# Patient Record
Sex: Female | Born: 1990 | Race: White | Hispanic: No | Marital: Married | Smoking: Never smoker
Health system: Southern US, Community
[De-identification: ages and names within clinical notes are randomized; demographics above are authoritative.]

## PROBLEM LIST (undated history)

## (undated) ENCOUNTER — Inpatient Hospital Stay (HOSPITAL_COMMUNITY): Payer: Self-pay

## (undated) DIAGNOSIS — F419 Anxiety disorder, unspecified: Secondary | ICD-10-CM

## (undated) DIAGNOSIS — R7303 Prediabetes: Secondary | ICD-10-CM

## (undated) DIAGNOSIS — G43909 Migraine, unspecified, not intractable, without status migrainosus: Secondary | ICD-10-CM

## (undated) DIAGNOSIS — E282 Polycystic ovarian syndrome: Secondary | ICD-10-CM

## (undated) DIAGNOSIS — K219 Gastro-esophageal reflux disease without esophagitis: Secondary | ICD-10-CM

## (undated) DIAGNOSIS — I4729 Other ventricular tachycardia: Secondary | ICD-10-CM

## (undated) DIAGNOSIS — E785 Hyperlipidemia, unspecified: Secondary | ICD-10-CM

## (undated) DIAGNOSIS — N809 Endometriosis, unspecified: Secondary | ICD-10-CM

## (undated) DIAGNOSIS — G4730 Sleep apnea, unspecified: Secondary | ICD-10-CM

## (undated) DIAGNOSIS — J039 Acute tonsillitis, unspecified: Secondary | ICD-10-CM

## (undated) DIAGNOSIS — Z8639 Personal history of other endocrine, nutritional and metabolic disease: Secondary | ICD-10-CM

## (undated) DIAGNOSIS — Z8742 Personal history of other diseases of the female genital tract: Secondary | ICD-10-CM

## (undated) DIAGNOSIS — G4733 Obstructive sleep apnea (adult) (pediatric): Secondary | ICD-10-CM

## (undated) DIAGNOSIS — Z973 Presence of spectacles and contact lenses: Secondary | ICD-10-CM

## (undated) DIAGNOSIS — I10 Essential (primary) hypertension: Secondary | ICD-10-CM

## (undated) DIAGNOSIS — F32A Depression, unspecified: Secondary | ICD-10-CM

## (undated) DIAGNOSIS — E039 Hypothyroidism, unspecified: Secondary | ICD-10-CM

## (undated) HISTORY — DX: Endometriosis, unspecified: N80.9

## (undated) HISTORY — DX: Gastro-esophageal reflux disease without esophagitis: K21.9

## (undated) HISTORY — DX: Sleep apnea, unspecified: G47.30

## (undated) HISTORY — DX: Essential (primary) hypertension: I10

## (undated) HISTORY — DX: Depression, unspecified: F32.A

## (undated) HISTORY — DX: Hyperlipidemia, unspecified: E78.5

---

## 2017-08-21 DIAGNOSIS — Q519 Congenital malformation of uterus and cervix, unspecified: Secondary | ICD-10-CM | POA: Insufficient documentation

## 2017-08-21 DIAGNOSIS — E282 Polycystic ovarian syndrome: Secondary | ICD-10-CM | POA: Insufficient documentation

## 2017-08-21 DIAGNOSIS — N946 Dysmenorrhea, unspecified: Secondary | ICD-10-CM | POA: Insufficient documentation

## 2017-09-27 HISTORY — PX: ROBOTIC ASSISTED DIAGNOSTIC LAPAROSCOPY: SHX6532

## 2018-04-22 DIAGNOSIS — R7989 Other specified abnormal findings of blood chemistry: Secondary | ICD-10-CM | POA: Insufficient documentation

## 2020-02-02 LAB — HM PAP SMEAR

## 2020-04-03 NOTE — L&D Delivery Note (Addendum)
Delivery Note:   Angelica Spencer G2P1001 at [redacted]w[redacted]d  Admitting diagnosis: Gestational hypertension [O13.9] Risks:  1. Gestational hypertension early term, no hx PEC 2. Hx hypothyroid stable off meds, serial TFT's wnl 3. Anxiety, stable on Celexa 40 mg 4. Rh negative, Rhogam given 06/28/20 5. BMI > 30 6. Hx uterine septum resection  First Stage:  Induction of labor: 09/02/2020 starting at 1830 Augmentation: AROM, Cytotec and IP Foley ROM: AROM @ 0420 clear fluid Active labor onset: 09/03/2020 @ 0415 Analgesia /Anesthesia/Pain control intrapartum: Epidural   Second Stage:  Complete dilation at 09/03/2020  @ 0540 Onset of pushing at 0608 FHR second stage Category I   SNM called to bedside pt feeling pressure and desires to push. With great meternal effort fetal head crowned up to +4 station. The following push fetal head delivered with ease, and shoulders and remaining fetal body delivered. Vigorously crying infant placed immediately S2S with mom. Pushing in lithotomy position with SNM, CNM and L&D staff support at bedside encouraging. FOB "Wilber Oliphant" present for birth and supportive.  Nuchal Cord: No  Delivery of a Live born female  Birth Weight: 3521 g Weight:, English: 7 lb 12.2 oz  APGAR: 9, 9  Newborn Delivery   Birth date/time: 09/03/2020 06:13:00 Delivery type: Vaginal, Spontaneous     Infant delivered in cephalic presentation, in DOA position andROA restituted to  position.  Cord double clamped after cessation of pulsation by SNM, cut by FOB "Caleb".  Collection of cord blood for typing completed. Cord blood donation-None  Arterial cord blood sample-No    Third Stage:  Placenta expressed with gentle cord traction and LUS massage-Intact 3 vessels  Uterine tone firm bleeding minimal without clots Uterotonics: IV pitocin Placenta to L&D for disposal.  None  laceration identified.  Episiotomy:None  Local analgesia: n/a  Repair:n/a Est. Blood Loss (mL):100.00    Complications: None   Mom to postpartum.  Baby boy "[name]" to Couplet care / Skin to Skin.   Delivery Report:  Review the Delivery Report for details.     Signed: Royetta Asal), BSN, RNC-OB, Student Nurse Midwife 09/03/2020, 6:46 AM  Medical screening examination/treatment/procedure(s) were conducted as a shared visit with non-physician practitioner(s) and myself.  I personally evaluated the patient during the encounter.   Neta Mends, CNM, MSN 09/03/2020, 10:13 PM

## 2020-05-12 DIAGNOSIS — F411 Generalized anxiety disorder: Secondary | ICD-10-CM | POA: Diagnosis not present

## 2020-06-23 DIAGNOSIS — Z3A27 27 weeks gestation of pregnancy: Secondary | ICD-10-CM | POA: Diagnosis not present

## 2020-06-23 DIAGNOSIS — Z3482 Encounter for supervision of other normal pregnancy, second trimester: Secondary | ICD-10-CM | POA: Diagnosis not present

## 2020-06-23 DIAGNOSIS — Z113 Encounter for screening for infections with a predominantly sexual mode of transmission: Secondary | ICD-10-CM | POA: Diagnosis not present

## 2020-06-23 DIAGNOSIS — Z2913 Encounter for prophylactic Rho(D) immune globulin: Secondary | ICD-10-CM | POA: Diagnosis not present

## 2020-06-23 DIAGNOSIS — Z23 Encounter for immunization: Secondary | ICD-10-CM | POA: Diagnosis not present

## 2020-06-23 DIAGNOSIS — Z114 Encounter for screening for human immunodeficiency virus [HIV]: Secondary | ICD-10-CM | POA: Diagnosis not present

## 2020-07-07 DIAGNOSIS — E039 Hypothyroidism, unspecified: Secondary | ICD-10-CM | POA: Insufficient documentation

## 2020-07-12 DIAGNOSIS — Z3483 Encounter for supervision of other normal pregnancy, third trimester: Secondary | ICD-10-CM | POA: Diagnosis not present

## 2020-07-12 DIAGNOSIS — Z8742 Personal history of other diseases of the female genital tract: Secondary | ICD-10-CM | POA: Insufficient documentation

## 2020-07-15 ENCOUNTER — Inpatient Hospital Stay (HOSPITAL_COMMUNITY)
Admission: AD | Admit: 2020-07-15 | Payer: BC Managed Care – PPO | Source: Home / Self Care | Admitting: Obstetrics and Gynecology

## 2020-07-28 DIAGNOSIS — E039 Hypothyroidism, unspecified: Secondary | ICD-10-CM | POA: Diagnosis not present

## 2020-07-28 DIAGNOSIS — Z3A32 32 weeks gestation of pregnancy: Secondary | ICD-10-CM | POA: Diagnosis not present

## 2020-07-28 DIAGNOSIS — O99283 Endocrine, nutritional and metabolic diseases complicating pregnancy, third trimester: Secondary | ICD-10-CM | POA: Diagnosis not present

## 2020-08-24 DIAGNOSIS — Z3685 Encounter for antenatal screening for Streptococcus B: Secondary | ICD-10-CM | POA: Diagnosis not present

## 2020-09-01 DIAGNOSIS — Z3480 Encounter for supervision of other normal pregnancy, unspecified trimester: Secondary | ICD-10-CM | POA: Diagnosis not present

## 2020-09-02 ENCOUNTER — Inpatient Hospital Stay (HOSPITAL_COMMUNITY)
Admission: AD | Admit: 2020-09-02 | Discharge: 2020-09-04 | DRG: 807 | Disposition: A | Discharge status: Home / Self Care | Payer: BC Managed Care – PPO | Attending: Obstetrics and Gynecology | Admitting: Obstetrics and Gynecology

## 2020-09-02 ENCOUNTER — Inpatient Hospital Stay (HOSPITAL_COMMUNITY): Payer: BC Managed Care – PPO | Admitting: Anesthesiology

## 2020-09-02 ENCOUNTER — Encounter (HOSPITAL_COMMUNITY): Payer: Self-pay | Admitting: Obstetrics and Gynecology

## 2020-09-02 ENCOUNTER — Other Ambulatory Visit: Payer: Self-pay

## 2020-09-02 DIAGNOSIS — O134 Gestational [pregnancy-induced] hypertension without significant proteinuria, complicating childbirth: Principal | ICD-10-CM | POA: Diagnosis present

## 2020-09-02 DIAGNOSIS — Z3A37 37 weeks gestation of pregnancy: Secondary | ICD-10-CM

## 2020-09-02 DIAGNOSIS — O26893 Other specified pregnancy related conditions, third trimester: Secondary | ICD-10-CM | POA: Diagnosis not present

## 2020-09-02 DIAGNOSIS — Z23 Encounter for immunization: Secondary | ICD-10-CM | POA: Diagnosis not present

## 2020-09-02 DIAGNOSIS — O99344 Other mental disorders complicating childbirth: Secondary | ICD-10-CM | POA: Diagnosis present

## 2020-09-02 DIAGNOSIS — Z6791 Unspecified blood type, Rh negative: Secondary | ICD-10-CM | POA: Diagnosis present

## 2020-09-02 DIAGNOSIS — F419 Anxiety disorder, unspecified: Secondary | ICD-10-CM | POA: Diagnosis present

## 2020-09-02 DIAGNOSIS — O139 Gestational [pregnancy-induced] hypertension without significant proteinuria, unspecified trimester: Secondary | ICD-10-CM | POA: Diagnosis present

## 2020-09-02 DIAGNOSIS — Z20822 Contact with and (suspected) exposure to covid-19: Secondary | ICD-10-CM | POA: Diagnosis not present

## 2020-09-02 DIAGNOSIS — O99824 Streptococcus B carrier state complicating childbirth: Secondary | ICD-10-CM | POA: Diagnosis not present

## 2020-09-02 DIAGNOSIS — R030 Elevated blood-pressure reading, without diagnosis of hypertension: Secondary | ICD-10-CM | POA: Diagnosis not present

## 2020-09-02 DIAGNOSIS — O133 Gestational [pregnancy-induced] hypertension without significant proteinuria, third trimester: Secondary | ICD-10-CM | POA: Diagnosis not present

## 2020-09-02 DIAGNOSIS — Z3490 Encounter for supervision of normal pregnancy, unspecified, unspecified trimester: Secondary | ICD-10-CM | POA: Diagnosis present

## 2020-09-02 DIAGNOSIS — Z412 Encounter for routine and ritual male circumcision: Secondary | ICD-10-CM | POA: Diagnosis not present

## 2020-09-02 LAB — TYPE AND SCREEN
ABO/RH(D): O NEG
Antibody Screen: NEGATIVE

## 2020-09-02 LAB — CBC
HCT: 35.4 % — ABNORMAL LOW (ref 36.0–46.0)
Hemoglobin: 11.3 g/dL — ABNORMAL LOW (ref 12.0–15.0)
MCH: 28.6 pg (ref 26.0–34.0)
MCHC: 31.9 g/dL (ref 30.0–36.0)
MCV: 89.6 fL (ref 80.0–100.0)
Platelets: 240 10*3/uL (ref 150–400)
RBC: 3.95 MIL/uL (ref 3.87–5.11)
RDW: 14.1 % (ref 11.5–15.5)
WBC: 9.7 10*3/uL (ref 4.0–10.5)
nRBC: 0 % (ref 0.0–0.2)

## 2020-09-02 LAB — COMPREHENSIVE METABOLIC PANEL
ALT: 14 U/L (ref 0–44)
AST: 38 U/L (ref 15–41)
Albumin: 3 g/dL — ABNORMAL LOW (ref 3.5–5.0)
Alkaline Phosphatase: 129 U/L — ABNORMAL HIGH (ref 38–126)
Anion gap: 9 (ref 5–15)
BUN: 9 mg/dL (ref 6–20)
CO2: 23 mmol/L (ref 22–32)
Calcium: 9.6 mg/dL (ref 8.9–10.3)
Chloride: 102 mmol/L (ref 98–111)
Creatinine, Ser: 0.73 mg/dL (ref 0.44–1.00)
GFR, Estimated: >60 mL/min (ref >60)
Glucose, Bld: 79 mg/dL (ref 70–99)
Potassium: 4.5 mmol/L (ref 3.5–5.1)
Sodium: 134 mmol/L — ABNORMAL LOW (ref 135–145)
Total Bilirubin: 0.6 mg/dL (ref 0.3–1.2)
Total Protein: 6.7 g/dL (ref 6.5–8.1)

## 2020-09-02 LAB — RESP PANEL BY RT-PCR (FLU A&B, COVID) ARPGX2
Influenza A by PCR: NEGATIVE
Influenza B by PCR: NEGATIVE
SARS Coronavirus 2 by RT PCR: NEGATIVE

## 2020-09-02 LAB — PROTEIN / CREATININE RATIO, URINE
Creatinine, Urine: 213.32 mg/dL
Protein Creatinine Ratio: 0.03 mg/mg{Cre} (ref 0.00–0.15)
Total Protein, Urine: 7 mg/dL

## 2020-09-02 MED ORDER — NIFEDIPINE 10 MG PO CAPS: 20.0000 mg | ORAL_CAPSULE | ORAL | Status: DC | PRN

## 2020-09-02 MED ORDER — FENTANYL-BUPIVACAINE-NACL 0.5-0.125-0.9 MG/250ML-% EP SOLN
12.0000 mL/h | EPIDURAL | Status: DC | PRN
  Administered 2020-09-03: 12 mL/h via EPIDURAL
  Filled 2020-09-02: qty 250

## 2020-09-02 MED ORDER — SODIUM CHLORIDE 0.9 % IV SOLN
5.0000 10*6.[IU] | Freq: Once | INTRAVENOUS | Status: AC
  Administered 2020-09-02: 5 10*6.[IU] via INTRAVENOUS
  Filled 2020-09-02: qty 5

## 2020-09-02 MED ORDER — ONDANSETRON HCL 4 MG/2ML IJ SOLN
4.0000 mg | Freq: Four times a day (QID) | INTRAMUSCULAR | Status: DC | PRN
  Administered 2020-09-03: 4 mg via INTRAVENOUS
  Filled 2020-09-02: qty 2

## 2020-09-02 MED ORDER — LACTATED RINGERS IV SOLN
500.0000 mL | INTRAVENOUS | Status: DC | PRN
  Administered 2020-09-03: 500 mL via INTRAVENOUS

## 2020-09-02 MED ORDER — OXYTOCIN 10 UNIT/ML IJ SOLN: 10.0000 [IU] | Freq: Once | INTRAMUSCULAR | Status: DC

## 2020-09-02 MED ORDER — PHENYLEPHRINE 40 MCG/ML (10ML) SYRINGE FOR IV PUSH (FOR BLOOD PRESSURE SUPPORT): 80.0000 ug | PREFILLED_SYRINGE | INTRAVENOUS | Status: DC | PRN

## 2020-09-02 MED ORDER — MISOPROSTOL 50MCG HALF TABLET
50.0000 ug | ORAL_TABLET | ORAL | Status: DC | PRN
  Administered 2020-09-02: 50 ug via BUCCAL
  Filled 2020-09-02: qty 1

## 2020-09-02 MED ORDER — OXYTOCIN-SODIUM CHLORIDE 30-0.9 UT/500ML-% IV SOLN
2.5000 [IU]/h | INTRAVENOUS | Status: DC
  Administered 2020-09-03: 2.5 [IU]/h via INTRAVENOUS
  Filled 2020-09-02: qty 500

## 2020-09-02 MED ORDER — EPHEDRINE 5 MG/ML INJ: 10.0000 mg | INTRAVENOUS | Status: DC | PRN

## 2020-09-02 MED ORDER — LIDOCAINE HCL (PF) 1 % IJ SOLN: 30.0000 mL | INTRAMUSCULAR | Status: DC | PRN

## 2020-09-02 MED ORDER — LACTATED RINGERS IV SOLN
500.0000 mL | Freq: Once | INTRAVENOUS | Status: AC
  Administered 2020-09-02: 500 mL via INTRAVENOUS

## 2020-09-02 MED ORDER — PENICILLIN G POT IN DEXTROSE 60000 UNIT/ML IV SOLN
3.0000 10*6.[IU] | INTRAVENOUS | Status: DC
  Administered 2020-09-03 (×2): 3 10*6.[IU] via INTRAVENOUS
  Filled 2020-09-02 (×2): qty 50

## 2020-09-02 MED ORDER — OXYTOCIN BOLUS FROM INFUSION
333.0000 mL | Freq: Once | INTRAVENOUS | Status: AC
  Administered 2020-09-03: 333 mL via INTRAVENOUS

## 2020-09-02 MED ORDER — TERBUTALINE SULFATE 1 MG/ML IJ SOLN: 0.2500 mg | Freq: Once | INTRAMUSCULAR | Status: DC | PRN

## 2020-09-02 MED ORDER — SOD CITRATE-CITRIC ACID 500-334 MG/5ML PO SOLN
30.0000 mL | ORAL | Status: DC | PRN
  Administered 2020-09-02 – 2020-09-03 (×2): 30 mL via ORAL
  Filled 2020-09-02 (×2): qty 15

## 2020-09-02 MED ORDER — NIFEDIPINE 10 MG PO CAPS
10.0000 mg | ORAL_CAPSULE | ORAL | Status: DC | PRN
  Administered 2020-09-02: 10 mg via ORAL
  Filled 2020-09-02: qty 1

## 2020-09-02 MED ORDER — LACTATED RINGERS IV SOLN: INTRAVENOUS | Status: DC

## 2020-09-02 MED ORDER — LABETALOL HCL 5 MG/ML IV SOLN
40.0000 mg | INTRAVENOUS | Status: DC | PRN
  Administered 2020-09-03: 40 mg via INTRAVENOUS
  Filled 2020-09-02: qty 8

## 2020-09-02 MED ORDER — ACETAMINOPHEN 325 MG PO TABS: 650.0000 mg | ORAL_TABLET | ORAL | Status: DC | PRN

## 2020-09-02 MED ORDER — DIPHENHYDRAMINE HCL 50 MG/ML IJ SOLN: 12.5000 mg | INTRAMUSCULAR | Status: DC | PRN

## 2020-09-02 NOTE — MAU Note (Signed)
Pt reports high blood pressure in the office and was told to come to MAU for further evaluation.

## 2020-09-02 NOTE — H&P (Signed)
OB ADMISSION/ HISTORY & PHYSICAL:  Admission Date: 09/02/2020  6:01 PM  Admit Diagnosis: high BP    Angelica Spencer is a  30 y.o. female presenting for evaluation in MAU for HA with elevated BP in office today and at home last night.  Reports HA this noon which improved after taking APAP 1gm to 2/10, feels like a dull generalized HA. Has hx of migraines. No visual changes or RUQ pain, no N/V.  + FM, no LOF/VB, no ctx.  BP at home last night 150's/90's, which was reproduced in office today. PEC labs drawn and pending in office. .  Expecting baby boy Angelica Spencer, plans to bottlefeed, desires epidural in active labor, spouse Angelica Spencer present and supportive.   Prenatal History: G2P1001   EDC : 09/22/2020, by [facility]  Prenatal care at [facility] since [redacted]w[redacted]d, transfer from [place], good prenatal care throughout, primary Sigmon, CNM   Prenatal course complicated by: 1. Gestational hypertension early term, no hx PEC 2. Hx hypothyroid stable off meds, serial TFT's wnl 3. Anxiety, stable on Celexa 40 mg 4. Rh negative, Rhogam given 06/28/20 5. BMI > 30 6. Hx uterine septum resection  Prenatal Labs: ABO, Rh:   O neg Antibody:  neg Rubella:   imm RPR:   neg HBsAg:   neg HIV:   neg GBS:   pos 1 hr Glucola : 111 Genetic Screening: declined Ultrasound: normal anatomy, XY, AGA,  pelvis proven 7'7"  Vaccines: TDaP           UTD         Flu             UTD                    COVID-19 UTD    Maternal Diabetes: No Genetic Screening: Declined Maternal Ultrasounds/Referrals: Normal Fetal Ultrasounds or other Referrals:  None Maternal Substance Abuse:  No Significant Maternal Medications:  Celexa Significant Maternal Lab Results:  Group B Strep positive Other Comments:  None  Medical / Surgical History :  Past medical history: History reviewed. No pertinent past medical history.   Past surgical history: History reviewed. No pertinent surgical history.   Family History: History reviewed. No pertinent family history.   Social History:  reports that she has never smoked. She has never used smokeless tobacco. She reports that she does not drink alcohol and does not use drugs.  Allergies: Imitrex [sumatriptan]   Current Medications at time of admission:  PNV, Celexa, Pepcid   Review of Systems: ROS As noted above  Physical Exam: Vital signs and nursing notes reviewed.  Patient Vitals for the past 24 hrs:  BP Temp Temp src Pulse Resp SpO2 Height Weight  09/02/20 1834 (!) 151/118 -- -- 100 -- 99 % -- --  09/02/20 1820 (!) 159/108 -- -- 91 -- 100 % -- --  09/02/20 1817 -- 98.6 F (37 C) Oral -- 20 100 % -- --  09/02/20 1813 -- -- -- -- -- -- 5\' 3"  (1.6 m) 100.2 kg     General: AAO x 3, NAD Heart: RRR Lungs:CTAB Abdomen: Gravid, NT, Leopold's vertex, fetal spine to maternal R Extremities: no edema Genitalia / VE: Dilation: 2.5 Effacement (%): 60 Station: -2 Presentation: Vertex Exam by:: [email or web address], CNM  Membranes sweep done  FHR: 130 BPM, moderate variability, + accels, no decels TOCO: Ctx none  Labs:   Pending T&S, CBC, RPR, CMP, PCR     Assessment:  30 y.o. G2P1001 at [redacted]w[redacted]d Gestational hypertension, labile BP to severe range, HA present but unclear if related to migrainous condition, no other neural sx  Recommend IOL and patient agrees.   1. Favorable cervix, NIL 2.  FHR category 1 3. GBS pos 4. Desires epidural in labor 5. Breastfeeding 6. Rh neg 7. Anxiety stable on Celexa 8. Placenta disposal per patient request  Plan: 1. Admit to BS for IOL, Nifedipine antihypertensive protocol 2. Routine L&D orders and PEC labs, GBS prophylaxis in active labor 3. Analgesia/anesthesia PRN  4. IF and buccal Cytotec for ripening, Pitocin PRN 5. Anticipate NSVB 6. Rhogam PRN postpartum 7. Continue Celexa as established  Dr Amado Nash notified of admission / plan of care   Neta Mends CNM, MSN 09/02/2020, 6:52 PM

## 2020-09-03 ENCOUNTER — Encounter (HOSPITAL_COMMUNITY): Payer: Self-pay | Admitting: Obstetrics and Gynecology

## 2020-09-03 DIAGNOSIS — Z6791 Unspecified blood type, Rh negative: Secondary | ICD-10-CM | POA: Diagnosis present

## 2020-09-03 DIAGNOSIS — Z3490 Encounter for supervision of normal pregnancy, unspecified, unspecified trimester: Secondary | ICD-10-CM | POA: Diagnosis present

## 2020-09-03 LAB — CBC WITH DIFFERENTIAL/PLATELET
Abs Immature Granulocytes: 0.09 10*3/uL — ABNORMAL HIGH (ref 0.00–0.07)
Basophils Absolute: 0 10*3/uL (ref 0.0–0.1)
Basophils Relative: 0 %
Eosinophils Absolute: 0 10*3/uL (ref 0.0–0.5)
Eosinophils Relative: 0 %
HCT: 32 % — ABNORMAL LOW (ref 36.0–46.0)
Hemoglobin: 10.5 g/dL — ABNORMAL LOW (ref 12.0–15.0)
Immature Granulocytes: 1 %
Lymphocytes Relative: 7 %
Lymphs Abs: 1.3 10*3/uL (ref 0.7–4.0)
MCH: 28.3 pg (ref 26.0–34.0)
MCHC: 32.8 g/dL (ref 30.0–36.0)
MCV: 86.3 fL (ref 80.0–100.0)
Monocytes Absolute: 1.1 10*3/uL — ABNORMAL HIGH (ref 0.1–1.0)
Monocytes Relative: 6 %
Neutro Abs: 16.3 10*3/uL — ABNORMAL HIGH (ref 1.7–7.7)
Neutrophils Relative %: 86 %
Platelets: 191 10*3/uL (ref 150–400)
RBC: 3.71 MIL/uL — ABNORMAL LOW (ref 3.87–5.11)
RDW: 14 % (ref 11.5–15.5)
WBC: 18.9 10*3/uL — ABNORMAL HIGH (ref 4.0–10.5)
nRBC: 0 % (ref 0.0–0.2)

## 2020-09-03 LAB — RPR: RPR Ser Ql: NONREACTIVE

## 2020-09-03 MED ORDER — FLEET ENEMA 7-19 GM/118ML RE ENEM: 1.0000 | ENEMA | Freq: Every day | RECTAL | Status: DC | PRN

## 2020-09-03 MED ORDER — TETANUS-DIPHTH-ACELL PERTUSSIS 5-2.5-18.5 LF-MCG/0.5 IM SUSY: 0.5000 mL | PREFILLED_SYRINGE | Freq: Once | INTRAMUSCULAR | Status: DC

## 2020-09-03 MED ORDER — DIPHENHYDRAMINE HCL 25 MG PO CAPS
25.0000 mg | ORAL_CAPSULE | Freq: Four times a day (QID) | ORAL | Status: DC | PRN
Start: 2020-09-03 — End: 2020-09-04

## 2020-09-03 MED ORDER — LABETALOL HCL 5 MG/ML IV SOLN: 80.0000 mg | INTRAVENOUS | Status: DC | PRN

## 2020-09-03 MED ORDER — CITALOPRAM HYDROBROMIDE 20 MG PO TABS
40.0000 mg | ORAL_TABLET | Freq: Every day | ORAL | Status: DC
  Administered 2020-09-03: 40 mg via ORAL
  Filled 2020-09-03: qty 1
  Filled 2020-09-03 (×2): qty 2

## 2020-09-03 MED ORDER — LABETALOL HCL 5 MG/ML IV SOLN: 40.0000 mg | INTRAVENOUS | Status: DC | PRN

## 2020-09-03 MED ORDER — IBUPROFEN 600 MG PO TABS
600.0000 mg | ORAL_TABLET | Freq: Four times a day (QID) | ORAL | Status: DC
  Administered 2020-09-03 – 2020-09-04 (×4): 600 mg via ORAL
  Filled 2020-09-03 (×5): qty 1

## 2020-09-03 MED ORDER — LABETALOL HCL 5 MG/ML IV SOLN: 20.0000 mg | INTRAVENOUS | Status: DC | PRN

## 2020-09-03 MED ORDER — ONDANSETRON HCL 4 MG PO TABS: 4.0000 mg | ORAL_TABLET | ORAL | Status: DC | PRN

## 2020-09-03 MED ORDER — SENNOSIDES-DOCUSATE SODIUM 8.6-50 MG PO TABS
2.0000 | ORAL_TABLET | ORAL | Status: DC
  Administered 2020-09-03 – 2020-09-04 (×2): 2 via ORAL
  Filled 2020-09-03 (×2): qty 2

## 2020-09-03 MED ORDER — SIMETHICONE 80 MG PO CHEW
80.0000 mg | CHEWABLE_TABLET | ORAL | Status: DC | PRN
  Filled 2020-09-03: qty 1

## 2020-09-03 MED ORDER — ACETAMINOPHEN 500 MG PO TABS
1000.0000 mg | ORAL_TABLET | Freq: Four times a day (QID) | ORAL | Status: DC
  Administered 2020-09-03 – 2020-09-04 (×4): 1000 mg via ORAL
  Filled 2020-09-03 (×4): qty 2

## 2020-09-03 MED ORDER — HYDRALAZINE HCL 20 MG/ML IJ SOLN: 10.0000 mg | INTRAMUSCULAR | Status: DC | PRN

## 2020-09-03 MED ORDER — LIDOCAINE HCL (PF) 1 % IJ SOLN
INTRAMUSCULAR | Status: DC | PRN
  Administered 2020-09-03 (×2): 4 mL via EPIDURAL

## 2020-09-03 MED ORDER — DIBUCAINE (PERIANAL) 1 % EX OINT: 1.0000 "application " | TOPICAL_OINTMENT | CUTANEOUS | Status: DC | PRN

## 2020-09-03 MED ORDER — COCONUT OIL OIL: 1.0000 "application " | TOPICAL_OIL | Status: DC | PRN

## 2020-09-03 MED ORDER — NIFEDIPINE ER OSMOTIC RELEASE 30 MG PO TB24: 30.0000 mg | ORAL_TABLET | Freq: Every day | ORAL | Status: DC

## 2020-09-03 MED ORDER — PRENATAL MULTIVITAMIN CH
1.0000 | ORAL_TABLET | Freq: Every day | ORAL | Status: DC
  Administered 2020-09-03 – 2020-09-04 (×2): 1 via ORAL
  Filled 2020-09-03 (×2): qty 1

## 2020-09-03 MED ORDER — BISACODYL 10 MG RE SUPP
10.0000 mg | Freq: Every day | RECTAL | Status: DC | PRN
  Filled 2020-09-03: qty 1

## 2020-09-03 MED ORDER — WITCH HAZEL-GLYCERIN EX PADS: 1.0000 "application " | MEDICATED_PAD | CUTANEOUS | Status: DC | PRN

## 2020-09-03 MED ORDER — ONDANSETRON HCL 4 MG/2ML IJ SOLN: 4.0000 mg | INTRAMUSCULAR | Status: DC | PRN

## 2020-09-03 MED ORDER — NIFEDIPINE ER OSMOTIC RELEASE 30 MG PO TB24
30.0000 mg | ORAL_TABLET | Freq: Every day | ORAL | Status: DC
  Administered 2020-09-03 – 2020-09-04 (×2): 30 mg via ORAL
  Filled 2020-09-03 (×3): qty 1

## 2020-09-03 MED ORDER — ZOLPIDEM TARTRATE 5 MG PO TABS: 5.0000 mg | ORAL_TABLET | Freq: Every evening | ORAL | Status: DC | PRN

## 2020-09-03 MED ORDER — BENZOCAINE-MENTHOL 20-0.5 % EX AERO
1.0000 "application " | INHALATION_SPRAY | CUTANEOUS | Status: DC | PRN
  Filled 2020-09-03: qty 56

## 2020-09-03 MED ORDER — FAMOTIDINE 20 MG PO TABS
20.0000 mg | ORAL_TABLET | Freq: Two times a day (BID) | ORAL | Status: DC
  Administered 2020-09-03 – 2020-09-04 (×3): 20 mg via ORAL
  Filled 2020-09-03 (×3): qty 1

## 2020-09-03 NOTE — Lactation Note (Signed)
This note was copied from a baby's chart. Lactation Consultation Note  Patient Name: Angelica Spencer [id number] Date: 09/03/2020 Reason for consult: Other (Comment);Initial assessment (on the [facility] Epic board mom was yes to see LC/ LC unavailable until mom was transferred. per mom plans to just formula feed , no pumping and requested from [facility] how to dry her milk up. LC reviewed ways to dry milk up.) Age:28 hours  Maternal Data Does the patient have breastfeeding experience prior to this delivery?: Yes How long did the patient breastfeed?: per mom had a diiffcult time breast feeding and only wants to bottefeed this time.  Feeding Mother's Current Feeding Choice: Formula Nipple Type: Slow - flow  LATCH Score                    Lactation Tools Discussed/Used    Interventions    Discharge    Consult Status Consult Status: Complete Date: 09/03/20    Kathrin Greathouse 09/03/2020, 12:29 PM

## 2020-09-03 NOTE — Progress Notes (Signed)
S: Doing well, pain controlled well with epidural. Feeling some intermittent pelvic and rectal pressure. Happy to be able to rest in between contractions. Denies HA/BVN/V or epigastric pain.    O: Vitals:   09/03/20 0341 09/03/20 0345 09/03/20 0350 09/03/20 0431  BP:    126/74  Pulse:    99  Resp:      Temp:      TempSrc:      SpO2: 99% 99% 98%   Weight:      Height:         FHT:  FHR: 135 bpm, variability: moderate,  accelerations:  Present,  decelerations:  Absent UC:   regular, every 1-3 minutes, moderate to palpation. SVE:   Dilation: 6 Effacement (%): 90 Station: 0 Exam by:: Dorathy Daft, SNM  Procedure: AROM with aminohook by SNM @0420 . Small amount of clear fluid noted.   A / P: Induction of labor due to gestational hypertension,  progressing well s/p IP foley and foley. AROM @0420  clear fluid. Blood pressures continue to be labile s/p epidural.   -Monitor BP for worsening sx of PEC.  Fetal Wellbeing:  Category I and Category II moderate variability with accels and early decelerations.   -Monitor for sx of distress.  Pain Control:  Epidural GBS Positive-PCN prophylaxis x2 doses.  Anticipated MOD:  NSVD   Moraima Burd ) , BSN, RNC-OB  Student Nurse-Midwife   09/03/2020  4:43 AM

## 2020-09-03 NOTE — Progress Notes (Addendum)
Progress Note                                               Angelica Spencer is a 30 y.o. G2P1001 at [redacted]w[redacted]d by ultrasound admitted for gHTN Progressing normally  Subjective:  Pt states she wants a nap now that she in comfortable with her epidural. She denies any pelciv pain or pressure at this time. Denies neural symptoms, BV/N/V and epidgastic pain.   Objective: Vitals:   09/03/20 0025 09/03/20 0030 09/03/20 0035 09/03/20 0037  BP: (!) 138/100 (!) 142/95  (!) 135/94  Pulse: (!) 101 (!) 104  (!) 102  Resp:      Temp:      TempSrc:      SpO2: 99% 99% 99%   Weight:      Height:        FHT:  FHR: 140 bpm, variability: moderate,  accelerations:  Present,  decelerations:  Absent UC:   regular, every 2-3 minutes, moderated to palpation. SVE:   Dilation: 5 Effacement (%): 70 Station: -1,0 Exam by:: [employer]  Labs:   Recent Labs    09/02/20 1822  WBC 9.7  HGB 11.3*  HCT 35.4*  PLT 240    CMP Latest Ref Rng & Units 09/02/2020  Glucose 70 - 99 mg/dL 79  BUN 6 - 20 mg/dL 9  Creatinine 9.23 - 3.00 mg/dL 7.62  Sodium 263 - 335 mmol/L 134(L)  Potassium 3.5 - 5.1 mmol/L 4.5  Chloride 98 - 111 mmol/L 102  CO2 22 - 32 mmol/L 23  Calcium 8.9 - 10.3 mg/dL 9.6  Total Protein 6.5 - 8.1 g/dL 6.7  Total Bilirubin 0.3 - 1.2 mg/dL 0.6  Alkaline Phos 38 - 126 U/L 129(H)  AST 15 - 41 U/L 38  ALT 0 - 44 U/L 14    Assessment / Plan: G2P1001 30 y.o. [redacted]w[redacted]d Induction of labor due to gestational hypertension. S/p Inpatient FB and cytotec x1 progressing well. Blood pressures continue to be labile. PIH labs normal.   Labor: Progressing normally Preeclampsia:  labs stable, monitor for sx of progression to PEC.  Fetal Wellbeing:  Category I Pain Control:  Epidural I/D:  GBS positive-Continue PCN prophylaxis  Anticipated MOD:  NSVD  Rhogam injection to be given prior to d/c PRN.    Dom Haverland Danella Deis) Suzie Portela, BSN, RNC-OB  Student Nurse-Midwife   09/03/2020  1:12 AM

## 2020-09-03 NOTE — Progress Notes (Signed)
PPD #0 INTERVAL NOTE  S/P NSVD  Live born female  Birth Weight: 7 lb 12.2 oz (3521 g) APGAR: 9, 9  Newborn Delivery   Birth date/time: 09/03/2020 06:13:00 Delivery type: Vaginal, Spontaneous     Baby name: [name] Delivering provider: Carlynn Herald  Episiotomy:None   Lacerations:None   Feeding: breast  Pain control at delivery: Epidural   S:  Reports feeling well. Denies HA, epigastric pain, or visual changes. Husband at the bedside providing support.              Tolerating PO/No nausea or vomiting             Bleeding is moderate             Pain controlled with acetaminophen and ibuprofen (OTC)             Up ad lib/ambulatory/voiding without difficulties   O:  A & O x 3, in no apparent distress              VS:  Vitals:   09/03/20 0845 09/03/20 0900 09/03/20 0935 09/03/20 1035  BP: (!) 139/97 138/87 128/81 118/74  Pulse: 84 82 72 74  Resp: 18  18 16   Temp:   98.3 F (36.8 C) 98.1 F (36.7 C)  TempSrc:   Oral Oral  SpO2: 99%  98% 98%  Weight:      Height:        LABS:  Recent Labs    09/02/20 1822 09/03/20 0905  WBC 9.7 18.9*  HGB 11.3* 10.5*  HCT 35.4* 32.0*  PLT 240 191    Blood type: --/--/O NEG (06/02 1830)  Rubella:     I&O: I/O last 3 completed shifts: In: -  Out: 800 [Urine:700; Blood:100]          No intake/output data recorded.  Vaccines: TDaP          UTD                    COVID-19 UTD   Gen: AAO x 3, NAD  Abdomen: soft, non-tender, non-distended             Fundus: firm, non-tender, U-1  Perineum: intact  Lochia: moderate  Extremities: no edema, no calf pain or tenderness   A/P:  PPD #0 30 y.o., 34  Principal Problem:   Postpartum care following vaginal delivery 6/3  Doing well - stable status  Routine post partum orders Active Problems:   Gestational hypertension  Severe range BP following delivery, treated with Labetalol 40mg  IV  BP WNL since IV Labetalol  Labs unremarkable  Will start PO Procardia 30mg  XL  daily  F/U in 1 week postpartum for BP check   Encounter for induction of labor   SVD (spontaneous vaginal delivery)   RhD negative  Baby O POS  Rhogam prior to discharge  Anticipate discharge tomorrow.   8/3, MSN, CNM 09/03/2020, 11:10 AM

## 2020-09-03 NOTE — Anesthesia Procedure Notes (Signed)
Epidural Patient location during procedure: OB Start time: 09/02/2020 11:56 PM End time: 09/03/2020 12:00 AM  Staffing Anesthesiologist: Kaylyn Layer, MD Performed: anesthesiologist   Preanesthetic Checklist Completed: patient identified, IV checked, risks and benefits discussed, monitors and equipment checked, pre-op evaluation and timeout performed  Epidural Patient position: sitting Prep: DuraPrep and site prepped and draped Patient monitoring: continuous pulse ox, blood pressure and heart rate Approach: midline Location: L3-L4 Injection technique: LOR air  Needle:  Needle type: Tuohy  Needle gauge: 17 G Needle length: 9 cm Needle insertion depth: 7 cm Catheter type: closed end flexible Catheter size: 19 Gauge Catheter at skin depth: 12 cm Test dose: negative and Other (1% lidocaine)  Assessment Events: blood not aspirated, injection not painful, no injection resistance, no paresthesia and negative IV test  Additional Notes Patient identified. Risks, benefits, and alternatives discussed with patient including but not limited to bleeding, infection, nerve damage, paralysis, failed block, incomplete pain control, headache, blood pressure changes, nausea, vomiting, reactions to medication, itching, and postpartum back pain. Confirmed with bedside nurse the patient's most recent platelet count. Confirmed with patient that they are not currently taking any anticoagulation, have any bleeding history, or any family history of bleeding disorders. Patient expressed understanding and wished to proceed. All questions were answered. Sterile technique was used throughout the entire procedure. Please see nursing notes for vital signs.   2 attempts required, both at L3-4. Test dose was given through epidural catheter and negative prior to continuing to dose epidural or start infusion. Warning signs of high block given to the patient including shortness of breath, tingling/numbness in hands,  complete motor block, or any concerning symptoms with instructions to call for help. Patient was given instructions on fall risk and not to get out of bed. All questions and concerns addressed with instructions to call with any issues or inadequate analgesia.  Reason for block:procedure for pain

## 2020-09-03 NOTE — Progress Notes (Signed)
Dorisann Frames, CNM notified of BP's  Of 160/98, 158/100. 151/100, 153/97. 40mg  of Labetalol given per orders. Will continue to monitor. , RN

## 2020-09-03 NOTE — Social Work (Signed)
MOB was referred for history of anxiety.   * Referral screened out by Clinical Social Worker because none of the following criteria appear to apply:  ~ History of anxiety/depression during this pregnancy, or of post-partum depression following prior delivery. ~ Diagnosis of anxiety and/or depression within last 3 years. OR * MOB's symptoms currently being treated with medication and/or therapy. CSW reviewed chart and notes MOB currently stable on Celexa 40mg .   * MOB's symptoms currently being treated with medication and/or therapy. Please contact the Clinical Social Worker if needs arise, by [facility] request, or if MOB scores greater than 9/yes to question 10 on Edinburgh Postpartum Depression Screen.  06-09-1975, LCSWA Clinical Social Work [facility] [employer]  [phone]

## 2020-09-03 NOTE — Anesthesia Postprocedure Evaluation (Signed)
Anesthesia Post Note  Patient: Kindel Rochefort  Procedure(s) Performed: AN AD HOC LABOR EPIDURAL     Patient location during evaluation: Mother Baby Anesthesia Type: Epidural Level of consciousness: awake and alert, oriented and patient cooperative Pain management: pain level controlled Vital Signs Assessment: post-procedure vital signs reviewed and stable Respiratory status: spontaneous breathing Cardiovascular status: stable Postop Assessment: no headache, epidural receding, patient able to bend at knees and no signs of nausea or vomiting Anesthetic complications: no Comments: Pt. States she is walking.  Pain score 1.    No complications documented.  Last Vitals:  Vitals:   09/03/20 1035 09/03/20 1410  BP: 118/74 115/69  Pulse: 74 81  Resp: 16 16  Temp: 36.7 C 36.5 C  SpO2: 98% 98%    Last Pain:  Vitals:   09/03/20 1420  TempSrc:   PainSc: 0-No pain   Pain Goal: Patients Stated Pain Goal: 2 (09/03/20 1035)                 Merrilyn Puma

## 2020-09-03 NOTE — Anesthesia Preprocedure Evaluation (Signed)
Anesthesia Evaluation  Patient identified by MRN, date of birth, ID band Patient awake    Reviewed: Allergy & Precautions, Patient's Chart, lab work & pertinent test results  History of Anesthesia Complications Negative for: history of anesthetic complications  Airway Mallampati: II  TM Distance: >3 FB Neck ROM: Full    Dental no notable dental hx.    Pulmonary neg pulmonary ROS,    Pulmonary exam normal        Cardiovascular hypertension (gestational), Pt. on medications Normal cardiovascular exam     Neuro/Psych negative neurological ROS  negative psych ROS   GI/Hepatic negative GI ROS, Neg liver ROS,   Endo/Other  negative endocrine ROS  Renal/GU negative Renal ROS  negative genitourinary   Musculoskeletal negative musculoskeletal ROS (+)   Abdominal   Peds  Hematology  (+) anemia ,   Anesthesia Other Findings Day of surgery medications reviewed with patient.  Reproductive/Obstetrics (+) Pregnancy                             Anesthesia Physical Anesthesia Plan  ASA: II  Anesthesia Plan: Epidural   Post-op Pain Management:    Induction:   PONV Risk Score and Plan: Treatment may vary due to age or medical condition  Airway Management Planned: Natural Airway  Additional Equipment:   Intra-op Plan:   Post-operative Plan:   Informed Consent: I have reviewed the patients History and Physical, chart, labs and discussed the procedure including the risks, benefits and alternatives for the proposed anesthesia with the patient or authorized representative who has indicated his/her understanding and acceptance.       Plan Discussed with:   Anesthesia Plan Comments:         Anesthesia Quick Evaluation

## 2020-09-04 LAB — CBC
HCT: 29.9 % — ABNORMAL LOW (ref 36.0–46.0)
Hemoglobin: 9.7 g/dL — ABNORMAL LOW (ref 12.0–15.0)
MCH: 28.4 pg (ref 26.0–34.0)
MCHC: 32.4 g/dL (ref 30.0–36.0)
MCV: 87.4 fL (ref 80.0–100.0)
Platelets: 192 10*3/uL (ref 150–400)
RBC: 3.42 MIL/uL — ABNORMAL LOW (ref 3.87–5.11)
RDW: 14.3 % (ref 11.5–15.5)
WBC: 11.9 10*3/uL — ABNORMAL HIGH (ref 4.0–10.5)
nRBC: 0 % (ref 0.0–0.2)

## 2020-09-04 LAB — COMPREHENSIVE METABOLIC PANEL
ALT: 10 U/L (ref 0–44)
AST: 18 U/L (ref 15–41)
Albumin: 2.4 g/dL — ABNORMAL LOW (ref 3.5–5.0)
Alkaline Phosphatase: 99 U/L (ref 38–126)
Anion gap: 9 (ref 5–15)
BUN: 6 mg/dL (ref 6–20)
CO2: 24 mmol/L (ref 22–32)
Calcium: 8.4 mg/dL — ABNORMAL LOW (ref 8.9–10.3)
Chloride: 104 mmol/L (ref 98–111)
Creatinine, Ser: 0.67 mg/dL (ref 0.44–1.00)
GFR, Estimated: >60 mL/min (ref >60)
Glucose, Bld: 99 mg/dL (ref 70–99)
Potassium: 4.1 mmol/L (ref 3.5–5.1)
Sodium: 137 mmol/L (ref 135–145)
Total Bilirubin: 0.3 mg/dL (ref 0.3–1.2)
Total Protein: 5.4 g/dL — ABNORMAL LOW (ref 6.5–8.1)

## 2020-09-04 MED ORDER — NIFEDIPINE ER 30 MG PO TB24: 30.0000 mg | ORAL_TABLET | Freq: Every day | ORAL | 0 refills | Status: DC

## 2020-09-04 MED ORDER — ACETAMINOPHEN 500 MG PO TABS: 1000.0000 mg | ORAL_TABLET | Freq: Four times a day (QID) | ORAL | 0 refills | Status: DC | PRN

## 2020-09-04 MED ORDER — RHO D IMMUNE GLOBULIN 1500 UNIT/2ML IJ SOSY
300.0000 ug | PREFILLED_SYRINGE | Freq: Once | INTRAMUSCULAR | Status: AC
  Administered 2020-09-04: 300 ug via INTRAMUSCULAR
  Filled 2020-09-04: qty 2

## 2020-09-04 MED ORDER — IBUPROFEN 600 MG PO TABS: 600.0000 mg | ORAL_TABLET | Freq: Four times a day (QID) | ORAL | 0 refills | Status: DC | PRN

## 2020-09-04 NOTE — Discharge Summary (Signed)
Postpartum Discharge Summary  Date of Service updated 09/04/20     Patient Name: Angelica Spencer DOB: 06/01/1990 MRN: [id number]  Date of admission: 09/02/2020 Delivery date:09/03/2020  Delivering provider: Deloris Ping  Date of discharge: 09/04/2020  Admitting diagnosis: Gestational hypertension [O13.9] Intrauterine pregnancy: [redacted]w[redacted]d    Secondary diagnosis:  Principal Problem:   Postpartum care following vaginal delivery 6/3 Active Problems:   Gestational hypertension   Encounter for induction of labor   SVD (spontaneous vaginal delivery)   RhD negative  Additional problems: none    Discharge diagnosis: Term Pregnancy Delivered and Gestational Hypertension                                              Post partum procedures:rhogam Augmentation: AROM, Pitocin and IP Foley Complications: None  Hospital course: Induction of Labor With Vaginal Delivery   30y.o. yo G2P2002 at 318w2das admitted to the hospital 09/02/2020 for induction of labor.  Indication for induction: Gestational hypertension.  Patient had an uncomplicated labor course as follows: Membrane Rupture Time/Date: 4:20 AM ,09/03/2020   Delivery Method:Vaginal, Spontaneous  Episiotomy: None  Lacerations:  None  Details of delivery can be found in separate delivery note.  Patient had a routine postpartum course. Patient is discharged home 09/04/20.  Newborn Data: Birth date:09/03/2020  Birth time:6:13 AM  Gender:Female  Living status:Living  Apgars:9 ,9  Weight:3521 g   Magnesium Sulfate received: No BMZ received: No Rhophylac:Yes given postpartum day one 09/04/20 MMR:N/A T-DaP:Given prenatally  Physical exam  Vitals:   09/03/20 1410 09/03/20 1820 09/03/20 2252 09/04/20 0715  BP: 115/69 129/86 120/79 138/87  Pulse: 81 80 75 83  Resp: _0 Temp: 97.7 F (36.5 C) 98.7 F (37.1 C) 97.9 F (36.6 C) 98.9 F (37.2 C)  TempSrc: Oral Axillary Oral Oral  SpO2: 98% 98% 97% 99%  Weight:      Height:        General: alert and no distress  Heart: RRR Lungs: CTABL no rales no crackles Lochia: appropriate Uterine Fundus: firm Incision: N/A DVT Evaluation: No evidence of DVT seen on physical exam. Labs: Lab Results  Component Value Date   WBC 11.9 (H) 09/04/2020   HGB 9.7 (L) 09/04/2020   HCT 29.9 (L) 09/04/2020   MCV 87.4 09/04/2020   PLT 192 09/04/2020   CMP Latest Ref Rng & Units 09/04/2020  Glucose 70 - 99 mg/dL 99  BUN 6 - 20 mg/dL 6  Creatinine 0.44 - 1.00 mg/dL 0.67  Sodium 135 - 145 mmol/L 137  Potassium 3.5 - 5.1 mmol/L 4.1  Chloride 98 - 111 mmol/L 104  CO2 22 - 32 mmol/L 24  Calcium 8.9 - 10.3 mg/dL 8.4(L)  Total Protein 6.5 - 8.1 g/dL 5.4(L)  Total Bilirubin 0.3 - 1.2 mg/dL 0.3  Alkaline Phos 38 - 126 U/L 99  AST 15 - 41 U/L 18  ALT 0 - 44 U/L 10   Edinburgh Score: Edinburgh Postnatal Depression Scale Screening Tool 09/04/2020  I have been able to laugh and see the funny side of things. 0  I have looked forward with enjoyment to things. 0  I have blamed myself unnecessarily when things went wrong. 0  I have been anxious or worried for no good reason. 2  I have felt scared or panicky for no good reason.  0  Things have been getting on top of me. 1  I have been so unhappy that I have had difficulty sleeping. 0  I have felt sad or miserable. 0  I have been so unhappy that I have been crying. 0  The thought of harming myself has occurred to me. 0  Edinburgh Postnatal Depression Scale Total 3      After visit meds:  Allergies as of 09/04/2020      Reactions   Imitrex [sumatriptan]    Pt reports heart palpitations       Medication List    TAKE these medications   acetaminophen 500 MG tablet Commonly known as: TYLENOL Take 2 tablets (1,000 mg total) by mouth every 6 (six) hours as needed.   citalopram 40 MG tablet Commonly known as: CELEXA Take 40 mg by mouth daily.   famotidine 20 MG tablet Commonly known as: PEPCID Take 20 mg by mouth 2 (two) times  daily.   ibuprofen 600 MG tablet Commonly known as: ADVIL Take 1 tablet (600 mg total) by mouth every 6 (six) hours as needed.   NIFEdipine 30 MG 24 hr tablet Commonly known as: ADALAT CC Take 1 tablet (30 mg total) by mouth daily. Start taking on: September 05, 2020   ondansetron 4 MG tablet Commonly known as: ZOFRAN Take 4 mg by mouth every 8 (eight) hours as needed for nausea or vomiting.   prenatal multivitamin Tabs tablet Take 1 tablet by mouth daily at 12 noon.            Discharge Care Instructions  (From admission, onward)         Start     Ordered   09/04/20 0000  Discharge wound care:       Comments: Sitz baths 2 times /day with warm water x 1 week. May add herbals: 1 ounce dried comfrey leaf* 1 ounce calendula flowers 1 ounce lavender flowers  Supplies can be found online at [employer] sources at [employer], [employer]  1/2 ounce dried uva ursi leaves 1/2 ounce witch hazel blossoms (if you can find them) 1/2 ounce dried sage leaf 1/2 cup sea salt Directions: Bring 2 quarts of water to a boil. Turn off heat, and place 1 ounce (approximately 1 large handful) of the above mixed herbs (not the salt) into the pot. Steep, covered, for 30 minutes.  Strain the liquid well with a fine mesh strainer, and discard the herb material. Add 2 quarts of liquid to the tub, along with the 1/2 cup of salt. This medicinal liquid can also be made into compresses and peri-rinses.   09/04/20 1019           Discharge home in stable condition Infant Feeding: Bottle Infant Disposition:home with mother Discharge instruction: per After Visit Summary and Postpartum booklet. Activity: Advance as tolerated. Pelvic rest for 6 weeks.  Diet: routine diet Anticipated Birth Control: Unsure Postpartum Appointment:1 week and 6 weeks Additional Postpartum F/U: BP check 1 week Future Appointments:No future appointments. Follow up Visit: Patient to be seen within 1 week of  discharge at [facility] with Midwife provider for BP check.      09/04/2020 Torre Schaumburg A Cesiah Westley, DO

## 2020-09-05 LAB — RH IG WORKUP (INCLUDES ABO/RH)
Fetal Screen: NEGATIVE
Gestational Age(Wks): 37.2
Unit division: 0

## 2020-09-09 ENCOUNTER — Telehealth (HOSPITAL_COMMUNITY): Payer: Self-pay | Admitting: *Deleted

## 2020-09-09 ENCOUNTER — Other Ambulatory Visit: Payer: Self-pay

## 2020-09-09 DIAGNOSIS — O135 Gestational [pregnancy-induced] hypertension without significant proteinuria, complicating the puerperium: Secondary | ICD-10-CM | POA: Diagnosis not present

## 2020-09-09 DIAGNOSIS — O139 Gestational [pregnancy-induced] hypertension without significant proteinuria, unspecified trimester: Secondary | ICD-10-CM | POA: Diagnosis not present

## 2020-09-09 NOTE — Telephone Encounter (Signed)
Preadmission screen  

## 2020-09-13 ENCOUNTER — Other Ambulatory Visit (HOSPITAL_COMMUNITY): Payer: BC Managed Care – PPO

## 2020-09-14 ENCOUNTER — Other Ambulatory Visit: Payer: Self-pay

## 2020-09-15 ENCOUNTER — Inpatient Hospital Stay (HOSPITAL_COMMUNITY): Payer: BC Managed Care – PPO

## 2020-09-15 ENCOUNTER — Inpatient Hospital Stay (HOSPITAL_COMMUNITY)
Admission: AD | Admit: 2020-09-15 | Payer: BC Managed Care – PPO | Source: Home / Self Care | Admitting: Obstetrics and Gynecology

## 2020-09-15 ENCOUNTER — Inpatient Hospital Stay (HOSPITAL_COMMUNITY)
Admission: AD | Admit: 2020-09-15 | Payer: BC Managed Care – PPO | Source: Home / Self Care | Admitting: Obstetrics & Gynecology

## 2020-09-16 DIAGNOSIS — O135 Gestational [pregnancy-induced] hypertension without significant proteinuria, complicating the puerperium: Secondary | ICD-10-CM | POA: Diagnosis not present

## 2020-11-25 DIAGNOSIS — Z01419 Encounter for gynecological examination (general) (routine) without abnormal findings: Secondary | ICD-10-CM | POA: Diagnosis not present

## 2020-11-25 DIAGNOSIS — Z6835 Body mass index (BMI) 35.0-35.9, adult: Secondary | ICD-10-CM | POA: Diagnosis not present

## 2020-12-28 ENCOUNTER — Emergency Department (HOSPITAL_COMMUNITY): Payer: Self-pay

## 2020-12-28 ENCOUNTER — Encounter (HOSPITAL_COMMUNITY): Payer: Self-pay | Admitting: Emergency Medicine

## 2020-12-28 ENCOUNTER — Emergency Department (HOSPITAL_COMMUNITY)
Admission: EM | Admit: 2020-12-28 | Discharge: 2020-12-29 | Disposition: A | Discharge status: Home / Self Care | Payer: Self-pay | Attending: Emergency Medicine | Admitting: Emergency Medicine

## 2020-12-28 ENCOUNTER — Other Ambulatory Visit: Payer: Self-pay

## 2020-12-28 DIAGNOSIS — B9689 Other specified bacterial agents as the cause of diseases classified elsewhere: Secondary | ICD-10-CM | POA: Insufficient documentation

## 2020-12-28 DIAGNOSIS — R000 Tachycardia, unspecified: Secondary | ICD-10-CM | POA: Insufficient documentation

## 2020-12-28 DIAGNOSIS — N9489 Other specified conditions associated with female genital organs and menstrual cycle: Secondary | ICD-10-CM | POA: Insufficient documentation

## 2020-12-28 DIAGNOSIS — R1032 Left lower quadrant pain: Secondary | ICD-10-CM | POA: Insufficient documentation

## 2020-12-28 DIAGNOSIS — R509 Fever, unspecified: Secondary | ICD-10-CM | POA: Insufficient documentation

## 2020-12-28 DIAGNOSIS — R300 Dysuria: Secondary | ICD-10-CM | POA: Insufficient documentation

## 2020-12-28 DIAGNOSIS — R059 Cough, unspecified: Secondary | ICD-10-CM | POA: Insufficient documentation

## 2020-12-28 DIAGNOSIS — N760 Acute vaginitis: Secondary | ICD-10-CM | POA: Insufficient documentation

## 2020-12-28 DIAGNOSIS — J3489 Other specified disorders of nose and nasal sinuses: Secondary | ICD-10-CM | POA: Insufficient documentation

## 2020-12-28 DIAGNOSIS — R0981 Nasal congestion: Secondary | ICD-10-CM | POA: Insufficient documentation

## 2020-12-28 DIAGNOSIS — Z20822 Contact with and (suspected) exposure to covid-19: Secondary | ICD-10-CM | POA: Insufficient documentation

## 2020-12-28 HISTORY — DX: Migraine, unspecified, not intractable, without status migrainosus: G43.909

## 2020-12-28 LAB — COMPREHENSIVE METABOLIC PANEL
ALT: 20 U/L (ref 0–44)
AST: 17 U/L (ref 15–41)
Albumin: 4.1 g/dL (ref 3.5–5.0)
Alkaline Phosphatase: 81 U/L (ref 38–126)
Anion gap: 11 (ref 5–15)
BUN: 12 mg/dL (ref 6–20)
CO2: 24 mmol/L (ref 22–32)
Calcium: 9.2 mg/dL (ref 8.9–10.3)
Chloride: 101 mmol/L (ref 98–111)
Creatinine, Ser: 0.76 mg/dL (ref 0.44–1.00)
GFR, Estimated: >60 mL/min (ref >60)
Glucose, Bld: 113 mg/dL — ABNORMAL HIGH (ref 70–99)
Potassium: 3.9 mmol/L (ref 3.5–5.1)
Sodium: 136 mmol/L (ref 135–145)
Total Bilirubin: 0.8 mg/dL (ref 0.3–1.2)
Total Protein: 8.1 g/dL (ref 6.5–8.1)

## 2020-12-28 LAB — CBC WITH DIFFERENTIAL/PLATELET
Abs Immature Granulocytes: 0.02 10*3/uL (ref 0.00–0.07)
Basophils Absolute: 0 10*3/uL (ref 0.0–0.1)
Basophils Relative: 0 %
Eosinophils Absolute: 0.1 10*3/uL (ref 0.0–0.5)
Eosinophils Relative: 1 %
HCT: 40.3 % (ref 36.0–46.0)
Hemoglobin: 13.3 g/dL (ref 12.0–15.0)
Immature Granulocytes: 0 %
Lymphocytes Relative: 7 %
Lymphs Abs: 0.6 10*3/uL — ABNORMAL LOW (ref 0.7–4.0)
MCH: 29.1 pg (ref 26.0–34.0)
MCHC: 33 g/dL (ref 30.0–36.0)
MCV: 88.2 fL (ref 80.0–100.0)
Monocytes Absolute: 0.5 10*3/uL (ref 0.1–1.0)
Monocytes Relative: 5 %
Neutro Abs: 7.5 10*3/uL (ref 1.7–7.7)
Neutrophils Relative %: 87 %
Platelets: 203 10*3/uL (ref 150–400)
RBC: 4.57 MIL/uL (ref 3.87–5.11)
RDW: 14.2 % (ref 11.5–15.5)
WBC: 8.7 10*3/uL (ref 4.0–10.5)
nRBC: 0 % (ref 0.0–0.2)

## 2020-12-28 LAB — URINALYSIS, ROUTINE W REFLEX MICROSCOPIC
Bacteria, UA: NONE SEEN
Bilirubin Urine: NEGATIVE
Glucose, UA: NEGATIVE mg/dL
Ketones, ur: NEGATIVE mg/dL
Leukocytes,Ua: NEGATIVE
Nitrite: NEGATIVE
Protein, ur: NEGATIVE mg/dL
Specific Gravity, Urine: 1.015 (ref 1.005–1.030)
pH: 7 (ref 5.0–8.0)

## 2020-12-28 LAB — I-STAT BETA HCG BLOOD, ED (MC, WL, AP ONLY): I-stat hCG, quantitative: <5 m[IU]/mL (ref <5)

## 2020-12-28 LAB — RESP PANEL BY RT-PCR (FLU A&B, COVID) ARPGX2
Influenza A by PCR: NEGATIVE
Influenza B by PCR: NEGATIVE
SARS Coronavirus 2 by RT PCR: NEGATIVE

## 2020-12-28 LAB — LIPASE, BLOOD: Lipase: 28 U/L (ref 11–51)

## 2020-12-28 MED ORDER — SODIUM CHLORIDE 0.9 % IV BOLUS
1000.0000 mL | Freq: Once | INTRAVENOUS | Status: AC
  Administered 2020-12-28: 1000 mL via INTRAVENOUS

## 2020-12-28 MED ORDER — ACETAMINOPHEN 325 MG PO TABS
650.0000 mg | ORAL_TABLET | Freq: Once | ORAL | Status: AC
  Administered 2020-12-28: 650 mg via ORAL
  Filled 2020-12-28: qty 2

## 2020-12-28 MED ORDER — IOHEXOL 350 MG/ML SOLN
100.0000 mL | Freq: Once | INTRAVENOUS | Status: AC | PRN
  Administered 2020-12-28: 100 mL via INTRAVENOUS

## 2020-12-28 NOTE — ED Provider Notes (Signed)
Emergency Medicine Provider Triage Evaluation Note  Angelica Spencer , a 30 y.o. female  was evaluated in triage.  Pt complains of left lower abd pain and left flank pain that started earlier today.  Review of Systems  Positive: Left abd and left flank, fever, cough, congestion, dysuria Negative: vomiting  Physical Exam  BP (!) 144/95 (BP Location: Left Arm)   Pulse (!) 139   Temp (!) 100.6 F (38.1 C)   Resp 17   Ht 5\' 3"  (1.6 m)   Wt 90.7 kg   SpO2 99%   BMI 35.43 kg/m  Gen:   Awake, no distress   Resp:  Normal effort  MSK:   Moves extremities without difficulty  Other:  congested  Medical Decision Making  Medically screening exam initiated at 9:40 PM.  Appropriate orders placed.  Lailoni Baquera was informed that the remainder of the evaluation will be completed by another provider, this initial triage assessment does not replace that evaluation, and the importance of remaining in the ED until their evaluation is complete.     Greer Ee 12/28/20 2140    2141, MD 12/29/20 [phone]

## 2020-12-28 NOTE — ED Triage Notes (Signed)
Pt reports sudden onset of right sided abdominal pain that has progressed to right flank area since noon today. Also reports urinary sx, HA, and sinus congestions. Denies exposure to known sick person. Endorses nausea, no vomiting. Pt febrile in triage.

## 2020-12-29 ENCOUNTER — Emergency Department (HOSPITAL_COMMUNITY): Payer: Self-pay

## 2020-12-29 LAB — LACTIC ACID, PLASMA: Lactic Acid, Venous: 1 mmol/L (ref 0.5–1.9)

## 2020-12-29 LAB — WET PREP, GENITAL
Sperm: NONE SEEN
Trich, Wet Prep: NONE SEEN
Yeast Wet Prep HPF POC: NONE SEEN

## 2020-12-29 MED ORDER — METRONIDAZOLE 500 MG PO TABS: 500.0000 mg | ORAL_TABLET | Freq: Two times a day (BID) | ORAL | 0 refills | Status: AC

## 2020-12-29 MED ORDER — CEFTRIAXONE SODIUM 1 G IJ SOLR
1.0000 g | Freq: Once | INTRAMUSCULAR | Status: AC
  Administered 2020-12-29: 1 g via INTRAMUSCULAR
  Filled 2020-12-29: qty 10

## 2020-12-29 MED ORDER — DOXYCYCLINE HYCLATE 100 MG PO TABS
100.0000 mg | ORAL_TABLET | Freq: Once | ORAL | Status: AC
  Administered 2020-12-29: 100 mg via ORAL
  Filled 2020-12-29: qty 1

## 2020-12-29 MED ORDER — DOXYCYCLINE HYCLATE 100 MG PO CAPS
100.0000 mg | ORAL_CAPSULE | Freq: Two times a day (BID) | ORAL | 0 refills | Status: AC
Start: 2020-12-29 — End: 2021-01-08

## 2020-12-29 MED ORDER — HYDROCODONE-ACETAMINOPHEN 5-325 MG PO TABS: 2.0000 | ORAL_TABLET | ORAL | 0 refills | Status: DC | PRN

## 2020-12-29 MED ORDER — METRONIDAZOLE 500 MG PO TABS
500.0000 mg | ORAL_TABLET | Freq: Once | ORAL | Status: AC
  Administered 2020-12-29: 500 mg via ORAL
  Filled 2020-12-29: qty 1

## 2020-12-29 NOTE — Discharge Instructions (Addendum)
Your ultrasound showed a cyst on your left side.  This may be the cause of your pain as well as a possible pelvic inflammatory infection given your fever.  We have given you antibiotics here as well as sent you home with antibiotics.  Your swabs did show bacterial vaginosis.  This is not an STD.  This is just an overgrowth of the normal bacteria that lives in the vagina.  The medication Flagyl should help with this.  Do not drink alcohol while taking this medication or 48 hours after you stop taking this medication  I have prescribed you a short course of pain medicine.  Do not drive or operate heavy machinery while taking these medications  Follow-up with your primary care provider or OB/GYN for reevaluation  Return for new or worsening symptoms.

## 2020-12-29 NOTE — ED Notes (Signed)
Ultrasound at bedside

## 2020-12-29 NOTE — ED Provider Notes (Signed)
[facility]-EMERGENCY DEPT Provider Note   CSN: [id number] Arrival date & time: 12/28/20  2053     History Chief Complaint  Patient presents with   Abdominal Pain   Dysuria    Angelica Spencer is a 30 y.o. female with no significant past medical history who presents for evaluation of abdominal pain.  Began earlier today.  Noted to have a fever while here in the emergency department as well as some tachycardia.  Pain located to the left lower quadrant.  Some mild dysuria.  Also had some cough, congestion.  No recent sick contacts.  No known COVID exposures however significant other does work in the healthcare system.  Has not taken anything for her symptoms.  No sore throat, neck pain, neck stiffness, headache, chest pain, back pain, paresthesias or weakness.  Just finishing up her menstrual cycle.  Denies additional aggravating or relieving factors.  History obtained from patient, family room and past medical records.  No interpreter used.  HPI     Past Medical History:  Diagnosis Date   Migraines     Patient Active Problem List   Diagnosis Date Noted   Encounter for induction of labor 09/03/2020   Postpartum care following vaginal delivery 6/3 09/03/2020   SVD (spontaneous vaginal delivery) 09/03/2020   RhD negative 09/03/2020   Gestational hypertension 09/02/2020    History reviewed. No pertinent surgical history.   OB History     Gravida  2   Para  2   Term  2   Preterm      AB      Living  2      SAB      IAB      Ectopic      Multiple  0   Live Births  2           No family history on file.  Social History   Tobacco Use   Smoking status: Never   Smokeless tobacco: Never  Vaping Use   Vaping Use: Never used  Substance Use Topics   Alcohol use: Never   Drug use: Never    Home Medications Prior to Admission medications   Medication Sig Start Date End Date Taking? Authorizing Provider  acetaminophen (TYLENOL)  500 MG tablet Take 2 tablets (1,000 mg total) by mouth every 6 (six) hours as needed. Patient taking differently: Take 1,000 mg by mouth every 6 (six) hours as needed for mild pain. 09/04/20  Yes Law, Cassandra A, DO  doxycycline (VIBRAMYCIN) 100 MG capsule Take 1 capsule (100 mg total) by mouth 2 (two) times daily for 10 days. 12/29/20 01/08/21 Yes Derwin Reddy A, PA-C  famotidine (PEPCID) 20 MG tablet Take 20 mg by mouth 2 (two) times daily as needed for heartburn.   Yes [provider]  HYDROcodone-acetaminophen (NORCO/VICODIN) 5-325 MG tablet Take 2 tablets by mouth every 4 (four) hours as needed. 12/29/20  Yes Kashira Behunin A, PA-C  ibuprofen (ADVIL) 600 MG tablet Take 1 tablet (600 mg total) by mouth every 6 (six) hours as needed. Patient taking differently: Take 600 mg by mouth every 6 (six) hours as needed for mild pain. 09/04/20  Yes Law, Cassandra A, DO  metroNIDAZOLE (FLAGYL) 500 MG tablet Take 1 tablet (500 mg total) by mouth 2 (two) times daily for 10 days. 12/29/20 01/08/21 Yes Corona Popovich A, PA-C  BLISOVI FE 1/20 1-20 MG-MCG tablet Take 1 tablet by mouth daily. 12/05/20   [provider]  citalopram (CELEXA) 40 MG tablet Take 40 mg by mouth daily.    [provider]  NIFEdipine (ADALAT CC) 30 MG 24 hr tablet Take 1 tablet (30 mg total) by mouth daily. Patient not taking: Reported on 12/29/2020 09/05/20   Law, Cassandra A, DO    Allergies    Imitrex [sumatriptan]  Review of Systems   Review of Systems  Constitutional:  Positive for fatigue.  HENT:  Positive for congestion and rhinorrhea. Negative for sinus pressure, sinus pain, sore throat, trouble swallowing and voice change.   Respiratory:  Positive for shortness of breath. Negative for apnea, cough, choking, chest tightness, wheezing and stridor.   Cardiovascular: Negative.   Gastrointestinal:  Positive for abdominal pain and nausea. Negative for abdominal distention, anal bleeding, blood in stool,  constipation, diarrhea, rectal pain and vomiting.  Genitourinary:  Positive for dysuria and flank pain. Negative for decreased urine volume, difficulty urinating, frequency, menstrual problem, pelvic pain, urgency, vaginal bleeding, vaginal discharge and vaginal pain.  Skin: Negative.   Neurological: Negative.   All other systems reviewed and are negative.  Physical Exam Updated Vital Signs BP 130/88   Pulse 79   Temp 99.1 F (37.3 C) (Oral)   Resp 16   Ht  (1.6 m)   Wt 90.7 kg   SpO2 96%   BMI 35.43 kg/m   Physical Exam Vitals and nursing note reviewed.  Constitutional:      General: She is not in acute distress.    Appearance: She is well-developed. She is not ill-appearing, toxic-appearing or diaphoretic.  HENT:     Head: Normocephalic and atraumatic.     Nose:     Comments: Congestion bilaterally    Mouth/Throat:     Mouth: Mucous membranes are moist.  Eyes:     Pupils: Pupils are equal, round, and reactive to light.  Neck:     Comments: Full range of motion without difficulty Cardiovascular:     Rate and Rhythm: Tachycardia present.     Pulses: Normal pulses.     Heart sounds: Normal heart sounds.     Comments: Tachycardic Pulmonary:     Effort: Pulmonary effort is normal. No respiratory distress.     Breath sounds: Normal breath sounds and air entry.     Comments: Clear bilaterally, speaks in full sentences without difficulty Chest:     Comments: Equal rise and fall to chest wall, nontender Abdominal:     General: Bowel sounds are normal. There is no distension.     Palpations: Abdomen is soft.     Tenderness: There is abdominal tenderness in the left lower quadrant. Negative signs include Murphy's sign and McBurney's sign.     Hernia: No hernia is present.     Comments: Soft, tenderness to left lower quadrant, left flank, posterior left chest wall  Genitourinary:    Comments: Normal appearing external female genitalia without rashes or lesions, normal  vaginal epithelium. Normal appearing cervix with old blood, white discharge. No cervical petechiae. Cervical os is closed. There is old blood noted at the os.No Odor. Bimanual: MILD CMT, No palpable adnexal masses< Left adnexal tenderness. Uterus midline and not fixed. Rectovaginal exam was deferred.  No cystocele or rectocele noted. No pelvic lymphadenopathy noted. Wet prep was obtained.  Cultures for gonorrhea and chlamydia collected. Exam performed with chaperone in room.   Musculoskeletal:        General: Normal range of motion.     Cervical back: Full passive range of  motion without pain and normal range of motion.     Comments: No bony tenderness.  Moves all 4 extremities without difficulty.  Skin:    General: Skin is warm and dry.     Capillary Refill: Capillary refill takes less than 2 seconds.     Comments: No rashes or lesions  Neurological:     General: No focal deficit present.     Mental Status: She is alert.  Psychiatric:        Mood and Affect: Mood normal.    ED Results / Procedures / Treatments   Labs (all labs ordered are listed, but only abnormal results are displayed) Labs Reviewed  WET PREP, GENITAL - Abnormal; Notable for the following components:      Result Value   Clue Cells Wet Prep HPF POC PRESENT (*)    WBC, Wet Prep HPF POC PRESENT (*)    All other components within normal limits  COMPREHENSIVE METABOLIC PANEL - Abnormal; Notable for the following components:   Glucose, Bld 113 (*)    All other components within normal limits  URINALYSIS, ROUTINE W REFLEX MICROSCOPIC - Abnormal; Notable for the following components:   Hgb urine dipstick LARGE (*)    All other components within normal limits  CBC WITH DIFFERENTIAL/PLATELET - Abnormal; Notable for the following components:   Lymphs Abs 0.6 (*)    All other components within normal limits  RESP PANEL BY RT-PCR (FLU A&B, COVID) ARPGX2  CULTURE, BLOOD (SINGLE)  LIPASE, BLOOD  LACTIC ACID, PLASMA  I-STAT  BETA HCG BLOOD, ED ([facility], [facility], [facility] ONLY)  GC/CHLAMYDIA PROBE AMP () NOT AT [facility]    EKG EKG Interpretation  Date/Time:  Tuesday December 28 2020 23:11:20 EDT Ventricular Rate:  123 PR Interval:  136 QRS Duration: 48 QT Interval:  327 QTC Calculation: 468 R Axis:   56 Text Interpretation: Sinus tachycardia Borderline T abnormalities, diffuse leads No previous ECGs available Confirmed by Paula Libra ([id number]) on 12/28/2020 11:53:17 PM  Radiology CT Angio Chest PE W/Cm &/Or Wo Cm  Result Date: 12/29/2020 CLINICAL DATA:  Fever, cough, chest pain, left flank pain, evaluate for PE EXAM: CT ANGIOGRAPHY CHEST CT ABDOMEN AND PELVIS WITH CONTRAST TECHNIQUE: Multidetector CT imaging of the chest was performed using the standard protocol during bolus administration of intravenous contrast. Multiplanar CT image reconstructions and MIPs were obtained to evaluate the vascular anatomy. Multidetector CT imaging of the abdomen and pelvis was performed using the standard protocol during bolus administration of intravenous contrast. CONTRAST:  OMNIPAQUE IOHEXOL 350 MG/ML SOLN COMPARISON:  None. FINDINGS: CTA CHEST FINDINGS Cardiovascular: Suboptimal opacification of the bilateral pulmonary arteries due to bolus timing and respiratory motion. No evidence of central pulmonary embolism. Although not tailored for evaluation of the thoracic aorta, there is no evidence of thoracic aortic aneurysm or dissection. The heart is normal in size. Mediastinum/Nodes: No suspicious mediastinal lymphadenopathy. Visualized thyroid is unremarkable. Lungs/Pleura: Evaluation of the lung parenchyma is constrained by respiratory motion. Within that constraint, there are no suspicious pulmonary nodules. No focal consolidation. No pleural effusion or pneumothorax. Musculoskeletal: Mild degenerative changes of the midthoracic spine. Review of the MIP images confirms the above findings. CT ABDOMEN and PELVIS FINDINGS Hepatobiliary:  Liver is within normal limits. Gallbladder is unremarkable. No intrahepatic or extrahepatic ductal dilatation. Pancreas: Within normal limits. Spleen: Within normal limits. Adrenals/Urinary Tract: Adrenal glands are within normal limits. Kidneys are within normal limits.  No hydronephrosis. Thick-walled bladder, although underdistended. Stomach/Bowel: Stomach is within  normal limits. No evidence of bowel obstruction. Normal appendix (series 4/image 66). No colonic wall thickening or inflammatory changes. Vascular/Lymphatic: No evidence of abdominal aortic aneurysm. No suspicious abdominopelvic lymphadenopathy. Reproductive: Uterus and bilateral ovaries are within normal limits, noting a 2.7 cm left corpus luteum, physiologic. Other: No abdominopelvic ascites. Musculoskeletal: Visualized osseous structures are within normal limits. Review of the MIP images confirms the above findings. IMPRESSION: No evidence of central pulmonary embolism. No evidence of acute cardiopulmonary disease. Negative CT abdomen/pelvis. Electronically Signed   By: Charline Bills M.D.   On: 12/29/2020 00:24   US Transvaginal Non-OB  Result Date: 12/29/2020 CLINICAL DATA:  Left lower quadrant abdominal pain x1 day EXAM: TRANSABDOMINAL AND TRANSVAGINAL ULTRASOUND OF PELVIS DOPPLER ULTRASOUND OF OVARIES TECHNIQUE: Both transabdominal and transvaginal ultrasound examinations of the pelvis were performed. Transabdominal technique was performed for global imaging of the pelvis including uterus, ovaries, adnexal regions, and pelvic cul-de-sac. It was necessary to proceed with endovaginal exam following the transabdominal exam to visualize the right ovary. Color and duplex Doppler ultrasound was utilized to evaluate blood flow to the ovaries. COMPARISON:  CT abdomen/pelvis dated 12/28/2020 FINDINGS: Uterus Measurements: 9.5 x 3.3 x 6.0 cm = volume: 97 mL. No fibroids or other mass visualized. Endometrium Thickness: 7 mm.  No focal abnormality  visualized. Right ovary Measurements: 2.5 x 2.3 x 2.5 cm = volume: 7.5 mL. Normal appearance/no adnexal mass. Left ovary Measurements: 3.5 x 2.8 x 3.3 cm = volume: 17.0 mL. 2.4 x 2.0 x 2.3 cm simple corpus luteum, benign/physiologic. Pulsed Doppler evaluation of both ovaries demonstrates normal low-resistance arterial and venous waveforms. Other findings No abnormal free fluid. IMPRESSION: 2.4 cm simple left corpus luteum, benign/physiologic. No follow-up recommended. Negative pelvic ultrasound. No evidence of ovarian torsion. Electronically Signed   By: Charline Bills M.D.   On: 12/29/2020 02:03   US Pelvis Complete  Result Date: 12/29/2020 CLINICAL DATA:  Left lower quadrant abdominal pain x1 day EXAM: TRANSABDOMINAL AND TRANSVAGINAL ULTRASOUND OF PELVIS DOPPLER ULTRASOUND OF OVARIES TECHNIQUE: Both transabdominal and transvaginal ultrasound examinations of the pelvis were performed. Transabdominal technique was performed for global imaging of the pelvis including uterus, ovaries, adnexal regions, and pelvic cul-de-sac. It was necessary to proceed with endovaginal exam following the transabdominal exam to visualize the right ovary. Color and duplex Doppler ultrasound was utilized to evaluate blood flow to the ovaries. COMPARISON:  CT abdomen/pelvis dated 12/28/2020 FINDINGS: Uterus Measurements: 9.5 x 3.3 x 6.0 cm = volume: 97 mL. No fibroids or other mass visualized. Endometrium Thickness: 7 mm.  No focal abnormality visualized. Right ovary Measurements: 2.5 x 2.3 x 2.5 cm = volume: 7.5 mL. Normal appearance/no adnexal mass. Left ovary Measurements: 3.5 x 2.8 x 3.3 cm = volume: 17.0 mL. 2.4 x 2.0 x 2.3 cm simple corpus luteum, benign/physiologic. Pulsed Doppler evaluation of both ovaries demonstrates normal low-resistance arterial and venous waveforms. Other findings No abnormal free fluid. IMPRESSION: 2.4 cm simple left corpus luteum, benign/physiologic. No follow-up recommended. Negative pelvic  ultrasound. No evidence of ovarian torsion. Electronically Signed   By: Charline Bills M.D.   On: 12/29/2020 02:03   CT Abdomen Pelvis W Contrast  Result Date: 12/29/2020 CLINICAL DATA:  Fever, cough, chest pain, left flank pain, evaluate for PE EXAM: CT ANGIOGRAPHY CHEST CT ABDOMEN AND PELVIS WITH CONTRAST TECHNIQUE: Multidetector CT imaging of the chest was performed using the standard protocol during bolus administration of intravenous contrast. Multiplanar CT image reconstructions and MIPs were obtained to evaluate the vascular anatomy.  Multidetector CT imaging of the abdomen and pelvis was performed using the standard protocol during bolus administration of intravenous contrast. CONTRAST:  OMNIPAQUE IOHEXOL 350 MG/ML SOLN COMPARISON:  None. FINDINGS: CTA CHEST FINDINGS Cardiovascular: Suboptimal opacification of the bilateral pulmonary arteries due to bolus timing and respiratory motion. No evidence of central pulmonary embolism. Although not tailored for evaluation of the thoracic aorta, there is no evidence of thoracic aortic aneurysm or dissection. The heart is normal in size. Mediastinum/Nodes: No suspicious mediastinal lymphadenopathy. Visualized thyroid is unremarkable. Lungs/Pleura: Evaluation of the lung parenchyma is constrained by respiratory motion. Within that constraint, there are no suspicious pulmonary nodules. No focal consolidation. No pleural effusion or pneumothorax. Musculoskeletal: Mild degenerative changes of the midthoracic spine. Review of the MIP images confirms the above findings. CT ABDOMEN and PELVIS FINDINGS Hepatobiliary: Liver is within normal limits. Gallbladder is unremarkable. No intrahepatic or extrahepatic ductal dilatation. Pancreas: Within normal limits. Spleen: Within normal limits. Adrenals/Urinary Tract: Adrenal glands are within normal limits. Kidneys are within normal limits.  No hydronephrosis. Thick-walled bladder, although underdistended. Stomach/Bowel:  Stomach is within normal limits. No evidence of bowel obstruction. Normal appendix (series 4/image 66). No colonic wall thickening or inflammatory changes. Vascular/Lymphatic: No evidence of abdominal aortic aneurysm. No suspicious abdominopelvic lymphadenopathy. Reproductive: Uterus and bilateral ovaries are within normal limits, noting a 2.7 cm left corpus luteum, physiologic. Other: No abdominopelvic ascites. Musculoskeletal: Visualized osseous structures are within normal limits. Review of the MIP images confirms the above findings. IMPRESSION: No evidence of central pulmonary embolism. No evidence of acute cardiopulmonary disease. Negative CT abdomen/pelvis. Electronically Signed   By: Charline Bills M.D.   On: 12/29/2020 00:24   [place] Art/Ven Flow Abd Pelv Doppler  Result Date: 12/29/2020 CLINICAL DATA:  Left lower quadrant abdominal pain x1 day EXAM: TRANSABDOMINAL AND TRANSVAGINAL ULTRASOUND OF PELVIS DOPPLER ULTRASOUND OF OVARIES TECHNIQUE: Both transabdominal and transvaginal ultrasound examinations of the pelvis were performed. Transabdominal technique was performed for global imaging of the pelvis including uterus, ovaries, adnexal regions, and pelvic cul-de-sac. It was necessary to proceed with endovaginal exam following the transabdominal exam to visualize the right ovary. Color and duplex Doppler ultrasound was utilized to evaluate blood flow to the ovaries. COMPARISON:  CT abdomen/pelvis dated 12/28/2020 FINDINGS: Uterus Measurements: 9.5 x 3.3 x 6.0 cm = volume: 97 mL. No fibroids or other mass visualized. Endometrium Thickness: 7 mm.  No focal abnormality visualized. Right ovary Measurements: 2.5 x 2.3 x 2.5 cm = volume: 7.5 mL. Normal appearance/no adnexal mass. Left ovary Measurements: 3.5 x 2.8 x 3.3 cm = volume: 17.0 mL. 2.4 x 2.0 x 2.3 cm simple corpus luteum, benign/physiologic. Pulsed Doppler evaluation of both ovaries demonstrates normal low-resistance arterial and venous waveforms.  Other findings No abnormal free fluid. IMPRESSION: 2.4 cm simple left corpus luteum, benign/physiologic. No follow-up recommended. Negative pelvic ultrasound. No evidence of ovarian torsion. Electronically Signed   By: Charline Bills M.D.   On: 12/29/2020 02:03    Procedures Procedures   Medications Ordered in ED Medications  acetaminophen (TYLENOL) tablet 650 mg (650 mg Oral Given 12/28/20 2251)  sodium chloride 0.9 % bolus 1,000 mL (0 mLs Intravenous Stopped 12/29/20 0218)  iohexol (OMNIPAQUE) 350 MG/ML injection 100 mL (100 mLs Intravenous Contrast Given 12/28/20 2341)  cefTRIAXone (ROCEPHIN) injection 1 g (1 g Intramuscular Given 12/29/20 0251)  doxycycline (VIBRA-TABS) tablet 100 mg (100 mg Oral Given 12/29/20 0251)  metroNIDAZOLE (FLAGYL) tablet 500 mg (500 mg Oral Given 12/29/20 0251)    ED  Course  I have reviewed the triage vital signs and the nursing notes.  Pertinent labs & imaging results that were available during my care of the patient were reviewed by me and considered in my medical decision making (see chart for details).  Here for evaluation of abdominal pain.  On arrival she is febrile, tachycardic however appears otherwise well, nonseptic appearing.  Does have associated congestion, rhinorrhea, cough.  Some urinary complaints.  Defervesced with antipyretic given in ED.  Heart rate improved after IV fluids.  Has a nonfocal neuro exam without deficits.  Heart and lungs clear.  Abdomen tender to left lower quadrant as well as left flank, left posterior chest wall.  No overlying skin changes.  Her compartments are soft.  No clinical evidence of VTE on exam.  Cannot PERC due to tachycardia.  Labs and imaging personally reviewed and interpreted: CBC without leukocytosis Urine with blood, on menstrual cycle Metabolic panel glucose at 113, no additional electrolyte, renal or liver normality Pregnancy test negative and lactic acid 1.09 lipase 28 Blood culture pending Wet prep with  BV, WBC COVID, flu negative CTA chest without acute abnormality CT abdomen and pelvis without acute abnormality Ultrasound with left cyst, no torsion, TOA  Patient reassessed.  GU exam with some mild CMT, left adnexal tenderness.  Given her fever, symptoms will treat for PID.  Given Rocephin, Doxy and Flagyl here in ED.  BV on wet prep.  Discussed alcohol cessation while on these medications.  She will need to follow-up with OB/GYN, PCP for follow-up.  She is agreeable.  Patient is nontoxic, nonseptic appearing, in no apparent distress.  Patient's pain and other symptoms adequately managed in emergency department.  Fluid bolus given.  Labs, imaging and vitals reviewed. On repeat exam patient does not have a surgical abdomin and there are no peritoneal signs.  No indication of appendicitis, bowel obstruction, bowel perforation, cholecystitis, diverticulitis, TOA, torsion, infected kidney stone or ectopic pregnancy.     The patient has been appropriately medically screened and/or stabilized in the ED. I have low suspicion for any other emergent medical condition which would require further screening, evaluation or treatment in the ED or require inpatient management.  Patient is hemodynamically stable and in no acute distress.  Patient able to ambulate in department prior to ED.  Evaluation does not show acute pathology that would require ongoing or additional emergent interventions while in the emergency department or further inpatient treatment.  I have discussed the diagnosis with the patient and answered all questions.  Pain is been managed while in the emergency department and patient has no further complaints prior to discharge.  Patient is comfortable with plan discussed in room and is stable for discharge at this time.  I have discussed strict return precautions for returning to the emergency department.  Patient was encouraged to follow-up with PCP/specialist refer to at discharge.     MDM  Rules/Calculators/A&P                           Angelica Spencer was evaluated in Emergency Department on 12/29/2020 for the symptoms described in the history of present illness. She was evaluated in the context of the global COVID-19 pandemic, which necessitated consideration that the patient might be at risk for infection with the SARS-CoV-2 virus that causes COVID-19. Institutional protocols and algorithms that pertain to the evaluation of patients at risk for COVID-19 are in a state of rapid change based on  information released by regulatory bodies including the CDC and federal and state organizations. These policies and algorithms were followed during the patient's care in the ED.  Final Clinical Impression(s) / ED Diagnoses Final diagnoses:  Fever, unspecified fever cause  Left lower quadrant abdominal pain  Bacterial vaginosis    Rx / DC Orders ED Discharge Orders          Ordered    doxycycline (VIBRAMYCIN) 100 MG capsule  2 times daily        12/29/20 0250    metroNIDAZOLE (FLAGYL) 500 MG tablet  2 times daily        12/29/20 0250    HYDROcodone-acetaminophen (NORCO/VICODIN) 5-325 MG tablet  Every 4 hours PRN        12/29/20 0250             Nataley Bahri A, PA-C 12/29/20 0301    Molpus, Jonny Ruiz, MD 12/29/20 [phone]

## 2020-12-30 LAB — GC/CHLAMYDIA PROBE AMP (~~LOC~~) NOT AT ARMC
Chlamydia: NEGATIVE
Comment: NEGATIVE
Comment: NORMAL
Neisseria Gonorrhea: NEGATIVE

## 2021-01-03 LAB — CULTURE, BLOOD (SINGLE): Culture: NO GROWTH

## 2021-05-19 ENCOUNTER — Other Ambulatory Visit: Payer: Self-pay

## 2021-05-19 ENCOUNTER — Encounter: Payer: Self-pay | Admitting: Emergency Medicine

## 2021-05-19 ENCOUNTER — Ambulatory Visit
Admission: EM | Admit: 2021-05-19 | Discharge: 2021-05-19 | Disposition: A | Discharge status: Home / Self Care | Payer: 59 | Attending: Internal Medicine | Admitting: Internal Medicine

## 2021-05-19 DIAGNOSIS — J029 Acute pharyngitis, unspecified: Secondary | ICD-10-CM

## 2021-05-19 DIAGNOSIS — J039 Acute tonsillitis, unspecified: Secondary | ICD-10-CM | POA: Insufficient documentation

## 2021-05-19 LAB — POCT RAPID STREP A (OFFICE): Rapid Strep A Screen: NEGATIVE

## 2021-05-19 MED ORDER — AMOXICILLIN 500 MG PO CAPS: 500.0000 mg | ORAL_CAPSULE | Freq: Two times a day (BID) | ORAL | 0 refills | Status: AC

## 2021-05-19 NOTE — ED Provider Notes (Signed)
[facility]    CSN: [id number] Arrival date & time: 05/19/21  0843      History   Chief Complaint Chief Complaint  Patient presents with   Sore Throat    HPI Angelica Spencer is a 31 y.o. female.   Patient presents with sore throat and nasal congestion that has been present for approximately 4 days.  She reports that she had a virtual visit when symptoms first started on Monday and was prescribed Tamiflu due to suspicion of the flu.  She reports no improvement of symptoms with this.  She describes her sore throat as "feeling like I am swallowing glass".  Patient is able to swallow sputum.  Denies any known fevers or sick contacts at home.  Denies chest pain, shortness of breath, ear pain, nausea, vomiting, diarrhea, abdominal pain.  Patient has also had 2 at home COVID-19 tests that were negative.   Sore Throat   Past Medical History:  Diagnosis Date   Migraines     Patient Active Problem List   Diagnosis Date Noted   Encounter for induction of labor 09/03/2020   Postpartum care following vaginal delivery 6/3 09/03/2020   SVD (spontaneous vaginal delivery) 09/03/2020   RhD negative 09/03/2020   Gestational hypertension 09/02/2020    History reviewed. No pertinent surgical history.  OB History     Gravida  2   Para  2   Term  2   Preterm      AB      Living  2      SAB      IAB      Ectopic      Multiple  0   Live Births  2            Home Medications    Prior to Admission medications   Medication Sig Start Date End Date Taking? Authorizing Provider  amoxicillin (AMOXIL) 500 MG capsule Take 1 capsule (500 mg total) by mouth 2 (two) times daily for 10 days. 05/19/21 05/29/21 Yes Zyion Leidner, Acie Fredrickson, FNP  acetaminophen (TYLENOL) 500 MG tablet Take 2 tablets (1,000 mg total) by mouth every 6 (six) hours as needed. Patient taking differently: Take 1,000 mg by mouth every 6 (six) hours as needed for mild pain. 09/04/20   Law, Cassandra A, DO   BLISOVI FE 1/20 1-20 MG-MCG tablet Take 1 tablet by mouth daily. 12/05/20   [provider]  citalopram (CELEXA) 40 MG tablet Take 40 mg by mouth daily.    [provider]  famotidine (PEPCID) 20 MG tablet Take 20 mg by mouth 2 (two) times daily as needed for heartburn.    [provider]  HYDROcodone-acetaminophen (NORCO/VICODIN) 5-325 MG tablet Take 2 tablets by mouth every 4 (four) hours as needed. 12/29/20   Henderly, Britni A, PA-C  ibuprofen (ADVIL) 600 MG tablet Take 1 tablet (600 mg total) by mouth every 6 (six) hours as needed. Patient taking differently: Take 600 mg by mouth every 6 (six) hours as needed for mild pain. 09/04/20   Law, Cassandra A, DO  NIFEdipine (ADALAT CC) 30 MG 24 hr tablet Take 1 tablet (30 mg total) by mouth daily. Patient not taking: Reported on 12/29/2020 09/05/20   Toy Baker, DO    Family History History reviewed. No pertinent family history.  Social History Social History   Tobacco Use   Smoking status: Never   Smokeless tobacco: Never  Vaping Use   Vaping Use: Never used  Substance Use Topics   Alcohol use: Never   Drug use: Never     Allergies   Imitrex [sumatriptan]   Review of Systems Review of Systems Per HPI  Physical Exam Triage Vital Signs ED Triage Vitals  Enc Vitals Group     BP 05/19/21 0940 (!) 137/93     Pulse Rate 05/19/21 0940 97     Resp 05/19/21 0940 16     Temp 05/19/21 0940 98 F (36.7 C)     Temp Source 05/19/21 0940 Oral     SpO2 05/19/21 0940 96 %     Weight --      Height --      Head Circumference --      Peak Flow --      Pain Score 05/19/21 0943 6     Pain Loc --      Pain Edu? --      Excl. in GC? --    No data found.  Updated Vital Signs BP (!) 137/93 (BP Location: Left Arm)    Pulse 97    Temp 98 F (36.7 C) (Oral)    Resp 16    SpO2 96%   Visual Acuity Right Eye Distance:   Left Eye Distance:   Bilateral Distance:    Right Eye Near:   Left Eye Near:     Bilateral Near:     Physical Exam Constitutional:      General: She is not in acute distress.    Appearance: Normal appearance. She is not toxic-appearing or diaphoretic.  HENT:     Head: Normocephalic and atraumatic.     Right Ear: Tympanic membrane and ear canal normal.     Left Ear: Tympanic membrane and ear canal normal.     Nose: Congestion present.     Mouth/Throat:     Mouth: Mucous membranes are moist.     Pharynx: Posterior oropharyngeal erythema present. No pharyngeal swelling or oropharyngeal exudate.     Tonsils: No tonsillar exudate or tonsillar abscesses. 1+ on the right. 1+ on the left.  Eyes:     Extraocular Movements: Extraocular movements intact.     Conjunctiva/sclera: Conjunctivae normal.     Pupils: Pupils are equal, round, and reactive to light.  Cardiovascular:     Rate and Rhythm: Normal rate and regular rhythm.     Pulses: Normal pulses.     Heart sounds: Normal heart sounds.  Pulmonary:     Effort: Pulmonary effort is normal. No respiratory distress.     Breath sounds: Normal breath sounds. No wheezing.  Abdominal:     General: Abdomen is flat. Bowel sounds are normal.     Palpations: Abdomen is soft.  Musculoskeletal:        General: Normal range of motion.     Cervical back: Normal range of motion.  Skin:    General: Skin is warm and dry.  Neurological:     General: No focal deficit present.     Mental Status: She is alert and oriented to person, place, and time. Mental status is at baseline.  Psychiatric:        Mood and Affect: Mood normal.        Behavior: Behavior normal.     UC Treatments / Results  Labs (all labs ordered are listed, but only abnormal results are displayed) Labs Reviewed  CULTURE, GROUP A STREP [facility])  POCT RAPID STREP A (OFFICE)    EKG   Radiology No results  found.  Procedures Procedures (including critical care time)  Medications Ordered in UC Medications - No data to display  Initial Impression /  Assessment and Plan / UC Course  I have reviewed the triage vital signs and the nursing notes.  Pertinent labs & imaging results that were available during my care of the patient were reviewed by me and considered in my medical decision making (see chart for details).     Rapid strep test was negative but still suspicious of strep throat given appearance of acute tonsillitis and posterior pharynx on exam.  Will opt to treat with amoxicillin antibiotic.  No signs of peritonsillar abscess on exam.  Throat cultures pending.  Do not think that additional viral testing is needed given that patient had negative COVID test at home.  Discussed return precautions.  Patient verbalized understanding and was agreeable with plan. Final Clinical Impressions(s) / UC Diagnoses   Final diagnoses:  Acute tonsillitis, unspecified etiology  Sore throat     Discharge Instructions      Your rapid strep test was negative but I am still suspicious of strep throat given appearance of your throat on exam so you are being treated with antibiotic.  Please follow-up if symptoms persist or worsen.    ED Prescriptions     Medication Sig Dispense Auth. Provider   amoxicillin (AMOXIL) 500 MG capsule Take 1 capsule (500 mg total) by mouth 2 (two) times daily for 10 days. 20 capsule Gustavus Bryant, [place]      PDMP not reviewed this encounter.   Gustavus Bryant, [place] 05/19/21 1025

## 2021-05-19 NOTE — Discharge Instructions (Signed)
Your rapid strep test was negative but I am still suspicious of strep throat given appearance of your throat on exam so you are being treated with antibiotic.  Please follow-up if symptoms persist or worsen.

## 2021-05-19 NOTE — ED Triage Notes (Signed)
Had virtual visit for symptoms Monday, has been on tamiflu since without improvement of symptoms. C/o sore throat, "feels like I'm swallowing glass" has some mildly muffled voice sounds, reports 2 at home negative covid tests.

## 2021-05-20 ENCOUNTER — Encounter: Payer: Self-pay | Admitting: Plastic Surgery

## 2021-05-20 ENCOUNTER — Other Ambulatory Visit: Payer: Self-pay

## 2021-05-20 ENCOUNTER — Ambulatory Visit (INDEPENDENT_AMBULATORY_CARE_PROVIDER_SITE_OTHER): Payer: 59 | Admitting: Plastic Surgery

## 2021-05-20 DIAGNOSIS — M542 Cervicalgia: Secondary | ICD-10-CM | POA: Diagnosis not present

## 2021-05-20 DIAGNOSIS — M546 Pain in thoracic spine: Secondary | ICD-10-CM

## 2021-05-20 DIAGNOSIS — N62 Hypertrophy of breast: Secondary | ICD-10-CM

## 2021-05-20 DIAGNOSIS — M549 Dorsalgia, unspecified: Secondary | ICD-10-CM | POA: Insufficient documentation

## 2021-05-20 DIAGNOSIS — G8929 Other chronic pain: Secondary | ICD-10-CM

## 2021-05-20 HISTORY — DX: Cervicalgia: M54.2

## 2021-05-20 HISTORY — DX: Hypertrophy of breast: N62

## 2021-05-20 HISTORY — DX: Dorsalgia, unspecified: M54.9

## 2021-05-20 NOTE — Progress Notes (Signed)
Patient ID: Angelica Spencer, female    DOB: 09/10/1990, 31 y.o.   MRN: [id number]   Chief Complaint  Patient presents with   Consult        Breast Problem    Mammary Hyperplasia: The patient is a 31 y.o. female with a history of mammary hyperplasia for several years.  She is here with her husband.  She has extremely large breasts causing symptoms that include the following: Back pain in the upper and lower back, including neck pain. She pulls or pins her bra straps to provide better lift and relief of the pressure and pain. She notices relief by holding her breast up manually.  Her shoulder straps cause grooves and pain and pressure that requires padding for relief. Pain medication is sometimes required with motrin and tylenol.  Activities that are hindered by enlarged breasts include: exercise and running.  She has tried supportive clothing as well as fitted bras without improvement.  Her breasts are extremely large and fairly symmetric.  She has hyperpigmentation of the inframammary area on both sides.  The sternal to nipple distance on the right is 32 cm and the left is 30 cm.  The IMF distance is 15 cm.  She is 5 feet 3 inches tall and weighs 180 pounds.  The BMI = 31.9 kg/m.  Preoperative bra size = G cup. She would like to be a C/D but ok with a B as well.  The estimated excess breast tissue to be removed at the time of surgery = 450-485 grams on the left and 450-485 grams on the right.  Mammogram history: non.  Family history of breast cancer:  none.  Tobacco use:  none.   The patient expresses the desire to pursue surgical intervention. She has two kids at home.    Review of Systems  Constitutional: Negative.   HENT: Negative.    Eyes: Negative.   Respiratory: Negative.  Negative for chest tightness and shortness of breath.   Cardiovascular: Negative.  Negative for leg swelling.  Gastrointestinal: Negative.   Endocrine: Negative.   Genitourinary: Negative.   Musculoskeletal:   Positive for back pain and neck pain.  Skin: Negative.   Hematological: Negative.   Psychiatric/Behavioral: Negative.     Past Medical History:  Diagnosis Date   Migraines     History reviewed. No pertinent surgical history.    Current Outpatient Medications:    acetaminophen (TYLENOL) 500 MG tablet, Take 2 tablets (1,000 mg total) by mouth every 6 (six) hours as needed. (Patient taking differently: Take 1,000 mg by mouth every 6 (six) hours as needed for mild pain.), Disp: 30 tablet, Rfl: 0   amoxicillin (AMOXIL) 500 MG capsule, Take 1 capsule (500 mg total) by mouth 2 (two) times daily for 10 days., Disp: 20 capsule, Rfl: 0   BLISOVI FE 1/20 1-20 MG-MCG tablet, Take 1 tablet by mouth daily., Disp: , Rfl:    citalopram (CELEXA) 40 MG tablet, Take 40 mg by mouth daily., Disp: , Rfl:    famotidine (PEPCID) 20 MG tablet, Take 20 mg by mouth 2 (two) times daily as needed for heartburn., Disp: , Rfl:    ibuprofen (ADVIL) 600 MG tablet, Take 1 tablet (600 mg total) by mouth every 6 (six) hours as needed. (Patient taking differently: Take 600 mg by mouth every 6 (six) hours as needed for mild pain.), Disp: 30 tablet, Rfl: 0   Objective:   Vitals:   05/20/21 1049  BP: (!) 136/96  Pulse: 90  SpO2: 97%    Physical Exam Vitals and nursing note reviewed.  Constitutional:      Appearance: Normal appearance.  HENT:     Head: Normocephalic and atraumatic.  Cardiovascular:     Rate and Rhythm: Normal rate.     Pulses: Normal pulses.  Pulmonary:     Effort: Pulmonary effort is normal.  Abdominal:     General: There is no distension.     Palpations: Abdomen is soft.     Tenderness: There is no abdominal tenderness.  Musculoskeletal:        General: No swelling or deformity.  Skin:    General: Skin is warm.     Capillary Refill: Capillary refill takes less than 2 seconds.     Coloration: Skin is not jaundiced.     Findings: No bruising.  Neurological:     Mental Status: She is alert  and oriented to person, place, and time.  Psychiatric:        Mood and Affect: Mood normal.        Behavior: Behavior normal.        Thought Content: Thought content normal.        Judgment: Judgment normal.    Assessment & Plan:  Symptomatic mammary hypertrophy - Plan: Ambulatory referral to Physical Therapy  Chronic bilateral thoracic back pain - Plan: Ambulatory referral to Physical Therapy  Neck pain - Plan: Ambulatory referral to Physical Therapy  The procedure the patient selected and that was best for the patient was discussed. The risk were discussed and include but not limited to the following:  Breast asymmetry, fluid accumulation, firmness of the breast, inability to breast feed, loss of nipple or areola, skin loss, change in skin and nipple sensation, fat necrosis of the breast tissue, bleeding, infection and healing delay.  There are risks of anesthesia and injury to nerves or blood vessels.  Allergic reaction to tape, suture and skin glue are possible.  There will be swelling.  Any of these can lead to the need for revisional surgery.  A breast reduction has potential to interfere with diagnostic procedures in the future.  This procedure is best done when the breast is fully developed.  Changes in the breast will continue to occur over time: pregnancy, weight gain or weigh loss.    Total time: 40 minutes. This includes time spent with the patient during the visit as well as time spent before and after the visit reviewing the chart, documenting the encounter and ordering pertinent studies. and literature emailed to the patient.   Physical therapy:  ordered Mammogram:  not indicated  Patient is a good candidate for bilateral breast reduction with possible liposuction.  We will have her do physical therapy and follow-up as soon as she is finished.  Pictures were obtained of the patient and placed in the chart with the patient's or guardian's permission.   Alena Bills Giovanie Lefebre,  DO

## 2021-05-22 LAB — CULTURE, GROUP A STREP (THRC)

## 2021-06-13 NOTE — Therapy (Incomplete)
OUTPATIENT PHYSICAL THERAPY CERVICAL EVALUATION   Patient Name: Angelica Spencer MRN: [id number] DOB:02-Apr-1991, 31 y.o., female Today's Date: 06/13/2021    Past Medical History:  Diagnosis Date   Migraines    No past surgical history on file. Patient Active Problem List   Diagnosis Date Noted   Symptomatic mammary hypertrophy 05/20/2021   Back pain 05/20/2021   Neck pain 05/20/2021   Encounter for induction of labor 09/03/2020   Postpartum care following vaginal delivery 6/3 09/03/2020   SVD (spontaneous vaginal delivery) 09/03/2020   RhD negative 09/03/2020   Gestational hypertension 09/02/2020    PCP: Gavin Potters, FNP  REFERRING PROVIDER: Wallace Going, DO  REFERRING DIAG: Symptomatic mammary hypertrophy, Chronic bilateral thoracic back pain, Neck pain  THERAPY DIAG:  No diagnosis found.  ONSET DATE: ***  SUBJECTIVE:       SUBJECTIVE STATEMENT: ***  PERTINENT HISTORY:  None  PAIN:  Are you having pain?  Yes: NPRS scale: ***/10 Pain location: Bilateral neck and upper back region Pain description: *** Aggravating factors: *** Relieving factors: ***  PRECAUTIONS: None  WEIGHT BEARING RESTRICTIONS No  FALLS:  Has patient fallen in last 6 months? No  LIVING ENVIRONMENT: Lives with: {[facility] lives with:25569::"lives with their family"} Lives in: {Lives in:25570} Stairs: {yes/no:20286}; {Stairs:24000} Has following equipment at home: {Assistive devices:23999}  OCCUPATION: ***  PLOF: Independent  PATIENT GOALS: Pain relief and improve posture   OBJECTIVE:  DIAGNOSTIC FINDINGS:  N/A  PATIENT SURVEYS:  NDI ***  COGNITION: Overall cognitive status: Within functional limits for tasks assessed  SENSATION: WFL  POSTURE:  Rounded shoulder, forward head posture  PALPATION: Tender to palpation bilateral upper trap, ***   CERVICAL ROM:   Active ROM A/PROM (deg) 06/13/2021  Flexion   Extension   Right lateral flexion   Left lateral  flexion   Right rotation   Left rotation    UE ROM:  Active ROM Right 06/13/2021 Left 06/13/2021  Shoulder flexion    Shoulder extension    Shoulder abduction    Shoulder extension    Shoulder internal rotation    Shoulder external rotation     UE MMT:  MMT Right 06/13/2021 Left 06/13/2021  Shoulder flexion    Shoulder extension    Shoulder abduction    Shoulder internal rotation    Shoulder external rotation    Periscapular musculature 4- 4-    CERVICAL SPECIAL TESTS:  Cervical radicular testing negative   TODAY'S TREATMENT:  ***  PATIENT EDUCATION:  Education details: Exam findings, POC, HEP Person educated: Patient Education method: Explanation, Demonstration, Tactile cues, Verbal cues, and Handouts Education comprehension: verbalized understanding, returned demonstration, verbal cues required, tactile cues required, and needs further education  HOME EXERCISE PROGRAM: ***   ASSESSMENT: CLINICAL IMPRESSION: Patient is a 31 y.o. female who was seen today for physical therapy evaluation and treatment for neck and upper back pain due to poor postural strength and endurance.    OBJECTIVE IMPAIRMENTS decreased strength, postural dysfunction, and pain.   ACTIVITY LIMITATIONS community activity, meal prep, occupation, and shopping.   PERSONAL FACTORS Past/current experiences and Time since onset of injury/illness/exacerbation are also affecting patient's functional outcome.    REHAB POTENTIAL: Good  CLINICAL DECISION MAKING: Stable/uncomplicated  EVALUATION COMPLEXITY: Low   GOALS: Goals reviewed with patient? Yes  SHORT TERM GOALS: Target date: 07/04/2021  Patient will be I with initial HEP in order to progress with therapy. Baseline: HEP provided at eval Goal status: INITIAL  2.  Patient will report </= ***/  10 pain level and be able to demonstrate appropriate posture  Baseline: ***/10 pain and exhibits poor posture Goal status: INITIAL  LONG TERM  GOALS: Target date: 07/25/2021  Patient will be I with final HEP to maintain progress from PT. Baseline: HEP provided at eval Goal status: INITIAL  2.  Patient will report NDI </= ***/50 in order to indicate improved functional ability Baseline: ***/50 NDI Goal status: INITIAL  3.  Patient will report </= ***/10 pain level in order to reduce functional limitations Baseline: ***/10 Goal status: INITIAL  4.  Patient will exhibit periscapular strength grossly >/= 4/5 MMT in order to improve postural control Baseline: periscapular strength gross 4-/5 MMT Goal status: INITIAL   PLAN: PT FREQUENCY: 1x/week  PT DURATION: 6 weeks  PLANNED INTERVENTIONS: Therapeutic exercises, Therapeutic activity, Neuromuscular re-education, Balance training, Gait training, Patient/Family education, Joint manipulation, Joint mobilization, Aquatic Therapy, Dry Needling, Electrical stimulation, Spinal manipulation, Spinal mobilization, Cryotherapy, Moist heat, Taping, and Manual therapy  PLAN FOR NEXT SESSION: Review HEP and progress PRN, manual/dry needling for upper trap and cervical region, postural control and endurance   Hilda Blades, PT, DPT, LAT, ATC 06/13/21  2:13 PM Phone: [phone] Fax: [phone]

## 2021-06-14 ENCOUNTER — Other Ambulatory Visit: Payer: Self-pay

## 2021-06-14 ENCOUNTER — Ambulatory Visit: Payer: 59 | Attending: Plastic Surgery | Admitting: Physical Therapy

## 2021-06-14 ENCOUNTER — Encounter: Payer: Self-pay | Admitting: Physical Therapy

## 2021-06-14 DIAGNOSIS — G8929 Other chronic pain: Secondary | ICD-10-CM | POA: Insufficient documentation

## 2021-06-14 DIAGNOSIS — M542 Cervicalgia: Secondary | ICD-10-CM | POA: Diagnosis not present

## 2021-06-14 DIAGNOSIS — M546 Pain in thoracic spine: Secondary | ICD-10-CM | POA: Insufficient documentation

## 2021-06-14 DIAGNOSIS — R293 Abnormal posture: Secondary | ICD-10-CM | POA: Insufficient documentation

## 2021-06-14 DIAGNOSIS — N62 Hypertrophy of breast: Secondary | ICD-10-CM | POA: Diagnosis not present

## 2021-06-14 DIAGNOSIS — M6281 Muscle weakness (generalized): Secondary | ICD-10-CM | POA: Diagnosis present

## 2021-06-14 NOTE — Patient Instructions (Signed)
Access Code: [email or web address] ?URL: [email or web address] ?Date: 06/14/2021 ?Prepared by: Rosana Hoes ? ?Exercises ?Supine Cervical Retraction with Towel - 1-2 x daily - 10 reps - 5 seconds hold ?Supine Posterior Pelvic Tilt - 1-2 x daily - 10 reps - 5 seconds hold ?Sidelying Thoracic Lumbar Rotation - 1-2 x daily - 7 x weekly - 10 reps - 5 seconds hold ?Thoracic Extension Mobilization on Foam Roll - 1-2 x daily - 7 x weekly - 3 sets - 10 reps ?Standing Shoulder Horizontal Abduction with Resistance - 1 x daily - 7 x weekly - 2 sets - 10 reps ?Standing Bilateral Low Shoulder Row with Anchored Resistance - 1 x daily - 7 x weekly - 2 sets - 15 reps ? ?

## 2021-06-20 NOTE — Therapy (Signed)
?OUTPATIENT PHYSICAL THERAPY TREATMENT NOTE ? ? ?Patient Name: Angelica Spencer ?MRN: [id number] ?DOB:May 04, 1990, 31 y.o., female ?Today's Date: 06/21/2021 ? ?PCP: Dorisann FramesJones, Amanda, FNP ?REFERRING PROVIDER: Dorisann FramesJones, Amanda, FNP ? ? PT End of Session - 06/21/21 1215   ? ? Visit Number 2   ? Number of Visits 7   ? Date for PT Re-Evaluation 07/26/21   ? Authorization Type UHC   ? PT Start Time 1215   ? PT Stop Time 1300   ? PT Time Calculation (min) 45 min   ? Activity Tolerance Patient tolerated treatment well   ? [facility] assessed/performed   ? ?  ?  ? ?  ? ? ?Past Medical History:  ?Diagnosis Date  ? Migraines   ? ?History reviewed. No pertinent surgical history. ?Patient Active Problem List  ? Diagnosis Date Noted  ? Symptomatic mammary hypertrophy 05/20/2021  ? Back pain 05/20/2021  ? Neck pain 05/20/2021  ? Encounter for induction of labor 09/03/2020  ? Postpartum care following vaginal delivery 6/3 09/03/2020  ? SVD (spontaneous vaginal delivery) 09/03/2020  ? RhD negative 09/03/2020  ? Gestational hypertension 09/02/2020  ? ? ?REFERRING PROVIDER: Peggye Formillingham, Claire S, DO ?  ?REFERRING DIAG: Symptomatic mammary hypertrophy, Chronic bilateral thoracic back pain, Neck pain ? ?THERAPY DIAG:  ?Cervicalgia ? ?Pain in thoracic spine ? ?Muscle weakness (generalized) ? ?Abnormal posture ? ?PERTINENT HISTORY: None ? ?PRECAUTIONS: None ? ?SUBJECTIVE: Patient reports the exercises are helping, she feels better at the end of the day. ? ?PAIN:  ?Are you having pain?  ?Yes: NPRS scale: 1/10 ?Pain location: Bilateral neck and upper back region ?Pain description: Sore and tense ?Aggravating factors: Running, high intensity exercises, jumping, picking up kids from the ground ?Relieving factors: Massage every month, rolling shoulder back, improving posture, chirp wheels/foam roller ? ?PATIENT GOALS: Pain relief and improve posture ? ? ?OBJECTIVE:  ?PATIENT SURVEYS:  ?NDI 8/50 ?  ?POSTURE:  ?Rounded shoulder,  forward head posture ?  ?PALPATION: ?Tender to palpation bilateral upper trap, suboccipital regions   ?  ?CERVICAL ROM:  ?  ?Active ROM A/PROM (deg) ?06/14/2021  ?06/21/2021  ?Flexion 60   ?Extension 40   ?Right lateral flexion 40 40  ?Left lateral flexion 30 45  ?Right rotation 75   ?Left rotation 70   ?Patient reports right upper trap tightness ?  ?UE ROM: ?                      Thoracic AROM grossly limited in all directions ?  ?UE MMT: ?  ?MMT Right ?06/14/2021 Left ?06/14/2021  ?Shoulder flexion 5 5  ?Shoulder extension 5 5  ?Shoulder abduction 5 5  ?Shoulder internal rotation 5 5  ?Shoulder external rotation 5 5  ?Periscapular musculature 4- 4-  ?  ?CERVICAL SPECIAL TESTS:  ?DNF endurance: 14 seconds ?  ?  ?TODAY'S TREATMENT:  ?[facility]:                                                DATE: 06/21/2021 ?Therapeutic Exercise: ?UBE L1 x 4 min (2 fwd/bwd) while taking subjective ?Supine chin tuck with small towel roll 10 x 5 sec ?Supine scapular protraction/retraction on FR x 10 ?Supine horizontal abduction with green on FR x 10 ?Supine diagonals with green on FR x 10 each ?Sidelying  thoracic rotation x 10 each ?Seated double ER and scap retraction with green x 10 ?Thoracic extension stretch at wall x 5 ?Manual: ?Skilled palpation and monitoring of muscle tension while performing TPDN treatment ?Suboccipital release with gentle manual traction x 3 bouts ?Trigger Point Dry Needling Treatment: ?Pre-treatment instruction: Patient instructed on dry needling rationale, procedures, and possible side effects including pain during treatment (achy,cramping feeling), bruising, drop of blood, lightheadedness, nausea, sweating. ?Patient Consent Given: Yes ?Education handout provided: No ?Muscles treated: Bilateral upper trap and suboccipitals  ?Needle size and number: .30x76mm x 2 and .25x19mm x 2 ?Electrical stimulation performed: No ?Parameters: N/A ?Treatment response/outcome: Twitch response elicited, Palpable  decrease in muscle tension, and patient demonstrating improve cervical side bend ?Post-treatment instructions: Patient instructed to expect possible mild to moderate muscle soreness later today and/or tomorrow. Patient instructed in methods to reduce muscle soreness and to continue prescribed HEP. If patient was dry needled over the lung field, patient was instructed on signs and symptoms of pneumothorax and, however unlikely, to see immediate medical attention should they occur. Patient was also educated on signs and symptoms of infection and to seek medical attention should they occur. Patient verbalized understanding of these instructions and education. ? ? ?[facility] Adult PT Treatment:                                                DATE: 06/14/2021 ?Therapeutic Exercise: ?Supine chin tuck with small towel roll 5 x 5 sec - combined with PPT ?Sidelying thoracic rotation 5 x 5 sec each ?Supine thoracic extension mobs with FR x 5  ?Shoulder horizontal abduction with green x 10 ?Row with green x 10 ?  ?PATIENT EDUCATION:  ?Education details: HEP, TPDN ?Person educated: Patient ?Education method: Explanation, Demonstration, Tactile cues, Verbal cues ?Education comprehension: verbalized understanding, returned demonstration, verbal cues required, tactile cues required, and needs further education ?  ?HOME EXERCISE PROGRAM: ?Access Code: [email or web address] ?  ?  ?ASSESSMENT: ?CLINICAL IMPRESSION: ?Patient tolerated therapy well with no adverse effects. Performed TPDN this visit with twitch responses elicited and patient reporting initial benefit. Therapy continued focus on progressing postural control. Patient tolerated increased resistance with exercises and did not report any increase in pain with therapy. She did require cueing for postural correction with exercises, and was able to correct following cues. No changes made to HEP this visit. Patient would benefit from continued skilled PT to progress her postural strength and  endurance in order to reduce pain and maximize functional ability. ?  ?  ?OBJECTIVE IMPAIRMENTS decreased strength, postural dysfunction, and pain.  ?  ?ACTIVITY LIMITATIONS community activity, meal prep, occupation, and shopping.  ?  ?PERSONAL FACTORS Past/current experiences and Time since onset of injury/illness/exacerbation are also affecting patient's functional outcome.  ?  ?  ?GOALS: ?Goals reviewed with patient? Yes ?  ?SHORT TERM GOALS: Target date: 07/05/2021 ?  ?Patient will be I with initial HEP in order to progress with therapy. ?Baseline: HEP provided at eval ?Goal status: INITIAL ?  ?2.  Patient will be able to demonstrate appropriate posture and will report 50% improvement in headache frequency ?Baseline: patient exhibits rounded shoulder and forward head posture, and moderate headaches that come frequently ?Goal status: INITIAL ?  ?LONG TERM GOALS: Target date: 07/26/2021 ?  ?Patient will be I with final HEP to maintain progress from PT. ?Baseline: HEP provided  at eval ?Goal status: INITIAL ?  ?2.  Patient will report NDI </= 2/50 in order to indicate improved functional ability ?Baseline: 8/50 NDI ?Goal status: INITIAL ?  ?3.  Patient will report no increased pain with sitting at work and will report slight headaches which come infrequently ?Baseline: 1/10 with work related tasks and moderate headaches that come frequently ?Goal status: INITIAL ?  ?4.  Patient will exhibit periscapular strength grossly >/= 4/5 MMT in order to improve postural control ?Baseline: periscapular strength gross 4-/5 MMT ?Goal status: INITIAL ?  ?  ?PLAN: ?PT FREQUENCY: 1x/week ?  ?PT DURATION: 6 weeks ?  ?PLANNED INTERVENTIONS: Therapeutic exercises, Therapeutic activity, Neuromuscular re-education, Balance training, Gait training, Patient/Family education, Joint manipulation, Joint mobilization, Aquatic Therapy, Dry Needling, Electrical stimulation, Spinal manipulation, Spinal mobilization, Cryotherapy, Moist heat, Taping,  and Manual therapy ?  ?PLAN FOR NEXT SESSION: Review HEP and progress PRN, manual/dry needling for upper trap (right) and cervical region, postural control and endurance ? ? ? ?Rosana Hoes, PT, DPT, LAT,

## 2021-06-21 ENCOUNTER — Ambulatory Visit: Payer: 59 | Admitting: Physical Therapy

## 2021-06-21 ENCOUNTER — Encounter: Payer: Self-pay | Admitting: Physical Therapy

## 2021-06-21 ENCOUNTER — Other Ambulatory Visit: Payer: Self-pay

## 2021-06-21 DIAGNOSIS — M542 Cervicalgia: Secondary | ICD-10-CM | POA: Diagnosis not present

## 2021-06-21 DIAGNOSIS — R293 Abnormal posture: Secondary | ICD-10-CM

## 2021-06-21 DIAGNOSIS — M6281 Muscle weakness (generalized): Secondary | ICD-10-CM

## 2021-06-21 DIAGNOSIS — M546 Pain in thoracic spine: Secondary | ICD-10-CM

## 2021-07-06 ENCOUNTER — Ambulatory Visit: Payer: 59 | Admitting: Physical Therapy

## 2021-07-11 NOTE — Therapy (Signed)
?OUTPATIENT PHYSICAL THERAPY TREATMENT NOTE ? ? ?Patient Name: Angelica Spencer ?MRN: [id number] ?DOB:[birth year], 31 y.o., female ?Today's Date: 07/12/2021 ? ?PCP: Gavin Potters, FNP ?REFERRING PROVIDER: Gavin Potters, FNP ? ? PT End of Session - 07/12/21 1220   ? ? Visit Number 3   ? Number of Visits 7   ? Date for PT Re-Evaluation 07/26/21   ? Authorization Type UHC   ? PT Start Time 1215   ? PT Stop Time 9767   ? PT Time Calculation (min) 40 min   ? Activity Tolerance Patient tolerated treatment well   ? Behavior During Therapy [facility] for tasks assessed/performed   ? ?  ?  ? ?  ? ? ? ?Past Medical History:  ?Diagnosis Date  ? Migraines   ? ?History reviewed. No pertinent surgical history. ?Patient Active Problem List  ? Diagnosis Date Noted  ? Symptomatic mammary hypertrophy 05/20/2021  ? Back pain 05/20/2021  ? Neck pain 05/20/2021  ? Encounter for induction of labor 09/03/2020  ? Postpartum care following vaginal delivery 6/3 09/03/2020  ? SVD (spontaneous vaginal delivery) 09/03/2020  ? RhD negative 09/03/2020  ? Gestational hypertension 09/02/2020  ? ? ?REFERRING PROVIDER: Wallace Going, DO ?  ?REFERRING DIAG: Symptomatic mammary hypertrophy, Chronic bilateral thoracic back pain, Neck pain ? ?THERAPY DIAG:  ?Cervicalgia ? ?Pain in thoracic spine ? ?Muscle weakness (generalized) ? ?Abnormal posture ? ?PERTINENT HISTORY: None ? ?PRECAUTIONS: None ? ?SUBJECTIVE: Patient reports her who back is pretty sore, she has been doing a lot of driving and sitting at her desk. Upper back around shoulders is more sore than lower back. ? ?PAIN:  ?Are you having pain?  ?Yes: NPRS scale: 2/10 ?Pain location: Bilateral neck and upper back region ?Pain description: Sore and tense, dull ache ?Aggravating factors: Running, high intensity exercises, jumping, picking up kids from the ground ?Relieving factors: Massage every month, rolling shoulder back, improving posture, chirp wheels/foam roller ? ?PATIENT GOALS: Pain relief and  improve posture ? ? ?OBJECTIVE:  ?PATIENT SURVEYS:  ?NDI 8/50 ?  ?POSTURE:  ?Rounded shoulder, forward head posture ?  ?PALPATION: ?Tender to palpation bilateral upper trap, suboccipital regions   ?  ?CERVICAL ROM:  ?  ?Active ROM A/PROM (deg) ?06/14/2021  ?06/21/2021  ?Flexion 60   ?Extension 40   ?Right lateral flexion 40 40  ?Left lateral flexion 30 45  ?Right rotation 75   ?Left rotation 70   ?Patient reports right upper trap tightness ?  ?UE ROM: ?                      Thoracic AROM grossly limited in all directions ?  ?UE MMT: ?  ?MMT Right ?06/14/2021 Left ?06/14/2021 Rt / Lt ?07/12/2021  ?Shoulder flexion 5 5   ?Shoulder extension 5 5   ?Shoulder abduction 5 5   ?Shoulder internal rotation 5 5   ?Shoulder external rotation 5 5   ?Periscapular musculature 4- 4- 4- / 4-  ?  ?CERVICAL SPECIAL TESTS:  ?DNF endurance: 14 seconds ?  ?  ?TODAY'S TREATMENT:  ?[facility] Adult PT Treatment:                                                DATE: 07/12/2021 ?Therapeutic Exercise: ?UBE L1 x 4 min (2 fwd/bwd) while taking subjective ?Sidelying thoracic rotation x 10 each ?  Supine thoracic extension mobs over FR 3 x 5 ?Child's pose thoracic extension stretch with FR x 5 ?Supine chin tuck with small towel roll 2 x 5 with 5 sec ?Supine horizontal abduction with green 2 x 10 ?Seated double ER and scap retraction with green 2 x 10 ?Springboard bar pull down with yellow springs 2 x 10 ?Row with blue 2 x 15 ?Lat pull down with FM 10# 2 x 10 ?Supine posterior pelvic tilts 10 x 5 sec ? ? ?[facility] Adult PT Treatment:                                                DATE: 06/21/2021 ?Therapeutic Exercise: ?UBE L1 x 4 min (2 fwd/bwd) while taking subjective ?Supine chin tuck with small towel roll 10 x 5 sec ?Supine scapular protraction/retraction on FR x 10 ?Supine horizontal abduction with green on FR x 10 ?Supine diagonals with green on FR x 10 each ?Sidelying thoracic rotation x 10 each ?Seated double ER and scap retraction with green x 10 ?Thoracic  extension stretch at wall x 5 ?Manual: ?Skilled palpation and monitoring of muscle tension while performing TPDN treatment ?Suboccipital release with gentle manual traction x 3 bouts ?Trigger Point Dry Needling Treatment: ?Pre-treatment instruction: Patient instructed on dry needling rationale, procedures, and possible side effects including pain during treatment (achy,cramping feeling), bruising, drop of blood, lightheadedness, nausea, sweating. ?Patient Consent Given: Yes ?Education handout provided: No ?Muscles treated: Bilateral upper trap and suboccipitals  ?Needle size and number: .30x68m x 2 and .25x560mx 2 ?Electrical stimulation performed: No ?Parameters: N/A ?Treatment response/outcome: Twitch response elicited, Palpable decrease in muscle tension, and patient demonstrating improve cervical side bend ?Post-treatment instructions: Patient instructed to expect possible mild to moderate muscle soreness later today and/or tomorrow. Patient instructed in methods to reduce muscle soreness and to continue prescribed HEP. If patient was dry needled over the lung field, patient was instructed on signs and symptoms of pneumothorax and, however unlikely, to see immediate medical attention should they occur. Patient was also educated on signs and symptoms of infection and to seek medical attention should they occur. Patient verbalized understanding of these instructions and education. ? ?[facility]:                                                DATE: 06/14/2021 ?Therapeutic Exercise: ?Supine chin tuck with small towel roll 5 x 5 sec - combined with PPT ?Sidelying thoracic rotation 5 x 5 sec each ?Supine thoracic extension mobs with FR x 5  ?Shoulder horizontal abduction with green x 10 ?Row with green x 10 ?  ?PATIENT EDUCATION:  ?Education details: HEP ?Person educated: Patient ?Education method: Explanation, Demonstration, Tactile cues, Verbal cues ?Education comprehension: verbalized understanding,  returned demonstration, verbal cues required, tactile cues required, and needs further education ?  ?HOME EXERCISE PROGRAM: ?Access Code: [email or web address] ?  ?  ?ASSESSMENT: ?CLINICAL IMPRESSION: ?Patient tolerated therapy well with no adverse effects. Therapy focused on spinal mobility and progression of postural and core strengthening. She tolerated therapy well without any increase in pain, and reporting feeling looser post therapy. She reports she responded well to dry needling last visit so can resume treatment at next visit. No  changes to HEP this visit. Patient would benefit from continued skilled PT to progress her postural strength and endurance in order to reduce pain and maximize functional ability. ?  ?  ?OBJECTIVE IMPAIRMENTS decreased strength, postural dysfunction, and pain.  ?  ?ACTIVITY LIMITATIONS community activity, meal prep, occupation, and shopping.  ?  ?PERSONAL FACTORS Past/current experiences and Time since onset of injury/illness/exacerbation are also affecting patient's functional outcome.  ?  ?  ?GOALS: ?Goals reviewed with patient? Yes ?  ?SHORT TERM GOALS: Target date: 07/05/2021 ?  ?Patient will be I with initial HEP in order to progress with therapy. ?Baseline: HEP provided at eval ?07/12/2021: independent with initial HEP ?Goal status: MET ?  ?2.  Patient will be able to demonstrate appropriate posture and will report 50% improvement in headache frequency ?Baseline: patient exhibits rounded shoulder and forward head posture, and moderate headaches that come frequently ?07/12/2021: patient able to demonstrate appropriate posture and reports improved headaches ?Goal status: ONGOING ?  ?LONG TERM GOALS: Target date: 07/26/2021 ?  ?Patient will be I with final HEP to maintain progress from PT. ?Baseline: HEP provided at eval ?Goal status: INITIAL ?  ?2.  Patient will report NDI </= 2/50 in order to indicate improved functional ability ?Baseline: 8/50 NDI ?Goal status: INITIAL ?  ?3.  Patient will  report no increased pain with sitting at work and will report slight headaches which come infrequently ?Baseline: 1/10 with work related tasks and moderate headaches that come frequently ?Goal status: INIT

## 2021-07-12 ENCOUNTER — Encounter: Payer: Self-pay | Admitting: Physical Therapy

## 2021-07-12 ENCOUNTER — Other Ambulatory Visit: Payer: Self-pay

## 2021-07-12 ENCOUNTER — Ambulatory Visit: Payer: 59 | Attending: Plastic Surgery | Admitting: Physical Therapy

## 2021-07-12 DIAGNOSIS — R293 Abnormal posture: Secondary | ICD-10-CM | POA: Insufficient documentation

## 2021-07-12 DIAGNOSIS — M542 Cervicalgia: Secondary | ICD-10-CM | POA: Insufficient documentation

## 2021-07-12 DIAGNOSIS — M546 Pain in thoracic spine: Secondary | ICD-10-CM | POA: Diagnosis present

## 2021-07-12 DIAGNOSIS — M6281 Muscle weakness (generalized): Secondary | ICD-10-CM | POA: Diagnosis present

## 2021-07-19 ENCOUNTER — Ambulatory Visit: Payer: 59 | Admitting: Physical Therapy

## 2021-07-25 NOTE — Therapy (Signed)
?OUTPATIENT PHYSICAL THERAPY TREATMENT NOTE ? ? ?Patient Name: Angelica Spencer ?MRN: [id number] ?DOB:[birth year], 31 y.o., female ?Today's Date: 07/26/2021 ? ?PCP: Gavin Potters, FNP ?REFERRING PROVIDER: Gavin Potters, FNP ? ? PT End of Session - 07/26/21 1222   ? ? Visit Number 4   ? Number of Visits 7   ? Date for PT Re-Evaluation 08/16/21   ? Authorization Type UHC   ? PT Start Time 1215   ? PT Stop Time 2831   ? PT Time Calculation (min) 40 min   ? Activity Tolerance Patient tolerated treatment well   ? Behavior During Therapy [facility] for tasks assessed/performed   ? ?  ?  ? ?  ? ? ? ? ?Past Medical History:  ?Diagnosis Date  ? Migraines   ? ?History reviewed. No pertinent surgical history. ?Patient Active Problem List  ? Diagnosis Date Noted  ? Symptomatic mammary hypertrophy 05/20/2021  ? Back pain 05/20/2021  ? Neck pain 05/20/2021  ? Encounter for induction of labor 09/03/2020  ? Postpartum care following vaginal delivery 6/3 09/03/2020  ? SVD (spontaneous vaginal delivery) 09/03/2020  ? RhD negative 09/03/2020  ? Gestational hypertension 09/02/2020  ? ? ?REFERRING PROVIDER: Wallace Going, DO ?  ?REFERRING DIAG: Symptomatic mammary hypertrophy, Chronic bilateral thoracic back pain, Neck pain ? ?THERAPY DIAG:  ?Cervicalgia ? ?Pain in thoracic spine ? ?Muscle weakness (generalized) ? ?Abnormal posture ? ?PERTINENT HISTORY: None ? ?PRECAUTIONS: None ? ?SUBJECTIVE: Patient reports improvement in symptoms but does continue to have increase in pain with extended periods of sitting while at work and notes continued headaches.  ? ?PAIN:  ?Are you having pain?  ?Yes: NPRS scale: 1/10 ?Pain location: Bilateral neck and upper back region ?Pain description: Sore and tense, dull ache ?Aggravating factors: Running, high intensity exercises, jumping, picking up kids from the ground ?Relieving factors: Massage every month, rolling shoulder back, improving posture, chirp wheels/foam roller ? ?PATIENT GOALS: Pain relief and  improve posture ? ? ?OBJECTIVE:  ?PATIENT SURVEYS:  ?NDI 8/50 - 07/26/2021 (8/50 - 06/14/2021 eval) ?  ?POSTURE:  ?Rounded shoulder, forward head posture ?  ?PALPATION: ?Tender to palpation bilateral upper trap, suboccipital regions   ?  ?CERVICAL ROM:  ?  ?Active ROM A/PROM (deg) ?06/14/2021  ?06/21/2021   ?Flexion 60    ?Extension 40    ?Right lateral flexion 40 40 45  ?Left lateral flexion 30 45 45  ?Right rotation 75  75  ?Left rotation 70  75  ?Patient reports right upper trap tightness ?  ?UE ROM: ?                      Thoracic AROM grossly limited in all directions ?  ?UE MMT: ?  ?MMT Right ?06/14/2021 Left ?06/14/2021 Rt / Lt ?07/12/2021 Rt / Lt ?07/26/2021  ?Shoulder flexion 5 5    ?Shoulder extension 5 5    ?Shoulder abduction 5 5    ?Shoulder internal rotation 5 5    ?Shoulder external rotation 5 5    ?Periscapular musculature 4- 4- 4- / 4- 4- / 4-  ?  ?CERVICAL SPECIAL TESTS:  ?DNF endurance: 18 seconds - 07/26/2021 (14 seconds - 06/14/2021 eval) ?  ?  ?TODAY'S TREATMENT:  ?[place] Adult PT Treatment:  DATE: 07/26/2021 ?Therapeutic Exercise: ?UBE L1 x 4 min (2 fwd/bwd) while taking subjective ?Sidelying thoracic rotation x 10 each ?Machine row with 25# 2 x 10 ?Lat pull down machine 25# 2 x 10 ?Supine horizontal abduction with blue 2 x 10 ?Bridge 2 x 10 ?Seated double ER and scap retraction with blue 2 x 10 ?Banded pull down with blue 2 x 10 ? ? ?[facility] Adult PT Treatment:                                                DATE: 07/12/2021 ?Therapeutic Exercise: ?UBE L1 x 4 min (2 fwd/bwd) while taking subjective ?Sidelying thoracic rotation x 10 each ?Supine thoracic extension mobs over FR 3 x 5 ?Child's pose thoracic extension stretch with FR x 5 ?Supine chin tuck with small towel roll 2 x 5 with 5 sec ?Supine horizontal abduction with green 2 x 10 ?Seated double ER and scap retraction with green 2 x 10 ?Springboard bar pull down with yellow springs 2 x 10 ?Row with blue 2 x  15 ?Lat pull down with FM 10# 2 x 10 ?Supine posterior pelvic tilts 10 x 5 sec ? ?[facility] Adult PT Treatment:                                                DATE: 06/21/2021 ?Therapeutic Exercise: ?UBE L1 x 4 min (2 fwd/bwd) while taking subjective ?Supine chin tuck with small towel roll 10 x 5 sec ?Supine scapular protraction/retraction on FR x 10 ?Supine horizontal abduction with green on FR x 10 ?Supine diagonals with green on FR x 10 each ?Sidelying thoracic rotation x 10 each ?Seated double ER and scap retraction with green x 10 ?Thoracic extension stretch at wall x 5 ?Manual: ?Skilled palpation and monitoring of muscle tension while performing TPDN treatment ?Suboccipital release with gentle manual traction x 3 bouts ?Trigger Point Dry Needling Treatment: ?Pre-treatment instruction: Patient instructed on dry needling rationale, procedures, and possible side effects including pain during treatment (achy,cramping feeling), bruising, drop of blood, lightheadedness, nausea, sweating. ?Patient Consent Given: Yes ?Education handout provided: No ?Muscles treated: Bilateral upper trap and suboccipitals  ?Needle size and number: .30x8m x 2 and .25x572mx 2 ?Electrical stimulation performed: No ?Parameters: N/A ?Treatment response/outcome: Twitch response elicited, Palpable decrease in muscle tension, and patient demonstrating improve cervical side bend ?Post-treatment instructions: Patient instructed to expect possible mild to moderate muscle soreness later today and/or tomorrow. Patient instructed in methods to reduce muscle soreness and to continue prescribed HEP. If patient was dry needled over the lung field, patient was instructed on signs and symptoms of pneumothorax and, however unlikely, to see immediate medical attention should they occur. Patient was also educated on signs and symptoms of infection and to seek medical attention should they occur. Patient verbalized understanding of these instructions and  education. ?  ?PATIENT EDUCATION:  ?Education details: POC extension and progress toward goals, HEP ?Person educated: Patient ?Education method: Explanation, Demonstration, Tactile cues, Verbal cues ?Education comprehension: verbalized understanding, returned demonstration, verbal cues required, tactile cues required, and needs further education ?  ?HOME EXERCISE PROGRAM: ?Access Code: [email or web address] ?  ?  ?ASSESSMENT: ?CLINICAL IMPRESSION: ?Patient tolerated therapy well with no adverse effects. Patient  demonstrates improvement in cervical range of motion but continues to exhibit gross postural strength and endurance deficit and reports no improvement functional ability on her NDI. She continues to report moderate headaches that come frequently and increased pain that can limit her sitting ability. Therapy continues to focus on progress of cervicothoracic mobility and progress of postural strength and endurance. She is tolerating increased resistance with exercises in therapy without any increase in pain and updated her HEP this visit to continue progression of strengthening for home. Patient would benefit from continued skilled PT to progress her postural strength and endurance in order to reduce pain and maximize functional ability, so will extend PT POC for 3 more weeks at frequency of 1 x/week. ?  ?  ?OBJECTIVE IMPAIRMENTS decreased strength, postural dysfunction, and pain.  ?  ?ACTIVITY LIMITATIONS community activity, meal prep, occupation, and shopping.  ?  ?PERSONAL FACTORS Past/current experiences and Time since onset of injury/illness/exacerbation are also affecting patient's functional outcome.  ?  ?  ?GOALS: ?Goals reviewed with patient? Yes ?  ?SHORT TERM GOALS: Target date: 08/09/2021 ?  ?Patient will be I with initial HEP in order to progress with therapy. ?Baseline: HEP provided at eval ?07/12/2021: independent with initial HEP ?Goal status: MET ?  ?2.  Patient will be able to demonstrate appropriate posture  and will report 50% improvement in headache frequency ?Baseline: patient exhibits rounded shoulder and forward head posture, and moderate headaches that come frequently ?07/12/2021: patient able to demonstrate

## 2021-07-26 ENCOUNTER — Ambulatory Visit: Payer: 59 | Admitting: Physical Therapy

## 2021-07-26 ENCOUNTER — Encounter: Payer: Self-pay | Admitting: Physical Therapy

## 2021-07-26 ENCOUNTER — Other Ambulatory Visit: Payer: Self-pay

## 2021-07-26 DIAGNOSIS — M6281 Muscle weakness (generalized): Secondary | ICD-10-CM

## 2021-07-26 DIAGNOSIS — R293 Abnormal posture: Secondary | ICD-10-CM

## 2021-07-26 DIAGNOSIS — M542 Cervicalgia: Secondary | ICD-10-CM

## 2021-07-26 DIAGNOSIS — M546 Pain in thoracic spine: Secondary | ICD-10-CM

## 2021-07-26 NOTE — Patient Instructions (Signed)
Access Code: [email or web address] ?URL: [email or web address] ?Date: 07/26/2021 ?Prepared by: Rosana Hoes ? ?Exercises ?- Supine Cervical Retraction with Towel  - 1-2 x daily - 10 reps - 5 seconds hold ?- Supine Posterior Pelvic Tilt  - 1-2 x daily - 10 reps - 5 seconds hold ?- Sidelying Thoracic Lumbar Rotation  - 1-2 x daily - 7 x weekly - 10 reps - 5 seconds hold ?- Thoracic Extension Mobilization on Foam Roll  - 1-2 x daily - 7 x weekly - 3 sets - 10 reps ?- Standing Shoulder Horizontal Abduction with Resistance  - 1 x daily - 7 x weekly - 2 sets - 10 reps ?- Shoulder External Rotation and Scapular Retraction with Resistance  - 1 x daily - 7 x weekly - 2 sets - 10 reps ?- Standing Bilateral Low Shoulder Row with Anchored Resistance  - 1 x daily - 7 x weekly - 2 sets - 15 reps ?

## 2021-07-29 ENCOUNTER — Telehealth: Payer: Self-pay | Admitting: Family Medicine

## 2021-07-29 ENCOUNTER — Telehealth: Payer: Self-pay | Admitting: Plastic Surgery

## 2021-07-29 ENCOUNTER — Ambulatory Visit
Admission: EM | Admit: 2021-07-29 | Discharge: 2021-07-29 | Disposition: A | Discharge status: Home / Self Care | Payer: 59

## 2021-07-29 DIAGNOSIS — R1310 Dysphagia, unspecified: Secondary | ICD-10-CM

## 2021-07-29 DIAGNOSIS — R0981 Nasal congestion: Secondary | ICD-10-CM | POA: Diagnosis not present

## 2021-07-29 DIAGNOSIS — H9202 Otalgia, left ear: Secondary | ICD-10-CM

## 2021-07-29 NOTE — Telephone Encounter (Signed)
Patient called to advise provider she will be finished with her 6 weeks of PT in two weeks; requesting for provider to start the next part of the process. Please advise at [phone]. ?

## 2021-07-29 NOTE — ED Provider Notes (Signed)
?[facility] ? ? ? ?CSN: [id number] ?Arrival date & time: 07/29/21  1256 ? ? ?  ? ?History   ?Chief Complaint ?Chief Complaint  ?Patient presents with  ? Otalgia  ? ? ?HPI ?Angelica Spencer is a 31 y.o. female.  ? ?Patient relates for the past 24 hours with progressive left ear pain and tenderness.  Patient also with associated sore throat intermittently when swallowing.  Patient has allergies and minimal sinus congestion.  Patient without fever or chills.  Patient indicates taking ibuprofen otc 800mg  with minimal relief. ? ? ?Otalgia ? ?Past Medical History:  ?Diagnosis Date  ? Migraines   ? ? ?Patient Active Problem List  ? Diagnosis Date Noted  ? Symptomatic mammary hypertrophy 05/20/2021  ? Back pain 05/20/2021  ? Neck pain 05/20/2021  ? Encounter for induction of labor 09/03/2020  ? Postpartum care following vaginal delivery 6/3 09/03/2020  ? SVD (spontaneous vaginal delivery) 09/03/2020  ? RhD negative 09/03/2020  ? Gestational hypertension 09/02/2020  ? ? ?History reviewed. No pertinent surgical history. ? ?OB History   ? ? Gravida  ?2  ? Para  ?2  ? Term  ?2  ? Preterm  ?   ? AB  ?   ? Living  ?2  ?  ? ? SAB  ?   ? IAB  ?   ? Ectopic  ?   ? Multiple  ?0  ? Live Births  ?2  ?   ?  ?  ? ? ? ?Home Medications   ? ?Prior to Admission medications   ?Medication Sig Start Date End Date Taking? Authorizing Provider  ?acetaminophen (TYLENOL) 500 MG tablet Take 2 tablets (1,000 mg total) by mouth every 6 (six) hours as needed. ?Patient taking differently: Take 1,000 mg by mouth every 6 (six) hours as needed for mild pain. 09/04/20   Law, Cassandra A, DO  ?BLISOVI FE 1/20 1-20 MG-MCG tablet Take 1 tablet by mouth daily. 12/05/20   [provider]  ?citalopram (CELEXA) 40 MG tablet Take 40 mg by mouth daily.    [provider]  ?famotidine (PEPCID) 20 MG tablet Take 20 mg by mouth 2 (two) times daily as needed for heartburn.    [provider]  ?ibuprofen (ADVIL) 600 MG tablet Take 1  tablet (600 mg total) by mouth every 6 (six) hours as needed. ?Patient taking differently: Take 600 mg by mouth every 6 (six) hours as needed for mild pain. 09/04/20   Law, Cassandra A, DO  ? ? ?Family History ?History reviewed. No pertinent family history. ? ?Social History ?Social History  ? ?Tobacco Use  ? Smoking status: Never  ? Smokeless tobacco: Never  ?Vaping Use  ? Vaping Use: Never used  ?Substance Use Topics  ? Alcohol use: Never  ? Drug use: Never  ? ? ? ?Allergies   ?Imitrex [sumatriptan] ? ? ?Review of Systems ?Review of Systems  ?HENT:  Positive for ear pain, postnasal drip and sinus pressure. Negative for sinus pain.   ? ? ?Physical Exam ?Triage Vital Signs ?ED Triage Vitals  ?Enc Vitals Group  ?   BP 07/29/21 1338 (!) 131/94  ?   Pulse Rate 07/29/21 1338 83  ?   Resp 07/29/21 1338 16  ?   Temp 07/29/21 1338 98.5 ?F (36.9 ?C)  ?   Temp Source 07/29/21 1338 Oral  ?   SpO2 07/29/21 1338 97 %  ?   Weight --   ?   Height --   ?  Head Circumference --   ?   Peak Flow --   ?   Pain Score 07/29/21 1340 2  ?   Pain Loc --   ?   Pain Edu? --   ?   Excl. in GC? --   ? ?No data found. ? ?Updated Vital Signs ?BP (!) 131/94 (BP Location: Left Arm)   Pulse 83   Temp 98.5 ?F (36.9 ?C) (Oral)   Resp 16   LMP 06/23/2021   SpO2 97%  ? ?Visual Acuity ?Right Eye Distance:   ?Left Eye Distance:   ?Bilateral Distance:   ? ?Right Eye Near:   ?Left Eye Near:    ?Bilateral Near:    ? ?Physical Exam ?Constitutional:   ?   Appearance: Normal appearance.  ?HENT:  ?   Right Ear: Tympanic membrane, ear canal and external ear normal.  ?   Left Ear: Tympanic membrane, ear canal and external ear normal.  ?   Ears:  ?   Comments: Left TM with mild clear fluid present, no redness. ?   Mouth/Throat:  ?   Mouth: Mucous membranes are moist.  ?   Comments: Throat normal without redness. ?Neurological:  ?   Mental Status: She is alert.  ? ? ? ?UC Treatments / Results  ?Labs ?(all labs ordered are listed, but only abnormal results are  displayed) ?Labs Reviewed - No data to display ? ?EKG ? ? ?Radiology ?No results found. ? ?Procedures ?Procedures (including critical care time) ? ?Medications Ordered in UC ?Medications - No data to display ? ?Initial Impression / Assessment and Plan / UC Course  ?I have reviewed the triage vital signs and the nursing notes. ? ?Pertinent labs & imaging results that were available during my care of the patient were reviewed by me and considered in my medical decision making (see chart for details). ? ?  ?Patient advised to take Ibuprofen 200mg  4 tablets every 8 hours for pain.  Patient advised to take sudafed bid for the ear congestion for the next several days.  Patient advised if symptoms worsen to follow-up with PCP or return to UC. ? ?Final Clinical Impressions(s) / UC Diagnoses  ? ?Final diagnoses:  ?Acute pain of left ear  ?Swallowing painful  ?Nasal sinus congestion  ? ? ? ?Discharge Instructions   ? ?  ?Patient advised to take Ibuprofen 200mg  4 tablets every 8 hours for pain.  Patient advised to take sudafed bid for the ear congestion for the next several days.  Patient advised if symptoms worsen to follow-up with PCP or return to UC. ? ? ? ? ?ED Prescriptions   ?None ?  ? ?PDMP not reviewed this encounter. ?  ? , PA-C ?07/29/21 1359 ? ?

## 2021-07-29 NOTE — ED Triage Notes (Signed)
Pt present left ear and jaw pain. Symptoms started yesterday. Pt states it hurts to swallow.  ?

## 2021-07-29 NOTE — Discharge Instructions (Addendum)
Patient advised to take Ibuprofen 200mg  4 tablets every 8 hours for pain.  Patient advised to take sudafed bid for the ear congestion for the next several days.  Patient advised if symptoms worsen to follow-up with PCP or return to UC. ?

## 2021-08-02 NOTE — Therapy (Signed)
?OUTPATIENT PHYSICAL THERAPY TREATMENT NOTE ? ? ?Patient Name: Angelica Spencer ?MRN: [id number] ?DOB:[birth year], 31 y.o., female ?Today's Date: 08/03/2021 ? ?PCP: Gavin Potters, FNP ?REFERRING PROVIDER: Wallace Going, DO ? ? PT End of Session - 08/03/21 1215   ? ? Visit Number 5   ? Number of Visits 7   ? Date for PT Re-Evaluation 08/16/21   ? Authorization Type UHC   ? PT Start Time 1210   ? PT Stop Time 1250   ? PT Time Calculation (min) 40 min   ? Activity Tolerance Patient tolerated treatment well   ? Behavior During Therapy [facility] for tasks assessed/performed   ? ?  ?  ? ?  ? ? ? ? ? ?Past Medical History:  ?Diagnosis Date  ? Migraines   ? ?History reviewed. No pertinent surgical history. ?Patient Active Problem List  ? Diagnosis Date Noted  ? Symptomatic mammary hypertrophy 05/20/2021  ? Back pain 05/20/2021  ? Neck pain 05/20/2021  ? Encounter for induction of labor 09/03/2020  ? Postpartum care following vaginal delivery 6/3 09/03/2020  ? SVD (spontaneous vaginal delivery) 09/03/2020  ? RhD negative 09/03/2020  ? Gestational hypertension 09/02/2020  ? ? ?REFERRING PROVIDER: Wallace Going, DO ?  ?REFERRING DIAG: Symptomatic mammary hypertrophy, Chronic bilateral thoracic back pain, Neck pain ? ?THERAPY DIAG:  ?Cervicalgia ? ?Pain in thoracic spine ? ?Muscle weakness (generalized) ? ?Abnormal posture ? ?PERTINENT HISTORY: None ? ?PRECAUTIONS: None ? ?SUBJECTIVE: Patient reports she is doing well with no new issues. Continues to have discomfort with extended periods of sitting. ? ?PAIN:  ?Are you having pain?  ?Yes: NPRS scale: 1/10 ?Pain location: Bilateral neck and upper back region ?Pain description: Sore and tense, dull ache ?Aggravating factors: Running, high intensity exercises, jumping, picking up kids from the ground ?Relieving factors: Massage every month, rolling shoulder back, improving posture, chirp wheels/foam roller ? ?PATIENT GOALS: Pain relief and improve posture ? ? ?OBJECTIVE:   ?PATIENT SURVEYS:  ?NDI 8/50 - 07/26/2021 (8/50 - 06/14/2021 eval) ?  ?POSTURE:  ?Rounded shoulder, forward head posture ?  ?PALPATION: ?Tender to palpation bilateral upper trap, suboccipital regions   ?  ?CERVICAL ROM:  ?  ?Active ROM A/PROM (deg) ?06/14/2021  ?06/21/2021  ?07/26/2021  ?Flexion 60    ?Extension 40    ?Right lateral flexion 40 40 45  ?Left lateral flexion 30 45 45  ?Right rotation 75  75  ?Left rotation 70  75  ?Patient reports right upper trap tightness ?  ?UE ROM: ?                      Thoracic AROM grossly limited in all directions ?  ?UE MMT: ?  ?MMT Right ?06/14/2021 Left ?06/14/2021 Rt / Lt ?07/12/2021 Rt / Lt ?07/26/2021  ?Shoulder flexion 5 5    ?Shoulder extension 5 5    ?Shoulder abduction 5 5    ?Shoulder internal rotation 5 5    ?Shoulder external rotation 5 5    ?Periscapular musculature 4- 4- 4- / 4- 4- / 4-  ?  ?CERVICAL SPECIAL TESTS:  ?DNF endurance: 18 seconds - 07/26/2021 (14 seconds - 06/14/2021 eval) ?  ?  ?TODAY'S TREATMENT:  ?[place] Adult PT Treatment:  DATE: 08/03/2021 ?Therapeutic Exercise: ?UBE L1 x 4 min (2 fwd/bwd) while taking subjective ?Sidelying thoracic rotation x 10 each ?Supine horizontal abduction with blue 2 x 10 - on FR ?Supine serratus punch 10# 2 x 10 - on FR ?Seated double ER and scap retraction with blue x 10 ?Machine low row with 25# 2 x 15 ?Lat pull down machine 25# 2 x 10 ?Machine high row 25# 2 x 10 ?Spring board bar pull down yellow springs 2 x 10 ?Pallof press yellow elastispring 2 x 10 each ? ? ?[place] Adult PT Treatment:                                                DATE: 07/26/2021 ?Therapeutic Exercise: ?UBE L1 x 4 min (2 fwd/bwd) while taking subjective ?Sidelying thoracic rotation x 10 each ?Machine row with 25# 2 x 10 ?Lat pull down machine 25# 2 x 10 ?Supine horizontal abduction with blue 2 x 10 ?Bridge 2 x 10 ?Seated double ER and scap retraction with blue 2 x 10 ?Banded pull down with blue 2 x 10 ? ?[facility] Adult  PT Treatment:                                                DATE: 07/12/2021 ?Therapeutic Exercise: ?UBE L1 x 4 min (2 fwd/bwd) while taking subjective ?Sidelying thoracic rotation x 10 each ?Supine thoracic extension mobs over FR 3 x 5 ?Child's pose thoracic extension stretch with FR x 5 ?Supine chin tuck with small towel roll 2 x 5 with 5 sec ?Supine horizontal abduction with green 2 x 10 ?Seated double ER and scap retraction with green 2 x 10 ?Springboard bar pull down with yellow springs 2 x 10 ?Row with blue 2 x 15 ?Lat pull down with FM 10# 2 x 10 ?Supine posterior pelvic tilts 10 x 5 sec ?  ?PATIENT EDUCATION:  ?Education details: HEP ?Person educated: Patient ?Education method: Explanation, Demonstration, Tactile cues, Verbal cues ?Education comprehension: verbalized understanding, returned demonstration, verbal cues required, tactile cues required, and needs further education ?  ?HOME EXERCISE PROGRAM: ?Access Code: [email or web address] ?  ?  ?ASSESSMENT: ?CLINICAL IMPRESSION: ?Patient tolerated therapy well with no adverse effects. Therapy continues to focus primarily on progressing postural strength and control with good tolerance. She seems to be progressing well with exercises and denies any increase in pain with therapy. Patient does report continued intolerance for sitting extended periods. No changes to HEP this visit. Patient would benefit from continued skilled PT to progress her postural strength and endurance in order to reduce pain and maximize functional ability. ?  ?  ?OBJECTIVE IMPAIRMENTS decreased strength, postural dysfunction, and pain.  ?  ?ACTIVITY LIMITATIONS community activity, meal prep, occupation, and shopping.  ?  ?PERSONAL FACTORS Past/current experiences and Time since onset of injury/illness/exacerbation are also affecting patient's functional outcome.  ?  ?  ?GOALS: ?Goals reviewed with patient? Yes ?  ?SHORT TERM GOALS: Target date: 08/09/2021 ?  ?Patient will be I with initial HEP in  order to progress with therapy. ?Baseline: HEP provided at eval ?07/12/2021: independent with initial HEP ?Goal status: MET ?  ?2.  Patient will be able to demonstrate appropriate posture and will report 50%  improvement in headache frequency ?Baseline: patient exhibits rounded shoulder and forward head posture, and moderate headaches that come frequently ?07/12/2021: patient able to demonstrate appropriate posture and reports improved headaches ?07/26/2021: patient continues to report moderate headaches that come frequently ?Goal status: ONGOING ?  ?LONG TERM GOALS: Target date: 08/16/2021 ?  ?Patient will be I with final HEP to maintain progress from PT. ?Baseline: HEP provided at eval ?07/26/2021: progressing HEP ?Goal status: ONGOING ?  ?2.  Patient will report NDI </= 2/50 in order to indicate improved functional ability ?Baseline: 8/50 NDI ?07/26/2021: 8/50 NDI ?Goal status: ONGOING ?  ?3.  Patient will report no increased pain with sitting at work and will report slight headaches which come infrequently ?Baseline: 1/10 with work related tasks and moderate headaches that come frequently ?07/26/2021: patient continues to report moderate headaches that come frequently and increased pain with sitting at work ?Goal status: ONGOING ?  ?4.  Patient will exhibit periscapular strength grossly >/= 4/5 MMT in order to improve postural control ?Baseline: periscapular strength gross 4-/5 MMT ?07/26/2021: grossly 4-/5 MMT ?Goal status: ONGOING ?  ?  ?PLAN: ?PT FREQUENCY: 1x/week ?  ?PT DURATION: 3 weeks ?  ?PLANNED INTERVENTIONS: Therapeutic exercises, Therapeutic activity, Neuromuscular re-education, Balance training, Gait training, Patient/Family education, Joint manipulation, Joint mobilization, Aquatic Therapy, Dry Needling, Electrical stimulation, Spinal manipulation, Spinal mobilization, Cryotherapy, Moist heat, Taping, and Manual therapy ?  ?PLAN FOR NEXT SESSION: Review HEP and progress PRN, manual/dry needling for upper  trap (right) and cervical region, postural control and endurance ? ? ? ?Hilda Blades, PT, DPT, LAT, ATC ?08/03/21  1:52 PM ?Phone: [phone] ?Fax: [phone] ? ? ?  ? ?

## 2021-08-03 ENCOUNTER — Encounter: Payer: Self-pay | Admitting: Physical Therapy

## 2021-08-03 ENCOUNTER — Other Ambulatory Visit: Payer: Self-pay

## 2021-08-03 ENCOUNTER — Ambulatory Visit: Payer: 59 | Attending: Plastic Surgery | Admitting: Physical Therapy

## 2021-08-03 DIAGNOSIS — R293 Abnormal posture: Secondary | ICD-10-CM | POA: Insufficient documentation

## 2021-08-03 DIAGNOSIS — M542 Cervicalgia: Secondary | ICD-10-CM | POA: Insufficient documentation

## 2021-08-03 DIAGNOSIS — M546 Pain in thoracic spine: Secondary | ICD-10-CM | POA: Diagnosis present

## 2021-08-03 DIAGNOSIS — M6281 Muscle weakness (generalized): Secondary | ICD-10-CM | POA: Diagnosis present

## 2021-08-08 NOTE — Therapy (Incomplete)
?OUTPATIENT PHYSICAL THERAPY TREATMENT NOTE ? ? ?Patient Name: Angelica Spencer ?MRN: [id number] ?DOB:11/09/1990, 31 y.o., female ?Today's Date: 08/08/2021 ? ?PCP: Angelica Potters, FNP ?REFERRING PROVIDER: Gavin Potters, FNP ? ? ? ? ? ? ? ?Past Medical History:  ?Diagnosis Date  ? Migraines   ? ?No past surgical history on file. ?Patient Active Problem List  ? Diagnosis Date Noted  ? Symptomatic mammary hypertrophy 05/20/2021  ? Back pain 05/20/2021  ? Neck pain 05/20/2021  ? Encounter for induction of labor 09/03/2020  ? Postpartum care following vaginal delivery 6/3 09/03/2020  ? SVD (spontaneous vaginal delivery) 09/03/2020  ? RhD negative 09/03/2020  ? Gestational hypertension 09/02/2020  ? ? ?REFERRING PROVIDER: Wallace Going, DO ?  ?REFERRING DIAG: Symptomatic mammary hypertrophy, Chronic bilateral thoracic back pain, Neck pain ? ?THERAPY DIAG:  ?No diagnosis found. ? ?PERTINENT HISTORY: None ? ?PRECAUTIONS: None ? ?SUBJECTIVE: Patient reports she is doing well with no new issues. Continues to have discomfort with extended periods of sitting. ? ?PAIN:  ?Are you having pain?  ?Yes: NPRS scale: 1/10 ?Pain location: Bilateral neck and upper back region ?Pain description: Sore and tense, dull ache ?Aggravating factors: Running, high intensity exercises, jumping, picking up kids from the ground ?Relieving factors: Massage every month, rolling shoulder back, improving posture, chirp wheels/foam roller ? ?PATIENT GOALS: Pain relief and improve posture ? ? ?OBJECTIVE:  ?PATIENT SURVEYS:  ?NDI 8/50 - 07/26/2021 (8/50 - 06/14/2021 eval) ?  ?POSTURE:  ?Rounded shoulder, forward head posture ?  ?PALPATION: ?Tender to palpation bilateral upper trap, suboccipital regions   ?  ?CERVICAL ROM:  ?  ?Active ROM A/PROM (deg) ?06/14/2021  ?06/21/2021  ?07/26/2021  ?Flexion 60    ?Extension 40    ?Right lateral flexion 40 40 45  ?Left lateral flexion 30 45 45  ?Right rotation 75  75  ?Left rotation 70  75  ?Patient reports right upper  trap tightness ?  ?UE ROM: ?                      Thoracic AROM grossly limited in all directions ?  ?UE MMT: ?  ?MMT Right ?06/14/2021 Left ?06/14/2021 Rt / Lt ?07/12/2021 Rt / Lt ?07/26/2021  ?Shoulder flexion 5 5    ?Shoulder extension 5 5    ?Shoulder abduction 5 5    ?Shoulder internal rotation 5 5    ?Shoulder external rotation 5 5    ?Periscapular musculature 4- 4- 4- / 4- 4- / 4-  ?  ?CERVICAL SPECIAL TESTS:  ?DNF endurance: 18 seconds - 07/26/2021 (14 seconds - 06/14/2021 eval) ?  ?  ?TODAY'S TREATMENT:  ?[facility] Adult PT Treatment:                                                DATE: 08/09/2021 ?Therapeutic Exercise: ?UBE L1 x 4 min (2 fwd/bwd) while taking subjective ?Sidelying thoracic rotation x 10 each ?Supine horizontal abduction with blue 2 x 10 - on FR ?Supine serratus punch 10# 2 x 10 - on FR ?Seated double ER and scap retraction with blue x 10 ?Machine low row with 25# 2 x 15 ?Lat pull down machine 25# 2 x 10 ?Machine high row 25# 2 x 10 ?Spring board bar pull down yellow springs 2 x 10 ?Pallof press yellow elastispring 2 x 10 each ? ? ?  [facility] Adult PT Treatment:                                                DATE: 08/03/2021 ?Therapeutic Exercise: ?UBE L1 x 4 min (2 fwd/bwd) while taking subjective ?Sidelying thoracic rotation x 10 each ?Supine horizontal abduction with blue 2 x 10 - on FR ?Supine serratus punch 10# 2 x 10 - on FR ?Seated double ER and scap retraction with blue x 10 ?Machine low row with 25# 2 x 15 ?Lat pull down machine 25# 2 x 10 ?Machine high row 25# 2 x 10 ?Spring board bar pull down yellow springs 2 x 10 ?Pallof press yellow elastispring 2 x 10 each ? ?[facility] Adult PT Treatment:                                                DATE: 07/26/2021 ?Therapeutic Exercise: ?UBE L1 x 4 min (2 fwd/bwd) while taking subjective ?Sidelying thoracic rotation x 10 each ?Machine row with 25# 2 x 10 ?Lat pull down machine 25# 2 x 10 ?Supine horizontal abduction with blue 2 x 10 ?Bridge 2 x 10 ?Seated double ER  and scap retraction with blue 2 x 10 ?Banded pull down with blue 2 x 10 ?  ?PATIENT EDUCATION:  ?Education details: HEP ?Person educated: Patient ?Education method: Explanation, Demonstration, Tactile cues, Verbal cues ?Education comprehension: verbalized understanding, returned demonstration, verbal cues required, tactile cues required, and needs further education ?  ?HOME EXERCISE PROGRAM: ?Access Code: [email or web address] ?  ?  ?ASSESSMENT: ?CLINICAL IMPRESSION: ?Patient tolerated therapy well with no adverse effects. *** Patient would benefit from continued skilled PT to progress her postural strength and endurance in order to reduce pain and maximize functional ability. ? ?Therapy continues to focus primarily on progressing postural strength and control with good tolerance. She seems to be progressing well with exercises and denies any increase in pain with therapy. Patient does report continued intolerance for sitting extended periods. No changes to HEP this visit.  ?  ?  ?OBJECTIVE IMPAIRMENTS decreased strength, postural dysfunction, and pain.  ?  ?ACTIVITY LIMITATIONS community activity, meal prep, occupation, and shopping.  ?  ?PERSONAL FACTORS Past/current experiences and Time since onset of injury/illness/exacerbation are also affecting patient's functional outcome.  ?  ?  ?GOALS: ?Goals reviewed with patient? Yes ?  ?SHORT TERM GOALS: Target date: 08/09/2021 ?  ?Patient will be I with initial HEP in order to progress with therapy. ?Baseline: HEP provided at eval ?07/12/2021: independent with initial HEP ?Goal status: MET ?  ?2.  Patient will be able to demonstrate appropriate posture and will report 50% improvement in headache frequency ?Baseline: patient exhibits rounded shoulder and forward head posture, and moderate headaches that come frequently ?07/12/2021: patient able to demonstrate appropriate posture and reports improved headaches ?07/26/2021: patient continues to report moderate headaches that come  frequently ?Goal status: ONGOING ?  ?LONG TERM GOALS: Target date: 08/16/2021 ?  ?Patient will be I with final HEP to maintain progress from PT. ?Baseline: HEP provided at eval ?07/26/2021: progressing HEP ?Goal status: ONGOING ?  ?2.  Patient will report NDI </= 2/50 in order to indicate improved functional ability ?Baseline: 8/50 NDI ?07/26/2021: 8/50 NDI ?Goal status: ONGOING ?  ?  3.  Patient will report no increased pain with sitting at work and will report slight headaches which come infrequently ?Baseline: 1/10 with work related tasks and moderate headaches that come frequently ?07/26/2021: patient continues to report moderate headaches that come frequently and increased pain with sitting at work ?Goal status: ONGOING ?  ?4.  Patient will exhibit periscapular strength grossly >/= 4/5 MMT in order to improve postural control ?Baseline: periscapular strength gross 4-/5 MMT ?07/26/2021: grossly 4-/5 MMT ?Goal status: ONGOING ?  ?  ?PLAN: ?PT FREQUENCY: 1x/week ?  ?PT DURATION: 3 weeks ?  ?PLANNED INTERVENTIONS: Therapeutic exercises, Therapeutic activity, Neuromuscular re-education, Balance training, Gait training, Patient/Family education, Joint manipulation, Joint mobilization, Aquatic Therapy, Dry Needling, Electrical stimulation, Spinal manipulation, Spinal mobilization, Cryotherapy, Moist heat, Taping, and Manual therapy ?  ?PLAN FOR NEXT SESSION: Review HEP and progress PRN, manual/dry needling for upper trap (right) and cervical region, postural control and endurance ? ? ? ?Hilda Blades, PT, DPT, LAT, ATC ?08/08/21  3:37 PM ?Phone: [phone] ?Fax: [phone] ? ? ?  ? ?

## 2021-08-09 ENCOUNTER — Ambulatory Visit: Payer: 59 | Admitting: Physical Therapy

## 2021-08-09 NOTE — Therapy (Signed)
?OUTPATIENT PHYSICAL THERAPY TREATMENT NOTE ? ?DISCHARGE ? ? ?Patient Name: Angelica Spencer ?MRN: [id number] ?DOB:10/31/1990, 31 y.o., female ?Today's Date: 08/12/2021 ? ?PCP: Gavin Potters, FNP ?REFERRING PROVIDER: Gavin Potters, FNP ? ? PT End of Session - 08/12/21 7026   ? ? Visit Number 6   ? Number of Visits 7   ? Date for PT Re-Evaluation 08/16/21   ? Authorization Type [employer]   ? PT Start Time [phone]   ? PT Stop Time 8850   ? PT Time Calculation (min) 38 min   ? Activity Tolerance Patient tolerated treatment well   ? Behavior During Therapy [facility] for tasks assessed/performed   ? ?  ?  ? ?  ? ? ? ? ? ? ?Past Medical History:  ?Diagnosis Date  ? Migraines   ? ?History reviewed. No pertinent surgical history. ?Patient Active Problem List  ? Diagnosis Date Noted  ? Symptomatic mammary hypertrophy 05/20/2021  ? Back pain 05/20/2021  ? Neck pain 05/20/2021  ? Encounter for induction of labor 09/03/2020  ? Postpartum care following vaginal delivery 6/3 09/03/2020  ? SVD (spontaneous vaginal delivery) 09/03/2020  ? RhD negative 09/03/2020  ? Gestational hypertension 09/02/2020  ? ? ?REFERRING PROVIDER: Wallace Going, DO ?  ?REFERRING DIAG: Symptomatic mammary hypertrophy, Chronic bilateral thoracic back pain, Neck pain ? ?THERAPY DIAG:  ?Cervicalgia ? ?Pain in thoracic spine ? ?Muscle weakness (generalized) ? ?Abnormal posture ? ?PERTINENT HISTORY: None ? ?PRECAUTIONS: None ? ?SUBJECTIVE: Patient reports she is doing well. She notes improvement with therapy but does continue to have increased pain and headaches with extended periods of sitting and activity. ? ?PAIN:  ?Are you having pain?  ?Yes: NPRS scale: 1/10 ?Pain location: Bilateral neck and upper back region ?Pain description: Sore and tense, dull ache ?Aggravating factors: Running, high intensity exercises, jumping, picking up kids from the ground ?Relieving factors: Massage every month, rolling shoulder back, improving posture, chirp wheels/foam  roller ? ?PATIENT GOALS: Pain relief and improve posture ? ? ?OBJECTIVE:  ?PATIENT SURVEYS:  ?[place] 7/50 - 08/12/2021 (8/50 - 07/26/2021, 8/50 - 06/14/2021 eval) ?  ?POSTURE:  ?Rounded shoulder, forward head posture ?  ?PALPATION: ?Tender to palpation bilateral upper trap, suboccipital regions   ?  ?CERVICAL ROM:  ?  ?Active ROM A/PROM (deg) ?06/14/2021  ?06/21/2021  ?07/26/2021  ?Flexion 60    ?Extension 40    ?Right lateral flexion 40 40 45  ?Left lateral flexion 30 45 45  ?Right rotation 75  75  ?Left rotation 70  75  ?Patient reports right upper trap tightness ?  ?UE ROM: ?                      Thoracic AROM grossly limited in all directions ?  ?UE MMT: ?  ?MMT Right ?06/14/2021 Left ?06/14/2021 Rt / Lt ?07/12/2021 Rt / Lt ?07/26/2021 Rt / Lt ?08/12/2021  ?Shoulder flexion 5 5     ?Shoulder extension 5 5     ?Shoulder abduction 5 5     ?Shoulder internal rotation 5 5     ?Shoulder external rotation 5 5     ?Periscapular musculature 4- 4- 4- / 4- 4- / 4- 4 / 4  ?  ?CERVICAL SPECIAL TESTS:  ?DNF endurance: 25 seconds - 08/12/2021 (18 seconds - 07/26/2021, 14 seconds - 06/14/2021 eval) ?  ?  ?TODAY'S TREATMENT:  ?[facility] Adult PT Treatment:  DATE: 08/12/2021 ?Therapeutic Exercise: ?UBE L1 x 4 min (2 fwd/bwd) while taking subjective ?Sidelying thoracic rotation x 10 each ?Supine thoracic extension over FR x 5 ?Supine horizontal abduction with blue 2 x 15 ?Bridge 2 x 10 ?Machine low row with 35# 2 x 10 ?Lat pull down machine 35# 2 x 10 ?Machine high row 35# 2 x 10 ?Seated double ER and scap retraction with blue x 15 ?Spring board bar pull down yellow springs x 10 ?Pallof press red power band 2 x 10 each ? ? ?[facility] Adult PT Treatment:                                                DATE: 08/03/2021 ?Therapeutic Exercise: ?UBE L1 x 4 min (2 fwd/bwd) while taking subjective ?Sidelying thoracic rotation x 10 each ?Supine horizontal abduction with blue 2 x 10 - on FR ?Supine serratus punch 10# 2 x 10  - on FR ?Seated double ER and scap retraction with blue x 10 ?Machine low row with 25# 2 x 15 ?Lat pull down machine 25# 2 x 10 ?Machine high row 25# 2 x 10 ?Spring board bar pull down yellow springs 2 x 10 ?Pallof press yellow elastispring 2 x 10 each ? ?[facility] Adult PT Treatment:                                                DATE: 07/26/2021 ?Therapeutic Exercise: ?UBE L1 x 4 min (2 fwd/bwd) while taking subjective ?Sidelying thoracic rotation x 10 each ?Machine row with 25# 2 x 10 ?Lat pull down machine 25# 2 x 10 ?Supine horizontal abduction with blue 2 x 10 ?Bridge 2 x 10 ?Seated double ER and scap retraction with blue 2 x 10 ?Banded pull down with blue 2 x 10 ?  ?PATIENT EDUCATION:  ?Education details: POC discharge, HEP ?Person educated: Patient ?Education method: Explanation ?Education comprehension: verbalized understanding ?  ?HOME EXERCISE PROGRAM: ?Access Code: [email or web address] ?  ?  ?ASSESSMENT: ?CLINICAL IMPRESSION: ?Patient tolerated therapy well with no adverse effects. She had progressed well in therapy demonstrating improved strength and mobility, but does continue to report limitations with sitting and activity tolerance and headaches that cause in increase in disability level on NDI. She is independent with her HEP so at this time patient will be discharged from PT and will follow-up with her referring provider. ?  ?  ?OBJECTIVE IMPAIRMENTS decreased strength, postural dysfunction, and pain.  ?  ?ACTIVITY LIMITATIONS community activity, meal prep, occupation, and shopping.  ?  ?PERSONAL FACTORS Past/current experiences and Time since onset of injury/illness/exacerbation are also affecting patient's functional outcome.  ?  ?  ?GOALS: ?Goals reviewed with patient? Yes ?  ?SHORT TERM GOALS: Target date: 08/09/2021 ?  ?Patient will be I with initial HEP in order to progress with therapy. ?Baseline: HEP provided at eval ?07/12/2021: independent with initial HEP ?Goal status: MET ?  ?2.  Patient will be able to  demonstrate appropriate posture and will report 50% improvement in headache frequency ?Baseline: patient exhibits rounded shoulder and forward head posture, and moderate headaches that come frequently ?07/12/2021: patient able to demonstrate appropriate posture and reports improved headaches ?07/26/2021: patient continues to report moderate headaches that come frequently ?  08/12/2021: patient reports improvement in headaches but continues to occur frequently ?Goal status: PARTIALLY MET ?  ?LONG TERM GOALS: Target date: 08/16/2021 ?  ?Patient will be I with final HEP to maintain progress from PT. ?Baseline: HEP provided at eval ?07/26/2021: progressing HEP ?08/12/2021: independent ?Goal status: MET ?  ?2.  Patient will report NDI </= 2/50 in order to indicate improved functional ability ?Baseline: 8/50 NDI ?07/26/2021: 8/50 NDI ?08/12/2021: 7/50 NDI ?Goal status: PARTIALLY MET ?  ?3.  Patient will report no increased pain with sitting at work and will report slight headaches which come infrequently ?Baseline: 1/10 with work related tasks and moderate headaches that come frequently ?07/26/2021: patient continues to report moderate headaches that come frequently and increased pain with sitting at work ?08/12/2021: patient reports improvement in headaches but continues to occur frequently ?Goal status: PARTIALLY MET ?  ?4.  Patient will exhibit periscapular strength grossly >/= 4/5 MMT in order to improve postural control ?Baseline: periscapular strength gross 4-/5 MMT ?07/26/2021: grossly 4-/5 MMT ?08/12/2021: grossly 4/5 MMT ?Goal status: MET ?  ?  ?PLAN: ?PT FREQUENCY: 1x/week ?  ?PT DURATION: 3 weeks ?  ?PLANNED INTERVENTIONS: Therapeutic exercises, Therapeutic activity, Neuromuscular re-education, Balance training, Gait training, Patient/Family education, Joint manipulation, Joint mobilization, Aquatic Therapy, Dry Needling, Electrical stimulation, Spinal manipulation, Spinal mobilization, Cryotherapy, Moist heat, Taping, and  Manual therapy ?  ?PLAN FOR NEXT SESSION: NA - discharge ? ? ? ?Hilda Blades, PT, DPT, LAT, ATC ?08/12/21  9:58 AM ?Phone: [phone] ?Fax: [phone] ? ? ? ?PHYSICAL THERAPY DISCHARGE SUMMARY ? ?Visits from [place]

## 2021-08-12 ENCOUNTER — Ambulatory Visit: Payer: 59 | Admitting: Physical Therapy

## 2021-08-12 ENCOUNTER — Encounter: Payer: Self-pay | Admitting: Physical Therapy

## 2021-08-12 ENCOUNTER — Other Ambulatory Visit: Payer: Self-pay

## 2021-08-12 DIAGNOSIS — M542 Cervicalgia: Secondary | ICD-10-CM | POA: Diagnosis not present

## 2021-08-12 DIAGNOSIS — M6281 Muscle weakness (generalized): Secondary | ICD-10-CM

## 2021-08-12 DIAGNOSIS — M546 Pain in thoracic spine: Secondary | ICD-10-CM

## 2021-08-12 DIAGNOSIS — R293 Abnormal posture: Secondary | ICD-10-CM

## 2021-08-15 ENCOUNTER — Ambulatory Visit: Payer: 59 | Admitting: Surgical

## 2021-08-15 ENCOUNTER — Encounter: Payer: Self-pay | Admitting: Physician Assistant

## 2021-08-15 DIAGNOSIS — N62 Hypertrophy of breast: Secondary | ICD-10-CM

## 2021-08-15 DIAGNOSIS — G8929 Other chronic pain: Secondary | ICD-10-CM

## 2021-08-15 DIAGNOSIS — M546 Pain in thoracic spine: Secondary | ICD-10-CM | POA: Diagnosis not present

## 2021-08-15 DIAGNOSIS — M542 Cervicalgia: Secondary | ICD-10-CM

## 2021-08-15 NOTE — Progress Notes (Signed)
? ?  Referring Provider ?Dorisann Frames, FNP ?No address on file  ? ?CC:  ?Chief Complaint  ?Patient presents with  ? Follow-up  ?   ? ?Angelica Spencer is an 31 y.o. female.  ?HPI: Patient is a 31 y.o. year old female here for follow up after completing physical therapy for pain related to macromastia.  ? ?She was seen for initial consult by Dr. Ulice Bold.  At that time, she complained of chronic upper back and neck discomfort in the context of her large breasts.  STN 32 cm on the right, 30 cm on the left.  Preoperative bra size equals G cup.  BMI equals 31.9 kg/m?Marland Kitchen  Estimated excess breast tissue to be removed at time of surgery equals 450 to 485 g each side.  Patient would like to be between a C and D cup.  ? ?Today, she presents with her spouse.  She reports she completed physical therapy last Friday which was 08/12/2021.  She reports it temporarily helped, but she is still having neck, upper back, shoulder pain.  She is still interested in pursuing surgical intervention.  She has some questions about the procedure. ? ?She does report that approximately 8 to 10 years ago she did apply prescribed antifungal medications to the inframammary fold of bilateral breast for rashes, but reports she has not had this for a while and has been using lotion to help keep the area from developing rashes. ? ?Review of Systems ?MSK: Endorses ongoing back and neck discomfort ?Skin: Endorses rashes ? ?Physical Exam ? ?  07/29/2021  ?  1:38 PM 05/20/2021  ? 10:49 AM 05/19/2021  ?  9:40 AM  ?Vitals with BMI  ?Height  5\' 3"    ?Weight  180 lbs   ?BMI  31.89   ?Systolic 131 136  ?Diastolic 94 96 93  ?Pulse 83 90 97  ?  ?General:  No acute distress,  Alert and oriented, Non-Toxic, Normal speech and affect ?Psych: Normal behavior and mood ?Respiratory: No increased WOB ?MSK: Ambulatory ? ?Assessment/Plan ? ?Patient is interested in pursuing surgical intervention for bilateral breast reduction. Patient has completed at least 6 weeks of  physical therapy for pain related to macromastia.   ? ?Discussed with patient we would submit to insurance for authorization, discussed approval could take up to 6 weeks.  She reports ideal surgical date would be around October as they have some trips planned. ? ?Will route to surgical scheduling and clinical coordination team. ? ? ? ?November ?08/15/2021, 1:59 PM  ? ? ? ? ? ? ?

## 2021-09-08 ENCOUNTER — Telehealth: Payer: Self-pay | Admitting: Plastic Surgery

## 2021-09-08 NOTE — Telephone Encounter (Signed)
LVM to discuss surgery

## 2021-10-31 DIAGNOSIS — G5602 Carpal tunnel syndrome, left upper limb: Secondary | ICD-10-CM | POA: Insufficient documentation

## 2021-12-06 ENCOUNTER — Ambulatory Visit (INDEPENDENT_AMBULATORY_CARE_PROVIDER_SITE_OTHER): Payer: 59 | Admitting: Physician Assistant

## 2021-12-06 DIAGNOSIS — N62 Hypertrophy of breast: Secondary | ICD-10-CM

## 2021-12-06 MED ORDER — OXYCODONE-ACETAMINOPHEN 5-325 MG PO TABS: 1.0000 | ORAL_TABLET | Freq: Three times a day (TID) | ORAL | 0 refills | Status: DC | PRN

## 2021-12-06 MED ORDER — CEPHALEXIN 500 MG PO CAPS: 500.0000 mg | ORAL_CAPSULE | Freq: Three times a day (TID) | ORAL | 0 refills | Status: AC

## 2021-12-06 NOTE — Progress Notes (Signed)
Patient ID: Angelica Spencer, female    DOB: 25-Dec-1990, 31 y.o.   MRN: [id number]  Chief Complaint  Patient presents with   Pre-op Exam    No diagnosis found.   History of Present Illness: Angelica Spencer is a 31 y.o.  female  with a history of macromastia. She presents for preoperative evaluation for upcoming procedure, bilateral breast reduction, scheduled for 12/21/2021 with Dr. Ulice Bold.  The patient has not had problems with anesthesia.   Summary of Previous Visit: The patient was most recently seen in the office on 08/15/2021.  At that time she complained of chronic upper back and neck discomfort in the context of a large breasts.  STN 32 cm on the right, 30 cm on the left.  Preoperative bra size equals G.  BMI equals 31.9.  Excess breast tissue to be removed at time of surgery equals 452 485 g on each side.  The patient would like to be a C and D.  She had completed physical therapy which temporarily helped but was still having neck pain, back pain and shoulder pain.  She did need some time in between surgery due to other commitments.  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   Past Medical History: Allergies: Allergies  Allergen Reactions   Imitrex [Sumatriptan]     Pt reports heart palpitations     Current Medications:  Current Outpatient Medications:    acetaminophen (TYLENOL) 500 MG tablet, Take 2 tablets (1,000 mg total) by mouth every 6 (six) hours as needed. (Patient taking differently: Take 1,000 mg by mouth every 6 (six) hours as needed for mild pain.), Disp: 30 tablet, Rfl: 0   Drospirenone (SLYND) 4 MG TABS, Take 1 tablet by mouth daily., Disp: , Rfl:    famotidine (PEPCID) 20 MG tablet, Take 20 mg by mouth 2 (two) times daily as needed for  heartburn., Disp: , Rfl:    ibuprofen (ADVIL) 600 MG tablet, Take 1 tablet (600 mg total) by mouth every 6 (six) hours as needed. (Patient taking differently: Take 600 mg by mouth every 6 (six) hours as needed for mild pain.), Disp: 30 tablet, Rfl: 0  Past Medical Problems: Past Medical History:  Diagnosis Date   Migraines     Past Surgical History: No past surgical history on file.  Social History: Social History   Socioeconomic History   Marital status: Married    Spouse name: Not on file   Number of children: Not on file   Years of education: Not on file   Highest education level: Not on file  Occupational History   Not on file  Tobacco Use   Smoking status: Never   Smokeless tobacco: Never  Vaping Use   Vaping Use: Never used  Substance and Sexual Activity   Alcohol use: Never   Drug use: Never   Sexual activity: Yes  Other Topics Concern   Not on file  Social History Narrative   Not on file   Social Determinants of Health   Financial Resource Strain: Not on file  Food Insecurity: Not on file  Transportation Needs: Not on file  Physical Activity: Not on file  Stress: Not on file  Social Connections: Not on file  Intimate Partner Violence: Not on file  Family History: No family history on file.  Review of Systems: ROS  Physical Exam: Vital Signs There were no vitals taken for this visit.  Physical Exam  Constitutional:      General: Not in acute distress.    Appearance: Normal appearance. Not ill-appearing.  HENT:     Head: Normocephalic and atraumatic.  Eyes:     Pupils: Pupils are equal, round. Cardiovascular:     Rate and Rhythm: Normal rate. Pulmonary:     Effort: No respiratory distress or increased work of breathing.  Speaks in full sentences. Musculoskeletal: Normal range of motion. No lower extremity swelling or edema. No varicosities.  Skin:    General: Skin is warm and dry.     Findings: No erythema or rash.  Neurological:      Mental Status: Alert and oriented to person, place, and time.  Psychiatric:        Mood and Affect: Mood normal.        Behavior: Behavior normal.    Assessment/Plan: The patient is scheduled for bilateral breast reduction with Dr. Ulice Bold.  Risks, benefits, and alternatives of procedure discussed, questions answered and consent obtained.    Smoking Status: Non-smoker Last Mammogram: No previous mammogram secondary to age  Caprini Score: 4; Risk Factors include: BMI, length of surgery, oral contraceptives; Recommendation is early ambulation  Pictures obtained: Previous visit  Post-op Rx sent to pharmacy: Keflex, Percocet  Patient was provided with the  General Surgical Risk consent document and Pain Medication Agreement prior to their appointment.  They had adequate time to read through the risk consent documents and Pain Medication Agreement. We also discussed them in person together during this preop appointment. All of their questions were answered to their satisfaction.  Recommended calling if they have any further questions.  Risk consent form and Pain Medication Agreement to be scanned into patient's chart.     Electronically signed by: Kelle Darting Renne Platts, PA-C 12/06/2021 3:32 PM

## 2021-12-06 NOTE — H&P (View-Only) (Signed)
Patient ID: Angelica Spencer, female    DOB: 25-Dec-1990, 31 y.o.   MRN: [id number]  Chief Complaint  Patient presents with   Pre-op Exam    No diagnosis found.   History of Present Illness: Angelica Spencer is a 31 y.o.  female  with a history of macromastia. She presents for preoperative evaluation for upcoming procedure, bilateral breast reduction, scheduled for 12/21/2021 with Dr. Ulice Bold.  The patient has not had problems with anesthesia.   Summary of Previous Visit: The patient was most recently seen in the office on 08/15/2021.  At that time she complained of chronic upper back and neck discomfort in the context of a large breasts.  STN 32 cm on the right, 30 cm on the left.  Preoperative bra size equals G.  BMI equals 31.9.  Excess breast tissue to be removed at time of surgery equals 452 485 g on each side.  The patient would like to be a C and D.  She had completed physical therapy which temporarily helped but was still having neck pain, back pain and shoulder pain.  She did need some time in between surgery due to other commitments.  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   Past Medical History: Allergies: Allergies  Allergen Reactions   Imitrex [Sumatriptan]     Pt reports heart palpitations     Current Medications:  Current Outpatient Medications:    acetaminophen (TYLENOL) 500 MG tablet, Take 2 tablets (1,000 mg total) by mouth every 6 (six) hours as needed. (Patient taking differently: Take 1,000 mg by mouth every 6 (six) hours as needed for mild pain.), Disp: 30 tablet, Rfl: 0   Drospirenone (SLYND) 4 MG TABS, Take 1 tablet by mouth daily., Disp: , Rfl:    famotidine (PEPCID) 20 MG tablet, Take 20 mg by mouth 2 (two) times daily as needed for  heartburn., Disp: , Rfl:    ibuprofen (ADVIL) 600 MG tablet, Take 1 tablet (600 mg total) by mouth every 6 (six) hours as needed. (Patient taking differently: Take 600 mg by mouth every 6 (six) hours as needed for mild pain.), Disp: 30 tablet, Rfl: 0  Past Medical Problems: Past Medical History:  Diagnosis Date   Migraines     Past Surgical History: No past surgical history on file.  Social History: Social History   Socioeconomic History   Marital status: Married    Spouse name: Not on file   Number of children: Not on file   Years of education: Not on file   Highest education level: Not on file  Occupational History   Not on file  Tobacco Use   Smoking status: Never   Smokeless tobacco: Never  Vaping Use   Vaping Use: Never used  Substance and Sexual Activity   Alcohol use: Never   Drug use: Never   Sexual activity: Yes  Other Topics Concern   Not on file  Social History Narrative   Not on file   Social Determinants of Health   Financial Resource Strain: Not on file  Food Insecurity: Not on file  Transportation Needs: Not on file  Physical Activity: Not on file  Stress: Not on file  Social Connections: Not on file  Intimate Partner Violence: Not on file  Family History: No family history on file.  Review of Systems: ROS  Physical Exam: Vital Signs There were no vitals taken for this visit.  Physical Exam  Constitutional:      General: Not in acute distress.    Appearance: Normal appearance. Not ill-appearing.  HENT:     Head: Normocephalic and atraumatic.  Eyes:     Pupils: Pupils are equal, round. Cardiovascular:     Rate and Rhythm: Normal rate. Pulmonary:     Effort: No respiratory distress or increased work of breathing.  Speaks in full sentences. Musculoskeletal: Normal range of motion. No lower extremity swelling or edema. No varicosities.  Skin:    General: Skin is warm and dry.     Findings: No erythema or rash.  Neurological:      Mental Status: Alert and oriented to person, place, and time.  Psychiatric:        Mood and Affect: Mood normal.        Behavior: Behavior normal.    Assessment/Plan: The patient is scheduled for bilateral breast reduction with Dr. Dillingham.  Risks, benefits, and alternatives of procedure discussed, questions answered and consent obtained.    Smoking Status: Non-smoker Last Mammogram: No previous mammogram secondary to age  Caprini Score: 4; Risk Factors include: BMI, length of surgery, oral contraceptives; Recommendation is early ambulation  Pictures obtained: Previous visit  Post-op Rx sent to pharmacy: Keflex, Percocet  Patient was provided with the  General Surgical Risk consent document and Pain Medication Agreement prior to their appointment.  They had adequate time to read through the risk consent documents and Pain Medication Agreement. We also discussed them in person together during this preop appointment. All of their questions were answered to their satisfaction.  Recommended calling if they have any further questions.  Risk consent form and Pain Medication Agreement to be scanned into patient's chart.     Electronically signed by: Sorina Derrig Todd Lluvia Gwynne, PA-C 12/06/2021 3:32 PM  

## 2021-12-13 ENCOUNTER — Encounter (HOSPITAL_BASED_OUTPATIENT_CLINIC_OR_DEPARTMENT_OTHER): Payer: Self-pay | Admitting: Plastic Surgery

## 2021-12-13 ENCOUNTER — Other Ambulatory Visit: Payer: Self-pay

## 2021-12-20 ENCOUNTER — Encounter (HOSPITAL_BASED_OUTPATIENT_CLINIC_OR_DEPARTMENT_OTHER): Payer: Self-pay | Admitting: Plastic Surgery

## 2021-12-20 NOTE — Anesthesia Preprocedure Evaluation (Signed)
Anesthesia Evaluation  Patient identified by MRN, date of birth, ID band Patient awake    Reviewed: Allergy & Precautions, NPO status , Patient's Chart, lab work & pertinent test results  Airway Mallampati: III  TM Distance: >3 FB Neck ROM: Full    Dental no notable dental hx. (+) Teeth Intact, Dental Advisory Given   Pulmonary neg pulmonary ROS,    Pulmonary exam normal breath sounds clear to auscultation       Cardiovascular hypertension, Normal cardiovascular exam Rhythm:Regular Rate:Normal  Hx/o gestational HTN on no Rx currently   Neuro/Psych  Headaches, Anxiety    GI/Hepatic Neg liver ROS, GERD  Medicated,  Endo/Other  Hypothyroidism Obesity Mammary hypertrophy  Renal/GU negative Renal ROS  negative genitourinary   Musculoskeletal Back pain Neck pain   Abdominal (+) + obese,   Peds  Hematology negative hematology ROS (+)   Anesthesia Other Findings   Reproductive/Obstetrics negative OB ROS                           Anesthesia Physical Anesthesia Plan  ASA: 2  Anesthesia Plan: General   Post-op Pain Management: Minimal or no pain anticipated, Ketamine IV*, Dilaudid IV, Tylenol PO (pre-op)* and Precedex   Induction: Intravenous  PONV Risk Score and Plan: 4 or greater and Treatment may vary due to age or medical condition, Dexamethasone and Ondansetron  Airway Management Planned: Oral ETT  Additional Equipment: None  Intra-op Plan:   Post-operative Plan: Extubation in OR  Informed Consent:   Plan Discussed with:   Anesthesia Plan Comments:         Anesthesia Quick Evaluation

## 2021-12-21 ENCOUNTER — Encounter (HOSPITAL_BASED_OUTPATIENT_CLINIC_OR_DEPARTMENT_OTHER): Payer: Self-pay | Admitting: Plastic Surgery

## 2021-12-21 ENCOUNTER — Ambulatory Visit (HOSPITAL_BASED_OUTPATIENT_CLINIC_OR_DEPARTMENT_OTHER): Payer: 59 | Admitting: Certified Registered"

## 2021-12-21 ENCOUNTER — Other Ambulatory Visit: Payer: Self-pay

## 2021-12-21 ENCOUNTER — Encounter (HOSPITAL_BASED_OUTPATIENT_CLINIC_OR_DEPARTMENT_OTHER)
Admission: RE | Disposition: A | Discharge status: Home / Self Care | Payer: Self-pay | Source: Home / Self Care | Attending: Plastic Surgery

## 2021-12-21 ENCOUNTER — Ambulatory Visit (HOSPITAL_BASED_OUTPATIENT_CLINIC_OR_DEPARTMENT_OTHER)
Admission: RE | Admit: 2021-12-21 | Discharge: 2021-12-21 | Disposition: A | Discharge status: Home / Self Care | Payer: 59 | Attending: Plastic Surgery | Admitting: Plastic Surgery

## 2021-12-21 DIAGNOSIS — M549 Dorsalgia, unspecified: Secondary | ICD-10-CM | POA: Insufficient documentation

## 2021-12-21 DIAGNOSIS — Z6838 Body mass index (BMI) 38.0-38.9, adult: Secondary | ICD-10-CM | POA: Insufficient documentation

## 2021-12-21 DIAGNOSIS — N6011 Diffuse cystic mastopathy of right breast: Secondary | ICD-10-CM | POA: Insufficient documentation

## 2021-12-21 DIAGNOSIS — Z01818 Encounter for other preprocedural examination: Secondary | ICD-10-CM

## 2021-12-21 DIAGNOSIS — N62 Hypertrophy of breast: Secondary | ICD-10-CM | POA: Insufficient documentation

## 2021-12-21 DIAGNOSIS — K219 Gastro-esophageal reflux disease without esophagitis: Secondary | ICD-10-CM | POA: Diagnosis not present

## 2021-12-21 DIAGNOSIS — M542 Cervicalgia: Secondary | ICD-10-CM | POA: Insufficient documentation

## 2021-12-21 DIAGNOSIS — I10 Essential (primary) hypertension: Secondary | ICD-10-CM | POA: Diagnosis not present

## 2021-12-21 DIAGNOSIS — N6012 Diffuse cystic mastopathy of left breast: Secondary | ICD-10-CM | POA: Diagnosis not present

## 2021-12-21 DIAGNOSIS — E669 Obesity, unspecified: Secondary | ICD-10-CM | POA: Diagnosis not present

## 2021-12-21 DIAGNOSIS — E039 Hypothyroidism, unspecified: Secondary | ICD-10-CM | POA: Insufficient documentation

## 2021-12-21 DIAGNOSIS — Z8759 Personal history of other complications of pregnancy, childbirth and the puerperium: Secondary | ICD-10-CM | POA: Diagnosis not present

## 2021-12-21 HISTORY — DX: Personal history of other diseases of the female genital tract: Z87.42

## 2021-12-21 HISTORY — DX: Anxiety disorder, unspecified: F41.9

## 2021-12-21 HISTORY — DX: Acute tonsillitis, unspecified: J03.90

## 2021-12-21 HISTORY — PX: BREAST REDUCTION SURGERY: SHX8

## 2021-12-21 HISTORY — DX: Hypothyroidism, unspecified: E03.9

## 2021-12-21 LAB — POCT PREGNANCY, URINE: Preg Test, Ur: NEGATIVE

## 2021-12-21 SURGERY — MAMMOPLASTY, REDUCTION
Anesthesia: General | Site: Breast | Laterality: Bilateral

## 2021-12-21 MED ORDER — OXYCODONE HCL 5 MG/5ML PO SOLN: 5.0000 mg | Freq: Once | ORAL | Status: AC | PRN

## 2021-12-21 MED ORDER — 0.9 % SODIUM CHLORIDE (POUR BTL) OPTIME
TOPICAL | Status: DC | PRN
  Administered 2021-12-21: 200 mL

## 2021-12-21 MED ORDER — DEXMEDETOMIDINE HCL IN NACL 80 MCG/20ML IV SOLN
INTRAVENOUS | Status: DC | PRN
  Administered 2021-12-21: 20 ug via BUCCAL
  Administered 2021-12-21: 12 ug via BUCCAL

## 2021-12-21 MED ORDER — DIPHENHYDRAMINE HCL 50 MG/ML IJ SOLN
INTRAMUSCULAR | Status: AC
  Filled 2021-12-21: qty 1

## 2021-12-21 MED ORDER — CHLORHEXIDINE GLUCONATE 4 % EX LIQD: 1.0000 | Freq: Once | CUTANEOUS | Status: DC

## 2021-12-21 MED ORDER — CEFAZOLIN SODIUM-DEXTROSE 2-4 GM/100ML-% IV SOLN
INTRAVENOUS | Status: AC
  Filled 2021-12-21: qty 100

## 2021-12-21 MED ORDER — SODIUM CHLORIDE 0.9% FLUSH: 3.0000 mL | INTRAVENOUS | Status: DC | PRN

## 2021-12-21 MED ORDER — SODIUM CHLORIDE 0.9% FLUSH: 3.0000 mL | Freq: Two times a day (BID) | INTRAVENOUS | Status: DC

## 2021-12-21 MED ORDER — PROPOFOL 10 MG/ML IV BOLUS
INTRAVENOUS | Status: DC | PRN
  Administered 2021-12-21: 200 mg via INTRAVENOUS

## 2021-12-21 MED ORDER — EPINEPHRINE PF 1 MG/ML IJ SOLN
INTRAMUSCULAR | Status: AC
  Filled 2021-12-21: qty 1

## 2021-12-21 MED ORDER — FENTANYL CITRATE (PF) 100 MCG/2ML IJ SOLN: 25.0000 ug | INTRAMUSCULAR | Status: DC | PRN

## 2021-12-21 MED ORDER — LIDOCAINE HCL (PF) 1 % IJ SOLN
INTRAMUSCULAR | Status: AC
  Filled 2021-12-21: qty 30

## 2021-12-21 MED ORDER — MIDAZOLAM HCL 2 MG/2ML IJ SOLN
INTRAMUSCULAR | Status: AC
  Filled 2021-12-21: qty 2

## 2021-12-21 MED ORDER — FENTANYL CITRATE (PF) 100 MCG/2ML IJ SOLN
INTRAMUSCULAR | Status: DC | PRN
  Administered 2021-12-21 (×3): 50 ug via INTRAVENOUS
  Administered 2021-12-21: 100 ug via INTRAVENOUS

## 2021-12-21 MED ORDER — LIDOCAINE HCL 1 % IJ SOLN
INTRAVENOUS | Status: DC | PRN
  Administered 2021-12-21: 300 mL

## 2021-12-21 MED ORDER — OXYCODONE HCL 5 MG PO TABS
5.0000 mg | ORAL_TABLET | Freq: Once | ORAL | Status: AC | PRN
  Administered 2021-12-21: 5 mg via ORAL

## 2021-12-21 MED ORDER — MIDAZOLAM HCL 5 MG/5ML IJ SOLN
INTRAMUSCULAR | Status: DC | PRN
  Administered 2021-12-21: 2 mg via INTRAVENOUS

## 2021-12-21 MED ORDER — HYDROMORPHONE HCL 1 MG/ML IJ SOLN: 0.2500 mg | INTRAMUSCULAR | Status: DC | PRN

## 2021-12-21 MED ORDER — LIDOCAINE-EPINEPHRINE 1 %-1:100000 IJ SOLN
INTRAMUSCULAR | Status: DC | PRN
  Administered 2021-12-21: 36 mL via INTRAMUSCULAR

## 2021-12-21 MED ORDER — DEXAMETHASONE SODIUM PHOSPHATE 10 MG/ML IJ SOLN
INTRAMUSCULAR | Status: AC
  Filled 2021-12-21: qty 1

## 2021-12-21 MED ORDER — OXYCODONE HCL 5 MG PO TABS: 5.0000 mg | ORAL_TABLET | ORAL | Status: DC | PRN

## 2021-12-21 MED ORDER — OXYCODONE HCL 5 MG PO TABS
ORAL_TABLET | ORAL | Status: AC
  Filled 2021-12-21: qty 1

## 2021-12-21 MED ORDER — ACETAMINOPHEN 325 MG PO TABS: 650.0000 mg | ORAL_TABLET | ORAL | Status: DC | PRN

## 2021-12-21 MED ORDER — LIDOCAINE-EPINEPHRINE (PF) 1 %-1:200000 IJ SOLN
INTRAMUSCULAR | Status: AC
  Filled 2021-12-21: qty 30

## 2021-12-21 MED ORDER — ACETAMINOPHEN 500 MG PO TABS
1000.0000 mg | ORAL_TABLET | Freq: Once | ORAL | Status: AC
  Administered 2021-12-21: 1000 mg via ORAL

## 2021-12-21 MED ORDER — SODIUM CHLORIDE 0.9 % IV SOLN
INTRAVENOUS | Status: AC
  Filled 2021-12-21: qty 10

## 2021-12-21 MED ORDER — FENTANYL CITRATE (PF) 100 MCG/2ML IJ SOLN
INTRAMUSCULAR | Status: AC
  Filled 2021-12-21: qty 2

## 2021-12-21 MED ORDER — PHENYLEPHRINE 80 MCG/ML (10ML) SYRINGE FOR IV PUSH (FOR BLOOD PRESSURE SUPPORT)
PREFILLED_SYRINGE | INTRAVENOUS | Status: AC
  Filled 2021-12-21: qty 10

## 2021-12-21 MED ORDER — LACTATED RINGERS IV SOLN: INTRAVENOUS | Status: DC

## 2021-12-21 MED ORDER — ACETAMINOPHEN 325 MG RE SUPP: 650.0000 mg | RECTAL | Status: DC | PRN

## 2021-12-21 MED ORDER — ROCURONIUM BROMIDE 10 MG/ML (PF) SYRINGE
PREFILLED_SYRINGE | INTRAVENOUS | Status: AC
  Filled 2021-12-21: qty 10

## 2021-12-21 MED ORDER — SUGAMMADEX SODIUM 200 MG/2ML IV SOLN
INTRAVENOUS | Status: DC | PRN
  Administered 2021-12-21: 200 mg via INTRAVENOUS

## 2021-12-21 MED ORDER — PROPOFOL 10 MG/ML IV BOLUS
INTRAVENOUS | Status: AC
  Filled 2021-12-21: qty 20

## 2021-12-21 MED ORDER — LIDOCAINE HCL (CARDIAC) PF 100 MG/5ML IV SOSY
PREFILLED_SYRINGE | INTRAVENOUS | Status: DC | PRN
  Administered 2021-12-21: 50 mg via INTRAVENOUS

## 2021-12-21 MED ORDER — SCOPOLAMINE 1 MG/3DAYS TD PT72
1.0000 | MEDICATED_PATCH | TRANSDERMAL | Status: DC
  Administered 2021-12-21: 1.5 mg via TRANSDERMAL

## 2021-12-21 MED ORDER — ROCURONIUM BROMIDE 100 MG/10ML IV SOLN
INTRAVENOUS | Status: DC | PRN
  Administered 2021-12-21: 80 mg via INTRAVENOUS
  Administered 2021-12-21: 40 mg via INTRAVENOUS

## 2021-12-21 MED ORDER — SODIUM CHLORIDE 0.9 % IV SOLN: 250.0000 mL | INTRAVENOUS | Status: DC | PRN

## 2021-12-21 MED ORDER — LIDOCAINE HCL (PF) 1 % IJ SOLN
INTRAMUSCULAR | Status: AC
  Filled 2021-12-21: qty 60

## 2021-12-21 MED ORDER — LIDOCAINE 2% (20 MG/ML) 5 ML SYRINGE
INTRAMUSCULAR | Status: AC
  Filled 2021-12-21: qty 5

## 2021-12-21 MED ORDER — BUPIVACAINE HCL (PF) 0.25 % IJ SOLN
INTRAMUSCULAR | Status: AC
  Filled 2021-12-21: qty 30

## 2021-12-21 MED ORDER — ONDANSETRON HCL 4 MG/2ML IJ SOLN
INTRAMUSCULAR | Status: AC
  Filled 2021-12-21: qty 2

## 2021-12-21 MED ORDER — LIDOCAINE-EPINEPHRINE 1 %-1:100000 IJ SOLN
INTRAMUSCULAR | Status: AC
  Filled 2021-12-21: qty 1

## 2021-12-21 MED ORDER — ONDANSETRON HCL 4 MG/2ML IJ SOLN
INTRAMUSCULAR | Status: DC | PRN
  Administered 2021-12-21: 4 mg via INTRAVENOUS

## 2021-12-21 MED ORDER — DEXAMETHASONE SODIUM PHOSPHATE 4 MG/ML IJ SOLN
INTRAMUSCULAR | Status: DC | PRN
  Administered 2021-12-21: 5 mg via INTRAVENOUS

## 2021-12-21 MED ORDER — CEFAZOLIN SODIUM-DEXTROSE 2-4 GM/100ML-% IV SOLN
2.0000 g | INTRAVENOUS | Status: AC
  Administered 2021-12-21: 2 g via INTRAVENOUS

## 2021-12-21 SURGICAL SUPPLY — 68 items
ADH SKN CLS APL DERMABOND .7 (GAUZE/BANDAGES/DRESSINGS) ×2
AGENT HMST PWDR BTL CLGN 5GM (Miscellaneous) ×1 IMPLANT
BAG DECANTER FOR FLEXI CONT (MISCELLANEOUS) ×1 IMPLANT
BINDER BREAST LRG (GAUZE/BANDAGES/DRESSINGS) IMPLANT
BINDER BREAST MEDIUM (GAUZE/BANDAGES/DRESSINGS) IMPLANT
BINDER BREAST XLRG (GAUZE/BANDAGES/DRESSINGS) IMPLANT
BINDER BREAST XXLRG (GAUZE/BANDAGES/DRESSINGS) IMPLANT
BIOPATCH RED 1 DISK 7.0 (GAUZE/BANDAGES/DRESSINGS) IMPLANT
BLADE HEX COATED 2.75 (ELECTRODE) ×1 IMPLANT
BLADE KNIFE PERSONA 10 (BLADE) ×2 IMPLANT
BLADE SURG 15 STRL LF DISP TIS (BLADE) IMPLANT
BLADE SURG 15 STRL SS (BLADE) ×1
CANISTER SUCT 1200ML W/VALVE (MISCELLANEOUS) ×1 IMPLANT
COLLAGEN CELLERATERX 5 GRAM (Miscellaneous) IMPLANT
COVER BACK TABLE 60X90IN (DRAPES) ×1 IMPLANT
COVER MAYO STAND STRL (DRAPES) ×1 IMPLANT
DERMABOND ADVANCED .7 DNX12 (GAUZE/BANDAGES/DRESSINGS) ×2 IMPLANT
DRAIN CHANNEL 19F RND (DRAIN) IMPLANT
DRAPE LAPAROSCOPIC ABDOMINAL (DRAPES) ×1 IMPLANT
DRSG OPSITE POSTOP 4X12 (GAUZE/BANDAGES/DRESSINGS) IMPLANT
DRSG OPSITE POSTOP 4X6 (GAUZE/BANDAGES/DRESSINGS) IMPLANT
ELECT BLADE 4.0 EZ CLEAN MEGAD (MISCELLANEOUS) ×1
ELECT REM PT RETURN 9FT ADLT (ELECTROSURGICAL) ×1
ELECTRODE BLDE 4.0 EZ CLN MEGD (MISCELLANEOUS) ×1 IMPLANT
ELECTRODE REM PT RTRN 9FT ADLT (ELECTROSURGICAL) ×1 IMPLANT
EVACUATOR SILICONE 100CC (DRAIN) IMPLANT
GAUZE PAD ABD 8X10 STRL (GAUZE/BANDAGES/DRESSINGS) ×2 IMPLANT
GAUZE SPONGE 4X4 12PLY STRL LF (GAUZE/BANDAGES/DRESSINGS) IMPLANT
GLOVE BIO SURGEON STRL SZ 6.5 (GLOVE) ×4 IMPLANT
GLOVE BIO SURGEON STRL SZ7.5 (GLOVE) ×1 IMPLANT
GLOVE BIOGEL PI IND STRL 7.0 (GLOVE) ×1 IMPLANT
GOWN STRL REUS W/ TWL LRG LVL3 (GOWN DISPOSABLE) ×2 IMPLANT
GOWN STRL REUS W/TWL LRG LVL3 (GOWN DISPOSABLE) ×4
GOWN STRL REUS W/TWL XL LVL3 (GOWN DISPOSABLE) ×1 IMPLANT
NDL FILTER BLUNT 18X1 1/2 (NEEDLE) ×1 IMPLANT
NDL HYPO 25X1 1.5 SAFETY (NEEDLE) ×1 IMPLANT
NDL SAFETY ECLIP 18X1.5 (MISCELLANEOUS) IMPLANT
NEEDLE FILTER BLUNT 18X1 1/2 (NEEDLE) ×1 IMPLANT
NEEDLE HYPO 25X1 1.5 SAFETY (NEEDLE) ×1 IMPLANT
NS IRRIG 1000ML POUR BTL (IV SOLUTION) IMPLANT
PACK BASIN DAY SURGERY FS (CUSTOM PROCEDURE TRAY) ×1 IMPLANT
PAD ALCOHOL SWAB (MISCELLANEOUS) IMPLANT
PAD FOAM SILICONE BACKED (GAUZE/BANDAGES/DRESSINGS) IMPLANT
PENCIL SMOKE EVACUATOR (MISCELLANEOUS) ×1 IMPLANT
PIN SAFETY STERILE (MISCELLANEOUS) IMPLANT
SLEEVE SCD COMPRESS KNEE MED (STOCKING) ×1 IMPLANT
SPIKE FLUID TRANSFER (MISCELLANEOUS) IMPLANT
SPONGE T-LAP 18X18 ~~LOC~~+RFID (SPONGE) ×2 IMPLANT
STRIP SUTURE WOUND CLOSURE 1/2 (MISCELLANEOUS) ×2 IMPLANT
SUT MNCRL AB 4-0 PS2 18 (SUTURE) ×4 IMPLANT
SUT MON AB 3-0 SH 27 (SUTURE) ×4
SUT MON AB 3-0 SH27 (SUTURE) ×4 IMPLANT
SUT MON AB 5-0 PS2 18 (SUTURE) IMPLANT
SUT PDS 3-0 CT2 (SUTURE) ×4
SUT PDS II 3-0 CT2 27 ABS (SUTURE) ×6 IMPLANT
SUT SILK 3 0 PS 1 (SUTURE) IMPLANT
SYR 3ML 23GX1 SAFETY (SYRINGE) IMPLANT
SYR 50ML LL SCALE MARK (SYRINGE) IMPLANT
SYR BULB IRRIG 60ML STRL (SYRINGE) ×1 IMPLANT
SYR CONTROL 10ML LL (SYRINGE) ×1 IMPLANT
TAPE MEASURE VINYL STERILE (MISCELLANEOUS) IMPLANT
TOWEL GREEN STERILE FF (TOWEL DISPOSABLE) ×2 IMPLANT
TRAY DSU PREP LF (CUSTOM PROCEDURE TRAY) ×1 IMPLANT
TUBE CONNECTING 20X1/4 (TUBING) ×1 IMPLANT
TUBING INFILTRATION IT-10001 (TUBING) IMPLANT
TUBING SET GRADUATE ASPIR 12FT (MISCELLANEOUS) IMPLANT
UNDERPAD 30X36 HEAVY ABSORB (UNDERPADS AND DIAPERS) ×2 IMPLANT
YANKAUER SUCT BULB TIP NO VENT (SUCTIONS) ×1 IMPLANT

## 2021-12-21 NOTE — Op Note (Signed)
Breast Reduction Op note:    DATE OF PROCEDURE: 12/21/2021  LOCATION: [place]  SURGEON: [facility] Takuya Lariccia, DO  ASSISTANT: Donnamarie Rossetti, PA  PREOPERATIVE DIAGNOSIS 1. Macromastia 2. Neck Pain 3. Back Pain  POSTOPERATIVE DIAGNOSIS 1. Macromastia 2. Neck Pain 3. Back Pain  PROCEDURES 1. Bilateral breast reduction.  Right reduction 575 g, Left reduction 725 g  COMPLICATIONS: None.  DRAINS: none  INDICATIONS FOR PROCEDURE Angelica Spencer is a 31 y.o. year-old female born on Nov 02, 1990,with a history of symptomatic macromastia with concominant back pain, neck pain, shoulder grooving from her bra.   MRN: [id number]  CONSENT Informed consent was obtained directly from the patient. The risks, benefits and alternatives were fully discussed. Specific risks including but not limited to bleeding, infection, hematoma, seroma, scarring, pain, nipple necrosis, asymmetry, poor cosmetic results, and need for further surgery were discussed. The patient had ample opportunity to have her questions answered to her satisfaction.  DESCRIPTION OF PROCEDURE  Patient was brought into the operating room and placed in a supine position.  SCDs were placed and appropriate padding was performed.  Antibiotics were given. The patient underwent general anesthesia and the chest was prepped and draped in a sterile fashion.  A timeout was performed and all information was confirmed to be correct. Tumescent placed in the lateral breast area.  Liposuction done laterally on each breast.   Right side: Preoperative markings were confirmed.  Incision lines were injected with local with epinephrine.  After waiting for vasoconstriction, the marked lines were incised.  A Wise-pattern superomedial breast reduction was performed by de-epithelializing the pedicle, using bovie to create the superomedial pedicle, and removing breast tissue from the superior, lateral, and inferior portions of  the breast.  Care was taken to not undermine the breast pedicle. Hemostasis was achieved.  The nipple was gently rotated into position and the soft tissue closed with 4-0 Monocryl.   The pocket was irrigated and hemostasis confirmed.  The deep tissues were approximated with 3-0 PDS and 3-0 Monocryl sutures and the skin was closed with deep dermal and subcuticular 4-0 Monocryl sutures.  The nipple and skin flaps had good capillary refill at the end of the procedure.    Left side: Preoperative markings were confirmed.  Incision lines were injected with local with epinephrine.  After waiting for vasoconstriction, the marked lines were incised.  A Wise-pattern superomedial breast reduction was performed by de-epithelializing the pedicle, using bovie to create the superomedial pedicle, and removing breast tissue from the superior, lateral, and inferior portions of the breast.  Care was taken to not undermine the breast pedicle. Hemostasis was achieved.  The nipple was gently rotated into position and the soft tissue was closed with 4-0 Monocryl.  The patient was sat upright and size and shape symmetry was confirmed.  The pocket was irrigated and hemostasis confirmed.  The deep tissues were approximated with 3-0 PDS and 3-0 Monocryl sutures and the skin was closed with deep dermal and subcuticular 4-0 Monocryl sutures.  Dermabond was applied.  A breast binder and ABDs were placed.  The nipple and skin flaps had good capillary refill at the end of the procedure.  The patient tolerated the procedure well. The patient was allowed to wake from anesthesia and taken to the recovery room in satisfactory condition.  The advanced practice practitioner (APP) assisted throughout the case.  The APP was essential in retraction and counter traction when needed to make the case progress smoothly.  This retraction  and assistance made it possible to see the tissue plans for the procedure.  The assistance was needed for blood control,  tissue re-approximation and assisted with closure of the incision site.

## 2021-12-21 NOTE — Interval H&P Note (Signed)
History and Physical Interval Note:  12/21/2021 7:00 AM  Angelica Spencer  has presented today for surgery, with the diagnosis of Symptomatic mammary hypertrophy.  The various methods of treatment have been discussed with the patient and family. After consideration of risks, benefits and other options for treatment, the patient has consented to  Procedure(s): MAMMARY REDUCTION  (BREAST) (Bilateral) as a surgical intervention.  The patient's history has been reviewed, patient examined, no change in status, stable for surgery.  I have reviewed the patient's chart and labs.  Questions were answered to the patient's satisfaction.     Loel Lofty Senora Lacson

## 2021-12-21 NOTE — Discharge Instructions (Addendum)
INSTRUCTIONS FOR AFTER BREAST SURGERY   You will likely have some questions about what to expect following your operation.  The following information will help you and your family understand what to expect when you are discharged from the hospital.  Following these guidelines will help ensure a smooth recovery and reduce risks of complications.  Postoperative instructions include information on: diet, wound care, medications and physical activity.  AFTER SURGERY Expect to go home after the procedure.  In some cases, you may need to spend one night in the hospital for observation.  DIET Breast surgery does not require a specific diet.  However, the healthier you eat the better your body can start healing. It is important to increasing your protein intake.  This means limiting the foods with sugar and carbohydrates.  Focus on vegetables and some meat.  If you have any liposuction during your procedure be sure to drink water.  If your urine is bright yellow, then it is concentrated, and you need to drink more water.  As a general rule after surgery, you should have 8 ounces of water every hour while awake.  If you find you are persistently nauseated or unable to take in liquids let us know.  NO TOBACCO USE or EXPOSURE.  This will slow your healing process and increase the risk of a wound.  WOUND CARE Leave the binder on for 3 days . Use fragrance free soap.   After 3 days you can remove the binder to shower. Once dry apply binder or sports bra.  Use a mild soap like Dial, Dove and [place]. You may have Topifoam or Lipofoam on.  It is soft and spongy and helps keep you from getting creases if you have liposuction.  This can be removed before the shower and then replaced.  If you need more it is available on [employer] (Lipofoam). If you have steri-strips / tape directly attached to your skin leave them in place. It is OK to get these wet.   No baths, pools or hot tubs for four weeks. We close your incision to  leave the smallest and best-looking scar. No ointment or creams on your incisions until given the go ahead.  Especially not Neosporin (Too many skin reactions with this one).  A few weeks after surgery you can use Mederma and start massaging the scar. We ask you to wear your binder or sports bra for the first 6 weeks around the clock, including while sleeping. This provides added comfort and helps reduce the fluid accumulation at the surgery site.  ACTIVITY No heavy lifting until cleared by the doctor.  This usually means no more than a half-gallon of milk.  It is OK to walk and climb stairs. In fact, moving your legs is very important to decrease your risk of a blood clot.  It will also help keep you from getting deconditioned.  Every 1 to 2 hours get up and walk for 5 minutes. This will help with a quicker recovery back to normal.  Let pain be your guide so you don't do too much.  This is not the time for spring cleaning and don't plan on taking care of anyone else.  This time is for you to recover,  You will be more comfortable if you sleep and rest with your head elevated either with a few pillows under you or in a recliner.  No stomach sleeping for a three months.  WORK Everyone returns to work at different times. As a  rough guide, most people take at least 1 - 2 weeks off prior to returning to work. If you need documentation for your job, bring the forms to your postoperative follow up visit.  DRIVING Arrange for someone to bring you home from the hospital.  You may be able to drive a few days after surgery but not while taking any narcotics or valium.  BOWEL MOVEMENTS Constipation can occur after anesthesia and while taking pain medication.  It is important to stay ahead for your comfort.  We recommend taking Milk of Magnesia (2 tablespoons; twice a day) while taking the pain pills.  MEDICATIONS You may be prescribed should start after surgery At your preoperative visit for you history and  physical you may have been given the following medications: An antibiotic: Start this medication when you get home and take according to the instructions on the bottle. Zofran 4 mg:  This is to treat nausea and vomiting.  You can take this every 6 hours as needed and only if needed. Valium 2 mg: This is for muscle tightness if you have an implant or expander. This will help relax your muscle which also helps with pain control.  This can be taken every 12 hours as needed. Don't drive after taking this medication. Norco (hydrocodone/acetaminophen) 5/325 mg:  This is only to be used after you have taken the motrin or the tylenol. Every 8 hours as needed.   Over the counter Medication to take: Ibuprofen (Motrin) 600 mg:  Take this every 6 hours.  If you have additional pain then take 500 mg of the tylenol every 8 hours.  Only take the Norco after you have tried these two. Miralax or stool softener of choice: Take this according to the bottle if you take the [place] Call your surgeon's office if any of the following occur: Fever 101 degrees F or greater Excessive bleeding or fluid from the incision site. Pain that increases over time without aid from the medications Redness, warmth, or pus draining from incision sites Persistent nausea or inability to take in liquids Severe misshapen area that underwent the operation.   Post Anesthesia Home Care Instructions  Activity: Get plenty of rest for the remainder of the day. A responsible individual must stay with you for 24 hours following the procedure.  For the next 24 hours, DO NOT: -Drive a car -Paediatric nurse -Drink alcoholic beverages -Take any medication unless instructed by your physician -Make any legal decisions or sign important papers.  Meals: Start with liquid foods such as gelatin or soup. Progress to regular foods as tolerated. Avoid greasy, spicy, heavy foods. If nausea and/or vomiting occur, drink only clear  liquids until the nausea and/or vomiting subsides. Call your physician if vomiting continues.  Special Instructions/Symptoms: Your throat may feel dry or sore from the anesthesia or the breathing tube placed in your throat during surgery. If this causes discomfort, gargle with warm salt water. The discomfort should disappear within 24 hours.  If you had a scopolamine patch placed behind your ear for the management of post- operative nausea and/or vomiting:  1. The medication in the patch is effective for 72 hours, after which it should be removed.  Wrap patch in a tissue and discard in the trash. Wash hands thoroughly with soap and water. 2. You may remove the patch earlier than 72 hours if you experience unpleasant side effects which may include dry mouth, dizziness or visual disturbances. 3. Avoid touching the patch.  Wash your hands with soap and water after contact with the patch.      Next dose of Tylenol may be given at 2:00pm if needed.

## 2021-12-21 NOTE — Transfer of Care (Signed)
Immediate Anesthesia Transfer of Care Note  Patient: Angelica Spencer  Procedure(s) Performed: MAMMARY REDUCTION  (BREAST) POSSIBLE LIPOSUCTION (Bilateral: Breast)  Patient Location: PACU  Anesthesia Type:General  Level of Consciousness: awake, alert  and oriented  Airway & Oxygen Therapy: Patient Spontanous Breathing and Patient connected to face mask oxygen  Post-op Assessment: Report given to RN and Post -op Vital signs reviewed and stable  Post vital signs: Reviewed and stable  Last Vitals:  Vitals Value Taken Time  BP 117/80 12/21/21 1100  Temp    Pulse 92 12/21/21 1101  Resp 16 12/21/21 1101  SpO2 98 % 12/21/21 1101  Vitals shown include unvalidated device data.  Last Pain:  Vitals:   12/21/21 0708  PainSc: 0-No pain         Complications: No notable events documented.

## 2021-12-21 NOTE — Anesthesia Postprocedure Evaluation (Signed)
Anesthesia Post Note  Patient: Angelica Spencer  Procedure(s) Performed: MAMMARY REDUCTION  (BREAST) POSSIBLE LIPOSUCTION (Bilateral: Breast)     Patient location during evaluation: PACU Anesthesia Type: General Level of consciousness: awake and alert and oriented Pain management: pain level controlled Vital Signs Assessment: post-procedure vital signs reviewed and stable Respiratory status: spontaneous breathing, nonlabored ventilation and respiratory function stable Cardiovascular status: blood pressure returned to baseline and stable Postop Assessment: no apparent nausea or vomiting Anesthetic complications: no   No notable events documented.  Last Vitals:  Vitals:   12/21/21 1115 12/21/21 1130  BP: 127/67   Pulse: 87 91  Resp: 16 14  Temp:    SpO2: 95% 95%    Last Pain:  Vitals:   12/21/21 1100  PainSc: Asleep                 Karo Rog A.

## 2021-12-21 NOTE — Anesthesia Procedure Notes (Signed)
Procedure Name: Intubation Date/Time: 12/21/2021 8:36 AM  Performed by: Bufford Spikes, CRNAPre-anesthesia Checklist: Patient identified, Emergency Drugs available, Suction available and Patient being monitored Patient Re-evaluated:Patient Re-evaluated prior to induction Oxygen Delivery Method: Circle system utilized Preoxygenation: Pre-oxygenation with 100% oxygen Induction Type: IV induction Ventilation: Mask ventilation without difficulty Laryngoscope Size: Miller and 2 Grade View: Grade II Tube type: Oral Tube size: 7.0 mm Number of attempts: 1 Airway Equipment and Method: Stylet and Oral airway Placement Confirmation: ETT inserted through vocal cords under direct vision, positive ETCO2 and breath sounds checked- equal and bilateral Secured at: 21 cm Tube secured with: Tape Dental Injury: Teeth and Oropharynx as per pre-operative assessment

## 2021-12-22 ENCOUNTER — Encounter (HOSPITAL_BASED_OUTPATIENT_CLINIC_OR_DEPARTMENT_OTHER): Payer: Self-pay | Admitting: Plastic Surgery

## 2021-12-22 LAB — SURGICAL PATHOLOGY

## 2021-12-30 ENCOUNTER — Encounter: Payer: 59 | Admitting: Physician Assistant

## 2021-12-30 ENCOUNTER — Ambulatory Visit (INDEPENDENT_AMBULATORY_CARE_PROVIDER_SITE_OTHER): Payer: 59 | Admitting: Surgical

## 2021-12-30 DIAGNOSIS — N62 Hypertrophy of breast: Secondary | ICD-10-CM

## 2021-12-30 NOTE — Progress Notes (Signed)
Patient is a 31 year old female here for follow-up after bilateral breast reduction Dr. Marla Roe 12/21/2021.  She had 575 g removed from the right breast and 535 g removed from the left breast.  She reports she is overall doing well.  She is not having any infectious symptoms.  She has some questions about restrictions as she has 2  small children at home.  Chaperone present on exam On exam bilateral NAC's are viable, bilateral breast incisions appear intact.  Honeycomb dressings were removed.  There is no erythema or cellulitic changes noted.  She does have some resolving ecchymosis of bilateral breast.  I do not appreciate any subcutaneous fluid collection with palpation.   A/P:  Continue with compressive garments 24/7, avoid heavy lifting more than 15 pounds at this time. Discussed Steri-Strips will fall off over the next few weeks, if not we will reevaluate at her next appointment. No signs of infection. Recommend following up in approximately 2 weeks for reevaluation, she reports she has an appointment scheduled already.  All of her questions were answered to her content.

## 2022-01-17 ENCOUNTER — Encounter: Payer: 59 | Admitting: Physician Assistant

## 2022-01-17 NOTE — Progress Notes (Deleted)
Patient is a 31 year old female with PMH of macromastia s/p bilateral breast reduction performed 12/21/2021 by Dr. Marla Roe who presents to clinic for postoperative follow-up.  She was last seen here in clinic on 12/30/2021.  At that time, honeycomb dressings were removed and bilateral breast incisions appeared intact.  NAC's are viable.  Plan was for continued activity modifications and compressive garments.    Today,

## 2022-01-19 NOTE — Progress Notes (Signed)
Patient is a 31 year old female with PMH of macromastia s/p bilateral breast reduction performed 12/21/2021 by Dr. Marla Roe who presents to clinic for postoperative follow-up.  She was last seen here in clinic on 12/30/2021.  At that time, honeycomb dressings were removed and bilateral breast incisions appeared intact.  NAC's are viable.  Plan was for continued activity modifications and compressive garments.    Today, patient is doing well.  She states that she has had some yellowish drainage from her left vertical incision for the past few days.  She denies any pain and states that otherwise she is doing well.  No other complaints.  Breasts with excellent shape and symmetry.  Steri-Strips are removed from vertical limbs bilaterally.  Lollipop incisions, no inframammary incision.  There is a 2 x 1.5 cm wound at upper half of left vertical limb incision.  No large dehiscence.  No surrounding erythema or malodor.  No drainage.  Breasts are soft.  Discussed reapproximating with thin Steri-Strips that would allow for continued drainage while also helping to better approximate the skin for scarring purposes versus simply allowing the area to heal via secondary intent.  We will use a thin Steri-Strip to reapproximate edges of the wound.  First cleaned the area well.  Return in 2 weeks for follow-up.  She will call clinic should she have any new or worsening symptoms in the interim.  Recommend that she use Vaseline around the remainder of her incisions to help dissolve the residual Dermabond and scabbing.  Continued activity modifications and compressive garments.  Picture(s) obtained of the patient and placed in the chart were with the patient's or guardian's permission.

## 2022-01-20 ENCOUNTER — Ambulatory Visit (INDEPENDENT_AMBULATORY_CARE_PROVIDER_SITE_OTHER): Payer: BC Managed Care – PPO | Admitting: Physician Assistant

## 2022-01-20 ENCOUNTER — Encounter: Payer: Self-pay | Admitting: Physician Assistant

## 2022-01-20 DIAGNOSIS — A09 Infectious gastroenteritis and colitis, unspecified: Secondary | ICD-10-CM | POA: Diagnosis not present

## 2022-01-20 DIAGNOSIS — Z9889 Other specified postprocedural states: Secondary | ICD-10-CM

## 2022-01-24 ENCOUNTER — Telehealth: Payer: Self-pay

## 2022-01-24 NOTE — Telephone Encounter (Signed)
Called pt back after receiving a voicemail regarding steri strip that was placed during ov last Friday with Angelica Spencer came off this morning.   Spoke to Angelica Spencer over the phone and he adv pt to apply just gauze if she was producing drainage. Apply vaseline and guaze if no drainage/bleeding was present.He suggested pt come in to be evaluated if symptoms worsened or wanted to be seen however, it was her choice. Adv pt and she confirmed she applied gauze but she felt better coming in to get another steri strip placed to reduce additional scarring.   Sch pt to come in tomorrow at 8:45 am with Angelica Spencer to have another steri strip placed over breast wound.

## 2022-01-25 ENCOUNTER — Ambulatory Visit (INDEPENDENT_AMBULATORY_CARE_PROVIDER_SITE_OTHER): Payer: Self-pay | Admitting: Physician Assistant

## 2022-01-25 DIAGNOSIS — Z9889 Other specified postprocedural states: Secondary | ICD-10-CM

## 2022-01-25 NOTE — Progress Notes (Signed)
Patient is a very pleasant 31 year old female with PMH of macromastia s/p bilateral breast reduction performed 12/21/2021 by Dr. Marla Roe who presents to clinic for postoperative follow-up.  She was last seen here in clinic on 01/20/2022.  At that time, she had a 2 x 1.5 cm wound at upper half of left vertical limb incision, no other wounds or dehiscence.  No evidence of infection.  Discussed allowing the wound to heal via secondary intent versus reapproximating with a thin Steri-Strip to mitigate scarring yet also permitting drainage in the context of infection.  She opted to proceed.  Today, patient reports that Steri-Strip that was placed last week popped off.  She has been keeping it covered with a nonadherent dressing, but wanted to inquire whether or not Steri-Strip replacement would be appropriate.  She denies any pain, fevers, redness, swelling, or purulent drainage.  Breasts with excellent shape and symmetry.  Some residual Dermabond noted, but no erythema or other overlying skin changes.  Persistent 2 x 1.5 cm wound at upper half of left vertical limb incision, but no other wounds or areas of concern.  Breasts are soft.  Difficult to approximate the skin edges without bunching the surrounding skin.  At this point, feel it is best that we allow it to heal by secondary intent.  Discussed scar mitigation gels, specifically LXBWIOM, to address any widened or hypertrophic scarring that results from her small wound. Wound appears largely unchanged from encounter last week, perhaps a small amount of improved granulation.  Patient is overall pleased and states that it would not bother her if this resulted in a small area of scarring.  Recommending Vaseline followed by nonadherent pad. Return in two weeks for reevaluation.

## 2022-02-03 ENCOUNTER — Encounter: Payer: BC Managed Care – PPO | Admitting: Physician Assistant

## 2022-02-09 NOTE — Progress Notes (Signed)
Patient is a very pleasant 31 year old female with PMH of macromastia s/p bilateral breast reduction performed 12/21/2021 by Dr. Ulice Bold who presents to clinic for postoperative follow-up.   She was last seen here in clinic on 01/25/2022.  At that time, she had a persistent 2 x 1.5 cm wound at upper half of left vertical limb incision, but no other wounds or areas of concern.  Breasts were soft.  Attempted reapproximating skin edges with Steri-Strips earlier on in her postoperative recovery without much success.  Plan was for her to allow it to heal via secondary intent and then address any widened or hypertrophic scarring resulting from the small wound with scar mitigation treatments.  Today, patient is doing well.  States that her wound is healing, albeit slowly.  She reports that it is not draining as much.  Denies any other wounds.  She is overall quite pleased with the outcome of her breast reduction surgery.  She states that ideally her breasts may have been a bit closer to a C cup rather than a D cup, but she understands the limitations of how much tissue can be excised while also preserving shape and nipple viability, particularly considering that she was in H cup preoperatively.  She does feel as though her symptoms of macromastia have improved and aside from her persistent wound she has been doing well.  Physical exam is entirely reassuring.  Wound over left breast vertical incision is approximately 1.75 x 1.25 cm, superficial.  Good granular tissue.  Appears to be healing from the bottom up and suspect that epithelization and wound shrinkage will occur soon.  No surrounding cellulitic findings.  Collagen powder applied in clinic followed by [facility] and gauze secured with Medipore tape.  Suspect that the wound will heal completely in the next 30 to 45 days.  Do not dissipate any ongoing complications.  No evidence concerning for infection.  Wound is superficial.  Recommend that she return in 4 to 6  weeks if her wound persists, but hope that it will have resolved by then.  Discussed scar mitigation treatments.  Picture(s) obtained of the patient and placed in the chart were with the patient's or guardian's permission.  Patient's vital signs for today's encounter are as follows: BP (!) 138/91 (BP Location: Left Arm, Patient Position: Sitting, Cuff Size: Large)   Pulse 79   SpO2 99%   Patient is encouraged to follow-up with their primary care provider for evaluation and management of their mildly elevated blood pressure.  Advised them to obtain an at-home automatic blood pressure cuff and check their blood pressure at the same time each day and before any nicotine or caffeine containing products.

## 2022-02-10 ENCOUNTER — Ambulatory Visit (INDEPENDENT_AMBULATORY_CARE_PROVIDER_SITE_OTHER): Payer: BC Managed Care – PPO | Admitting: Physician Assistant

## 2022-02-10 VITALS — BP 138/91 | HR 79

## 2022-02-10 DIAGNOSIS — Z9889 Other specified postprocedural states: Secondary | ICD-10-CM

## 2022-03-16 DIAGNOSIS — Z1329 Encounter for screening for other suspected endocrine disorder: Secondary | ICD-10-CM | POA: Diagnosis not present

## 2022-03-16 DIAGNOSIS — Z6831 Body mass index (BMI) 31.0-31.9, adult: Secondary | ICD-10-CM | POA: Diagnosis not present

## 2022-03-16 DIAGNOSIS — Z13220 Encounter for screening for lipoid disorders: Secondary | ICD-10-CM | POA: Diagnosis not present

## 2022-03-16 DIAGNOSIS — Z0000 Encounter for general adult medical examination without abnormal findings: Secondary | ICD-10-CM | POA: Diagnosis not present

## 2022-03-16 DIAGNOSIS — Z131 Encounter for screening for diabetes mellitus: Secondary | ICD-10-CM | POA: Diagnosis not present

## 2022-03-16 DIAGNOSIS — Z01419 Encounter for gynecological examination (general) (routine) without abnormal findings: Secondary | ICD-10-CM | POA: Diagnosis not present

## 2022-03-16 LAB — HEMOGLOBIN A1C: Hemoglobin A1C: 5.7

## 2022-04-04 NOTE — Progress Notes (Signed)
Cardiology Office Note:    Date:  04/07/2022   ID:  Angelica Spencer, DOB [birth year], MRN [id number]  PCP:  Gavin Potters, [place]  Cardiologist:  None  Electrophysiologist:  None   Referring MD: Suzan Nailer, CNM   Chief Complaint  Patient presents with   Hypertension    History of Present Illness:    Angelica Spencer is a 32 y.o. female with a hx of elevated blood pressure, PCOS who is referred by Gavin Potters, NP for evaluation of hypertension.  She reports she had gestational hypertension 2022.  Was started on lisinopril postpartum which was eventually discontinued.  However her blood pressure has remained high.  Reports BP 140s to 150s over 90s when she checks at home.  Denies any chest pain, dyspnea, lightheadedness, syncope, lower extremity edema.  Reports palpitations about once per week and will will note on watch that heart rate above 120 when resting.  She reports she had a sleep study done in 2018 that was negative.  No smoking history.  Paternal grandfather had multiple MIs and died of MI at age 46.    Past Medical History:  Diagnosis Date   Anxiety    History of PCOS    Hypothyroidism    Migraines    Tonsillitis     Past Surgical History:  Procedure Laterality Date   BREAST REDUCTION SURGERY Bilateral 12/21/2021   Procedure: MAMMARY REDUCTION  (BREAST) POSSIBLE LIPOSUCTION;  Surgeon: Wallace Going, DO;  Location: [facility];  Service: Plastics;  Laterality: Bilateral;   ROBOTIC ASSISTED DIAGNOSTIC LAPAROSCOPY  09/27/2017   excision of endometriosis, BL ovarian wedge rescetion    Current Medications: Current Meds  Medication Sig   amLODipine (NORVASC) 5 MG tablet Take 1 tablet (5 mg total) by mouth daily.   famotidine (PEPCID) 20 MG tablet Take 20 mg by mouth 2 (two) times daily as needed for heartburn.     Allergies:   Imitrex [sumatriptan]   Social History   Socioeconomic History   Marital status: Married    Spouse name: Not on file    Number of children: Not on file   Years of education: Not on file   Highest education level: Not on file  Occupational History   Not on file  Tobacco Use   Smoking status: Never   Smokeless tobacco: Never  Vaping Use   Vaping Use: Never used  Substance and Sexual Activity   Alcohol use: Never   Drug use: Never   Sexual activity: Yes    Birth control/protection: Pill  Other Topics Concern   Not on file  Social History Narrative   Not on file   Social Determinants of Health   Financial Resource Strain: Not on file  Food Insecurity: Not on file  Transportation Needs: Not on file  Physical Activity: Not on file  Stress: Not on file  Social Connections: Not on file     Family History: Paternal grandfather had multiple MIs and died of MI at age 71.  ROS:   Please see the history of present illness.     All other systems reviewed and are negative.  EKGs/Labs/Other Studies Reviewed:    The following studies were reviewed today:   EKG:   04/07/2022: Sinus tachycardia, rate 112 nonspecific T wave flattening, QTc 458  Recent Labs: No results found for requested labs within last 365 days.  Recent Lipid Panel No results found for: "CHOL", "TRIG", "HDL", "CHOLHDL", "VLDL", "LDLCALC", "LDLDIRECT"  Physical Exam:  VS:  BP (!) 126/94 (BP Location: Left Arm, Patient Position: Sitting, Cuff Size: Large)   Pulse (!) 112   Ht 5\' 3"  (1.6 m)   Wt 218 lb (98.9 kg)   SpO2 98%   BMI 38.62 kg/m     Wt Readings from Last 3 Encounters:  04/07/22 218 lb (98.9 kg)  12/21/21 212 lb 8.4 oz (96.4 kg)  05/20/21 180 lb (81.6 kg)     GEN:  Well nourished, well developed in no acute distress HEENT: Normal NECK: No JVD; No carotid bruits LYMPHATICS: No lymphadenopathy CARDIAC: RRR, no murmurs, rubs, gallops RESPIRATORY:  Clear to auscultation without rales, wheezing or rhonchi  ABDOMEN: Soft, non-tender, non-distended MUSCULOSKELETAL:  No edema; No deformity  SKIN: Warm and  dry NEUROLOGIC:  Alert and oriented x 3 PSYCHIATRIC:  Normal affect   ASSESSMENT:    1. Hypertension, unspecified type   2. Palpitations   3. Snoring    PLAN:    Hypertension: BP elevated in clinic and reports elevated BP when checks at home.  Given age, concerned about secondary causes - Sleep study.  STOP BANG 4 - CMET, TSH, renin/aldosterone,Serum metanephrines - TTE -Consider renal artery duplex if above workup unremarkable -Start amlodipine 5 mg daily.  Recommend checking BP daily for the next 2 weeks and bring log and home BP monitor calibrated appointment with pharmacy hypertension clinic  Palpitations: Description concerning for arrhythmia, evaluate with Zio patch x 2 weeks  Snoring/daytime somnolence: Check sleep study as above   RTC in 3 months   Medication Adjustments/Labs and Tests Ordered: Current medicines are reviewed at length with the patient today.  Concerns regarding medicines are outlined above.  Orders Placed This Encounter  Procedures   Comprehensive metabolic panel   TSH   Aldosterone + renin activity w/ ratio   Metanephrines, plasma   AMB Referral to [facility] Pharm-D   LONG TERM MONITOR (3-14 DAYS)   EKG 12-Lead   ECHOCARDIOGRAM COMPLETE   Itamar Sleep Study   Meds ordered this encounter  Medications   amLODipine (NORVASC) 5 MG tablet    Sig: Take 1 tablet (5 mg total) by mouth daily.    Dispense:  90 tablet    Refill:  3    Patient Instructions  Medication Instructions:  Amlodipine 5mg  daily *If you need a refill on your cardiac medications before your next appointment, please call your pharmacy*   Lab Work: CMET, TSH, RENIN/ALDOSTERONE, SERUM METANEPHRINES If you have labs (blood work) drawn today and your tests are completely normal, you will receive your results only by: [place] (if you have MyChart) OR A paper copy in the mail If you have any lab test that is abnormal or we need to change your treatment, we will call  you to review the results.   Testing/Procedures: [place] physician has requested that you have an echocardiogram. Echocardiography is a painless test that uses sound waves to create images of your heart. It provides your doctor with information about the size and shape of your heart and how well your heart's chambers and valves are working. This procedure takes approximately one hour. There are no restrictions for this procedure. Please do NOT wear cologne, perfume, aftershave, or lotions (deodorant is allowed). Please arrive 15 minutes prior to your appointment time.   Your physician has recommended that you wear a 14 DAY ZIO-PATCH monitor. The Zio patch cardiac monitor continuously records heart rhythm data for up to 14 days, this is  for patients being evaluated for multiple types heart rhythms. For the first 24 hours post application, please avoid getting the Zio monitor wet in the shower or by excessive sweating during exercise. After that, feel free to carry on with regular activities. Keep soaps and lotions away from the ZIO XT Patch.  This will be mailed to you, please expect 7-10 days to receive.    Applying the monitor   Shave hair from upper left chest.   Hold abrader disc by orange tab.  Rub abrader in 40 strokes over left upper chest as indicated in your monitor instructions.   Clean area with 4 enclosed alcohol pads .  Use all pads to assure are is cleaned thoroughly.  Let dry.   Apply patch as indicated in monitor instructions.  Patch will be place under collarbone on left side of chest with arrow pointing upward.   Rub patch adhesive wings for 2 minutes.Remove white label marked "1".  Remove white label marked "2".  Rub patch adhesive wings for 2 additional minutes.   While looking in a mirror, press and release button in center of patch.  A small green light will flash 3-4 times .  This will be your only indicator the monitor has been turned on.     Do not  shower for the first 24 hours.  You may shower after the first 24 hours.   Press button if you feel a symptom. You will hear a small click.  Record Date, Time and Symptom in the Patient Log Book.   When you are ready to remove patch, follow instructions on last 2 pages of Patient Log Book.  Stick patch monitor onto last page of Patient Log Book.   Place Patient Log Book in [place] box.  Use locking tab on box and tape box closed securely.  The [employer] and [employer] has [employer] on it.  Please place in mailbox as soon as possible.  Your physician should have your test results approximately 7 days after the monitor has been mailed back to [facility].   Call [place] at [phone] if you have questions regarding your ZIO XT patch monitor.  Call them immediately if you see an orange light blinking on your monitor.   If your monitor falls off in less than 4 days contact our Monitor department at [phone].  If your monitor becomes loose or falls off after 4 days call [employer] at [phone] for suggestions on securing your monitor    Follow-Up: At [facility], you and your health needs are our priority.  As part of our continuing mission to provide you with exceptional heart care, we have created designated Provider Care Teams.  These Care Teams include your primary Cardiologist (physician) and Advanced Practice Providers (APPs -  Physician Assistants and Nurse Practitioners) who all work together to provide you with the care you need, when you need it.  We recommend signing up for the patient portal called "MyChart".  Sign up information is provided on this After Visit Summary.  MyChart is used to connect with patients for Virtual Visits (Telemedicine).  Patients are able to view lab/test results, encounter notes, upcoming appointments, etc.  Non-urgent messages can be sent to your provider as well.   To learn more about what you can do with MyChart, go to  [email or web address].    Your next appointment:   3 month(s)  The format for your next appointment:   In Person  Provider:  Dr Gardiner Rhyme  Other Instructions CHECK BP'S ONCE DAILY AND KEEP A LOG FOR 2 WEEKS. PHARM-D APPOINTMENT IN 2 WEEKS- BRING BP MONITOR AND BP LOG  Important Information About Sugar          Signed, Donato Heinz, MD  04/07/2022 5:01 PM    [facility]

## 2022-04-07 ENCOUNTER — Ambulatory Visit: Payer: BC Managed Care – PPO | Attending: Cardiology | Admitting: Cardiology

## 2022-04-07 ENCOUNTER — Ambulatory Visit (INDEPENDENT_AMBULATORY_CARE_PROVIDER_SITE_OTHER): Payer: BC Managed Care – PPO

## 2022-04-07 ENCOUNTER — Encounter: Payer: Self-pay | Admitting: Cardiology

## 2022-04-07 VITALS — BP 126/94 | HR 112 | Ht 63.0 in | Wt 218.0 lb

## 2022-04-07 DIAGNOSIS — R002 Palpitations: Secondary | ICD-10-CM

## 2022-04-07 DIAGNOSIS — R0683 Snoring: Secondary | ICD-10-CM | POA: Diagnosis not present

## 2022-04-07 DIAGNOSIS — I10 Essential (primary) hypertension: Secondary | ICD-10-CM | POA: Diagnosis not present

## 2022-04-07 MED ORDER — AMLODIPINE BESYLATE 5 MG PO TABS: 5.0000 mg | ORAL_TABLET | Freq: Every day | ORAL | 3 refills | Status: DC

## 2022-04-07 NOTE — Progress Notes (Unsigned)
Enrolled patient for a 14 day Zio XT  monitor to be mailed to patients home  °

## 2022-04-07 NOTE — Patient Instructions (Addendum)
Medication Instructions:  Amlodipine 5mg  daily *If you need a refill on your cardiac medications before your next appointment, please call your pharmacy*   Lab Work: CMET, TSH, RENIN/ALDOSTERONE, SERUM METANEPHRINES If you have labs (blood work) drawn today and your tests are completely normal, you will receive your results only by: [place] (if you have MyChart) OR A paper copy in the mail If you have any lab test that is abnormal or we need to change your treatment, we will call you to review the results.   Testing/Procedures: [place] physician has requested that you have an echocardiogram. Echocardiography is a painless test that uses sound waves to create images of your heart. It provides your doctor with information about the size and shape of your heart and how well your heart's chambers and valves are working. This procedure takes approximately one hour. There are no restrictions for this procedure. Please do NOT wear cologne, perfume, aftershave, or lotions (deodorant is allowed). Please arrive 15 minutes prior to your appointment time.   Your physician has recommended that you wear a 14 DAY ZIO-PATCH monitor. The Zio patch cardiac monitor continuously records heart rhythm data for up to 14 days, this is for patients being evaluated for multiple types heart rhythms. For the first 24 hours post application, please avoid getting the Zio monitor wet in the shower or by excessive sweating during exercise. After that, feel free to carry on with regular activities. Keep soaps and lotions away from the ZIO XT Patch.  This will be mailed to you, please expect 7-10 days to receive.    Applying the monitor   Shave hair from upper left chest.   Hold abrader disc by orange tab.  Rub abrader in 40 strokes over left upper chest as indicated in your monitor instructions.   Clean area with 4 enclosed alcohol pads .  Use all pads to assure are is cleaned thoroughly.  Let  dry.   Apply patch as indicated in monitor instructions.  Patch will be place under collarbone on left side of chest with arrow pointing upward.   Rub patch adhesive wings for 2 minutes.Remove white label marked "1".  Remove white label marked "2".  Rub patch adhesive wings for 2 additional minutes.   While looking in a mirror, press and release button in center of patch.  A small green light will flash 3-4 times .  This will be your only indicator the monitor has been turned on.     Do not shower for the first 24 hours.  You may shower after the first 24 hours.   Press button if you feel a symptom. You will hear a small click.  Record Date, Time and Symptom in the Patient Log Book.   When you are ready to remove patch, follow instructions on last 2 pages of Patient Log Book.  Stick patch monitor onto last page of Patient Log Book.   Place Patient Log Book in [place] box.  Use locking tab on box and tape box closed securely.  The [employer] and [employer] has [employer] on it.  Please place in mailbox as soon as possible.  Your physician should have your test results approximately 7 days after the monitor has been mailed back to [facility].   Call [place] at [phone] if you have questions regarding your ZIO XT patch monitor.  Call them immediately if you see an orange light blinking on your monitor.  If your monitor falls off in less than 4 days contact our Monitor department at [phone].  If your monitor becomes loose or falls off after 4 days call [employer] at [phone] for suggestions on securing your monitor    Follow-Up: At [facility], you and your health needs are our priority.  As part of our continuing mission to provide you with exceptional heart care, we have created designated Provider Care Teams.  These Care Teams include your primary Cardiologist (physician) and Advanced Practice Providers (APPs -  Physician Assistants and Nurse  Practitioners) who all work together to provide you with the care you need, when you need it.  We recommend signing up for the patient portal called "MyChart".  Sign up information is provided on this After Visit Summary.  MyChart is used to connect with patients for Virtual Visits (Telemedicine).  Patients are able to view lab/test results, encounter notes, upcoming appointments, etc.  Non-urgent messages can be sent to your provider as well.   To learn more about what you can do with MyChart, go to [email or web address].    Your next appointment:   3 month(s)  The format for your next appointment:   In Person  Provider:   Dr Gardiner Rhyme  Other Instructions CHECK BP'S Startup 2 WEEKS. PHARM-D APPOINTMENT IN 2 WEEKS- BRING BP MONITOR AND BP LOG  Important Information About Sugar

## 2022-04-10 ENCOUNTER — Telehealth: Payer: Self-pay | Admitting: *Deleted

## 2022-04-10 ENCOUNTER — Telehealth: Payer: Self-pay

## 2022-04-10 NOTE — Telephone Encounter (Signed)
Prior Authorization for [employer] sent to [employer] via [employer]. Auth  Number [id number].  Valid dates 04/10/22 to 06/08/22. Debria Garret notified ok to have patient to activate the device.

## 2022-04-10 NOTE — Telephone Encounter (Signed)
I called the patient to speak with her concerning her Itamar device, I LVM for patient to return my care

## 2022-04-10 NOTE — Telephone Encounter (Signed)
Patient was returning call. Please advise ?

## 2022-04-11 DIAGNOSIS — R002 Palpitations: Secondary | ICD-10-CM | POA: Diagnosis not present

## 2022-04-11 NOTE — Telephone Encounter (Signed)
Patient returned call

## 2022-04-13 ENCOUNTER — Ambulatory Visit: Payer: BC Managed Care – PPO | Admitting: Nurse Practitioner

## 2022-04-13 ENCOUNTER — Encounter: Payer: Self-pay | Admitting: Nurse Practitioner

## 2022-04-13 VITALS — BP 132/98 | HR 92 | Temp 97.3°F | Ht 63.78 in | Wt 216.2 lb

## 2022-04-13 DIAGNOSIS — F32A Depression, unspecified: Secondary | ICD-10-CM

## 2022-04-13 DIAGNOSIS — E669 Obesity, unspecified: Secondary | ICD-10-CM | POA: Diagnosis not present

## 2022-04-13 DIAGNOSIS — I10 Essential (primary) hypertension: Secondary | ICD-10-CM | POA: Insufficient documentation

## 2022-04-13 DIAGNOSIS — F419 Anxiety disorder, unspecified: Secondary | ICD-10-CM | POA: Diagnosis not present

## 2022-04-13 DIAGNOSIS — K219 Gastro-esophageal reflux disease without esophagitis: Secondary | ICD-10-CM | POA: Diagnosis not present

## 2022-04-13 MED ORDER — SERTRALINE HCL 25 MG PO TABS: 25.0000 mg | ORAL_TABLET | Freq: Every day | ORAL | 2 refills | Status: DC

## 2022-04-13 NOTE — Progress Notes (Addendum)
New Patient Visit  BP (!) 132/98 (BP Location: Right Arm, Cuff Size: Large)   Pulse 92   Temp (!) 97.3 F (36.3 C)   Ht 5' 3.78" (1.62 m)   Wt 216 lb 3.2 oz (98.1 kg)   LMP 03/03/2022   SpO2 97%   BMI 37.37 kg/m    Subjective:    Patient ID: Angelica Spencer, female    DOB: 08-Mar-1991, 32 y.o.   MRN: [id number]  CC: Chief Complaint  Patient presents with   New Patient (Initial Visit)    New patient , est care, discuss weight diet, recurrently dx with hth follow up cardiology     HPI: Angelica Spencer is a 32 y.o. female presents for new patient visit to establish care.  Introduced to Designer, jewellery role and practice setting.  All questions answered.  Discussed provider/patient relationship and expectations.  She moved form [place], [place] about a year ago and is looking to establish with a primary care provider. She is married and has 2 children. She works as a Education officer, museum.   She has a history of depression and anxiety. She was on medication in the past including celexa which did not help and zoloft which stopped working after several years on it. She last took this about 8 years ago. She was then following  with a therapist. She feels like her symptoms are re-occurring and she would like to restart medication. She denies SI/HI.   She has a history of hypertension that started during her pregnancy. She was taking nifedipine and then she was started on amlodipine 5mg  daily. She started this medication on Sunday. Her blood pressure was in the 140s-150s and now it's in the 120s-130s at home. She is following with cardiology and is scheduled for a sleep study, echocardiogram, and event monitor. She denies chest pain, shortness of breath, and headaches.      04/13/2022    8:55 PM  Depression screen PHQ 2/9  Decreased Interest 1  Down, Depressed, Hopeless 1  PHQ - 2 Score 2  Altered sleeping 3  Tired, decreased energy 3  Change in appetite 2  Feeling bad or failure about  yourself  0  Trouble concentrating 2  Moving slowly or fidgety/restless 0  Suicidal thoughts 0  PHQ-9 Score 12  Difficult doing work/chores Somewhat difficult      04/13/2022    8:55 PM  GAD 7 : Generalized Anxiety Score  Nervous, Anxious, on Edge 3  Control/stop worrying 1  Worry too much - different things 1  Trouble relaxing 2  Restless 0  Easily annoyed or irritable 3  Afraid - awful might happen 0  Total GAD 7 Score 10  Anxiety Difficulty Somewhat difficult   Past Medical History:  Diagnosis Date   Anxiety    Back pain 05/20/2021   Depression    Endometriosis    GERD (gastroesophageal reflux disease)    History of PCOS    Hyperlipidemia 03/07/2022   Hypertension 09/02/2020   Hypothyroidism    Migraines    Neck pain 05/20/2021   Symptomatic mammary hypertrophy 05/20/2021   Tonsillitis     Past Surgical History:  Procedure Laterality Date   BREAST REDUCTION SURGERY Bilateral 12/21/2021   Procedure: MAMMARY REDUCTION  (BREAST) POSSIBLE LIPOSUCTION;  Surgeon: Wallace Going, DO;  Location: [facility];  Service: Plastics;  Laterality: Bilateral;   ROBOTIC ASSISTED DIAGNOSTIC LAPAROSCOPY  09/27/2017   excision of endometriosis, BL ovarian wedge rescetion  Family History  Problem Relation Age of Onset   Cancer Mother        non-hodgkins lymphoma   Hyperlipidemia Father    Hypertension Father    Anxiety disorder Sister    Depression Sister    Bipolar disorder Sister    Cancer Maternal Grandmother        hodgkins lymphoma   COPD Paternal Grandmother    Diabetes Paternal Grandmother    Obesity Paternal Grandfather      Social History   Tobacco Use   Smoking status: Never   Smokeless tobacco: Never  Vaping Use   Vaping Use: Never used  Substance Use Topics   Alcohol use: Yes    Alcohol/week: 1.0 standard drink of alcohol    Types: 1 Standard drinks or equivalent per week   Drug use: Never    Current Outpatient Medications on  File Prior to Visit  Medication Sig Dispense Refill   acetaminophen (TYLENOL) 500 MG tablet Take 2 tablets (1,000 mg total) by mouth every 6 (six) hours as needed. (Patient taking differently: Take 1,000 mg by mouth every 6 (six) hours as needed for mild pain.) 30 tablet 0   amLODipine (NORVASC) 5 MG tablet Take 1 tablet (5 mg total) by mouth daily. 90 tablet 3   famotidine (PEPCID) 20 MG tablet Take 20 mg by mouth 2 (two) times daily as needed for heartburn.     ibuprofen (ADVIL) 600 MG tablet Take 1 tablet (600 mg total) by mouth every 6 (six) hours as needed. (Patient taking differently: Take 600 mg by mouth every 6 (six) hours as needed for mild pain.) 30 tablet 0   No current facility-administered medications on file prior to visit.     Review of Systems  Constitutional:  Positive for fatigue. Negative for fever.  HENT: Negative.    Respiratory: Negative.    Cardiovascular: Negative.   Gastrointestinal:  Positive for constipation (alternate) and diarrhea (alternates). Negative for abdominal pain.  Genitourinary: Negative.   Musculoskeletal: Negative.   Skin: Negative.   Neurological: Negative.   Psychiatric/Behavioral:  Positive for dysphoric mood. The patient is nervous/anxious.       Objective:    BP (!) 132/98 (BP Location: Right Arm, Cuff Size: Large)   Pulse 92   Temp (!) 97.3 F (36.3 C)   Ht 5' 3.78" (1.62 m)   Wt 216 lb 3.2 oz (98.1 kg)   LMP 03/03/2022   SpO2 97%   BMI 37.37 kg/m   Wt Readings from Last 3 Encounters:  04/13/22 216 lb 3.2 oz (98.1 kg)  04/07/22 218 lb (98.9 kg)  12/21/21 212 lb 8.4 oz (96.4 kg)    BP Readings from Last 3 Encounters:  04/13/22 (!) 132/98  04/07/22 (!) 126/94  02/10/22 (!) 138/91    Physical Exam Vitals and nursing note reviewed.  Constitutional:      General: She is not in acute distress.    Appearance: Normal appearance.  HENT:     Head: Normocephalic and atraumatic.     Right Ear: Tympanic membrane, ear canal and  external ear normal.     Left Ear: Tympanic membrane, ear canal and external ear normal.  Eyes:     Conjunctiva/sclera: Conjunctivae normal.  Cardiovascular:     Rate and Rhythm: Normal rate and regular rhythm.     Pulses: Normal pulses.     Heart sounds: Normal heart sounds.  Pulmonary:     Effort: Pulmonary effort is normal.  Breath sounds: Normal breath sounds.  Abdominal:     Palpations: Abdomen is soft.     Tenderness: There is no abdominal tenderness.  Musculoskeletal:        General: Normal range of motion.     Cervical back: Normal range of motion and neck supple.     Right lower leg: No edema.     Left lower leg: No edema.  Lymphadenopathy:     Cervical: No cervical adenopathy.  Skin:    General: Skin is warm and dry.  Neurological:     General: No focal deficit present.     Mental Status: She is alert and oriented to person, place, and time.     Cranial Nerves: No cranial nerve deficit.     Coordination: Coordination normal.     Gait: Gait normal.  Psychiatric:        Mood and Affect: Mood normal.        Behavior: Behavior normal.        Thought Content: Thought content normal.        Judgment: Judgment normal.        Assessment & Plan:   Problem List Items Addressed This Visit       Cardiovascular and Mediastinum   Primary hypertension - Primary    Chronic, improving. BP today 132/98. Continue amlodipine 5mg  daily. Keep any scheduled testing and follow-up appointments with cardiology.         Digestive   Gastroesophageal reflux disease    Chronic, stable. Continue pepcid 20mg  BID prn symptoms.         Other   Anxiety and depression    Chronic, not controlled. She had taken medication in the past, celexa did not help and zoloft stopped working after several years. She has not taken medication in the past 8 years. She denies SI/HI. Her PHQ 9 is a 12 and her GAD 7 is a 10. Will have her restart sertraline 25mg  daily. Follow up in 4-6 weeks.        Relevant Medications   sertraline (ZOLOFT) 25 MG tablet   Obesity (BMI 30-39.9)    BMI 37.3. Discussed nutrition and exercise. She can track calories with an app like myfitness pal or lose it. Discussed making small changes.         Follow up plan: Return in about 4 weeks (around 05/11/2022) for 4-6 weeks , Anxiety, Depression.

## 2022-04-13 NOTE — Assessment & Plan Note (Signed)
Chronic, improving. BP today 132/98. Continue amlodipine 5mg  daily. Keep any scheduled testing and follow-up appointments with cardiology.

## 2022-04-13 NOTE — Patient Instructions (Signed)
It was great to see you!  Start zoloft 1 tablet daily.   Let's follow-up in 4-6 weeks, sooner if you have concerns.  If a referral was placed today, you will be contacted for an appointment. Please note that routine referrals can sometimes take up to 3-4 weeks to process. Please call our office if you haven't heard anything after this time frame.  Take care,  Vance Peper, NP

## 2022-04-13 NOTE — Assessment & Plan Note (Signed)
Chronic, stable. Continue pepcid 20mg  BID prn symptoms.

## 2022-04-13 NOTE — Assessment & Plan Note (Signed)
BMI 37.3. Discussed nutrition and exercise. She can track calories with an app like myfitness pal or lose it. Discussed making small changes.

## 2022-04-13 NOTE — Assessment & Plan Note (Signed)
Chronic, not controlled. She had taken medication in the past, celexa did not help and zoloft stopped working after several years. She has not taken medication in the past 8 years. She denies SI/HI. Her PHQ 9 is a 12 and her GAD 7 is a 10. Will have her restart sertraline 25mg  daily. Follow up in 4-6 weeks.

## 2022-04-14 DIAGNOSIS — R002 Palpitations: Secondary | ICD-10-CM

## 2022-04-27 ENCOUNTER — Encounter: Payer: Self-pay | Admitting: Pharmacist Clinician (PhC)/ Clinical Pharmacy Specialist

## 2022-04-27 ENCOUNTER — Ambulatory Visit
Payer: BC Managed Care – PPO | Attending: Internal Medicine | Admitting: Pharmacist Clinician (PhC)/ Clinical Pharmacy Specialist

## 2022-04-27 VITALS — BP 127/86 | HR 84

## 2022-04-27 DIAGNOSIS — I10 Essential (primary) hypertension: Secondary | ICD-10-CM | POA: Diagnosis not present

## 2022-04-27 LAB — TSH: TSH: 1.64 u[IU]/mL (ref 0.450–4.500)

## 2022-04-27 LAB — ALDOSTERONE + RENIN ACTIVITY W/ RATIO
Aldos/Renin Ratio: 21.2 (ref 0.0–30.0)
Aldosterone: 42.2 ng/dL — ABNORMAL HIGH (ref 0.0–30.0)
Renin Activity, Plasma: 1.986 ng/mL/hr (ref 0.167–5.380)

## 2022-04-27 LAB — METANEPHRINES, PLASMA
Metanephrine, Free: <25 pg/mL (ref 0.0–88.0)
Normetanephrine, Free: 67.7 pg/mL (ref 0.0–210.1)

## 2022-04-27 MED ORDER — CHLORTHALIDONE 25 MG PO TABS: 12.5000 mg | ORAL_TABLET | Freq: Every day | ORAL | 3 refills | Status: DC

## 2022-04-27 NOTE — Assessment & Plan Note (Addendum)
Assessment: BP is uncontrolled in office BP 127/86 mmHg;  above the goal (<130/80). Currently not considering pregnancy - is aware of medication concerns should she decide otherwise Tolerates amlodipine well without any side effects  Denies SOB, palpitation, chest pain, headaches,or swelling Reiterated the importance of regular exercise and low salt diet   Plan:  Start taking chlorthalidone 12.5 mg once daily Continue taking amlodipine 5 mg once daily Patient to keep record of BP readings with heart rate and report to [place] at the next visit Patient to follow up with PharmD in 1 month for follow up  Labs ordered today:  BMET - in 2 weeks

## 2022-04-27 NOTE — Patient Instructions (Addendum)
Follow up appointment: 1 month  Go to the lab in 2 weeks to check kidney function  Take your BP meds as follows:   Start chlorthalidone 12.5 mg once daily  Continue with amlodipine 5 mg daily  Check your blood pressure at home daily (if able) and keep record of the readings.  Hypertension "High blood pressure"  Hypertension is often called "The Silent Killer." It rarely causes symptoms until it is extremely  high or has done damage to other organs in the body. For this reason, you should have your  blood pressure checked regularly by your physician. We will check your blood pressure  every time you see a provider at one of our offices.   Your blood pressure reading consists of two numbers. Ideally, blood pressure should be  below 120/80. The first ("top") number is called the systolic pressure. It measures the  pressure in your arteries as your heart beats. The second ("bottom") number is called the diastolic pressure. It measures the pressure in your arteries as the heart relaxes between beats.  The benefits of getting your blood pressure under control are enormous. A 10-point  reduction in systolic blood pressure can reduce your risk of stroke by 27% and heart failure by 28%  Your blood pressure goal is <130/80  To check your pressure at home you will need to:  1. Sit up in a chair, with feet flat on the floor and back supported. Do not cross your ankles or legs. 2. Rest your left arm so that the cuff is about heart level. If the cuff goes on your upper arm,  then just relax the arm on the table, arm of the chair or your lap. If you have a wrist cuff, we  suggest relaxing your wrist against your chest (think of it as Pledging the Flag with the  wrong arm).  3. Place the cuff snugly around your arm, about 1 inch above the crook of your elbow. The  cords should be inside the groove of your elbow.  4. Sit quietly, with the cuff in place, for about 5 minutes. After that 5 minutes  press the power  button to start a reading. 5. Do not talk or move while the reading is taking place.  6. Record your readings on a sheet of paper. Although most cuffs have a memory, it is often  easier to see a pattern developing when the numbers are all in front of you.  7. You can repeat the reading after 1-3 minutes if it is recommended  Make sure your bladder is empty and you have not had caffeine or tobacco within the last 30 min  Always bring your blood pressure log with you to your appointments. If you have not brought your monitor in to be double checked for accuracy, please bring it to your next appointment.  You can find a list of quality blood pressure cuffs at validatebp.org

## 2022-04-27 NOTE — Progress Notes (Signed)
Office Visit    Patient Name: Angelica Spencer Date of Encounter: 04/27/2022  Primary Care Provider:  Charyl Dancer, NP Primary Cardiologist:  None  Chief Complaint    Hypertension  Significant Past Medical History   palpitations Wearing 14 day monitor now  migraines   hypothyroidism 1/24 TSH WNL  anxiety On sertraline 25 mg qd       Allergies  Allergen Reactions   Imitrex [Sumatriptan]     Pt reports heart palpitations     History of Present Illness    Angelica Spencer is a 32 y.o. female patient of Dr Gardiner Rhyme, in the office for hypertension evaluation.  She was seen by Dr. Gardiner Rhyme earlier this month, with a blood pressure of 126/94 and HR of 112.   Today she notes that she was found to be hypertensive at her 37 week appointment in June of 2022 and was taken to the hospital that day and induced.  She took nifedipine for about 6-8 weeks after delivery, but then was cleared to stop by her nurse midwife.  Her son is now 9 months old, and she also has a 77 year old daughter.  Currently not planning any more children, but is aware to let us know should she decide to try for a third baby.  Her husband is a Software engineer with [employer].     Dr. Gardiner Rhyme started her on amlodipine 5 mg when he saw her earlier this month.  She notes her pressure did come down some, although she is still seeing diastolic readings well into the 90's.      Blood Pressure Goal:  130/80  Current Medications: amlodipine 5 mg qd  Previously tried: nifedipine - post partum  Family Hx:  paternal grandfather wit multiple MI, died at 27;  father has htn for most of adult life , paternal grandmother stents, (smoker); mother family without heart history; 1 sister, younger, without hypertension;  2 kids (3, 19 months)  Social Hx:      Tobacco: no  Alcohol: maybe 1 per week liquor  Caffeine:  weaned to half caffeine coffee, 12 oz soda - diet pepsi once daily  Diet:   eat out 1-2 times per week, otherwise home,  rarely adds salt; plenty of proteins, mix of vegetables, mostly frozen/canned  Exercise: no regular exercise   Home BP readings:  17 readings average 130/80  (range 114-149/65-98)  Adherence Assessment  Do you ever forget to take your medication? [] Yes [x] No  Do you ever skip doses due to side effects? [] Yes [x] No  Do you have trouble affording your medicines? [] Yes [x] No  Are you ever unable to pick up your medication due to transportation difficulties? [] Yes [x] No  Do you ever stop taking your medications because you don't believe they are helping? [] Yes [x] No    Accessory Clinical Findings    Lab Results  Component Value Date   CREATININE 0.76 12/28/2020   BUN 12 12/28/2020   NA 136 12/28/2020   K 3.9 12/28/2020   CL 101 12/28/2020   CO2 24 12/28/2020   Lab Results  Component Value Date   ALT 20 12/28/2020   AST 17 12/28/2020   ALKPHOS 81 12/28/2020   BILITOT 0.8 12/28/2020   No results found for: "HGBA1C"  Home Medications    Current Outpatient Medications  Medication Sig Dispense Refill   chlorthalidone (HYGROTON) 25 MG tablet Take 0.5 tablets (12.5 mg total) by mouth daily. 45 tablet 3   acetaminophen (TYLENOL) 500 MG tablet  Take 2 tablets (1,000 mg total) by mouth every 6 (six) hours as needed. (Patient taking differently: Take 1,000 mg by mouth every 6 (six) hours as needed for mild pain.) 30 tablet 0   amLODipine (NORVASC) 5 MG tablet Take 1 tablet (5 mg total) by mouth daily. 90 tablet 3   famotidine (PEPCID) 20 MG tablet Take 20 mg by mouth 2 (two) times daily as needed for heartburn.     ibuprofen (ADVIL) 600 MG tablet Take 1 tablet (600 mg total) by mouth every 6 (six) hours as needed. (Patient taking differently: Take 600 mg by mouth every 6 (six) hours as needed for mild pain.) 30 tablet 0   sertraline (ZOLOFT) 25 MG tablet Take 1 tablet (25 mg total) by mouth daily. 30 tablet 2   No current facility-administered medications for this visit.     Screening for Secondary Hypertension:      04/27/2022   10:57 AM  Causes  Drugs/Herbals Screened  Sleep Apnea Screened     - Comments sleep study later this month  Thyroid Disease Screened  Hyperaldosteronism Screened  Pheochromocytoma Screened  Compliance Screened    Relevant Labs/Studies:    Latest Ref Rng & Units 12/28/2020    9:35 PM 09/04/2020    4:48 AM 09/02/2020    6:22 PM  Basic Labs  Sodium 135 - 145 mmol/L 136  137  134   Potassium 3.5 - 5.1 mmol/L 3.9  4.1  4.5   Creatinine 0.44 - 1.00 mg/dL 0.76  0.67  0.73        Latest Ref Rng & Units 04/11/2022   10:03 AM  Thyroid   TSH 0.450 - 4.500 uIU/mL 1.640        Latest Ref Rng & Units 04/11/2022   10:03 AM  Renin/Aldosterone   Aldos/Renin Ratio 0.0 - 30.0 21.2        Latest Ref Rng & Units 04/11/2022   10:03 AM  Metanephrines/Catecholamines   Metanephrines 0.0 - 88.0 pg/mL Comment  P  Normetanephrines  0.0 - 210.1 pg/mL Comment  P    P Preliminary result              Assessment & Plan    Primary hypertension Assessment: BP is uncontrolled in office BP 127/86 mmHg;  above the goal (<130/80). Currently not considering pregnancy - is aware of medication concerns should she decide otherwise Tolerates amlodipine well without any side effects  Denies SOB, palpitation, chest pain, headaches,or swelling Reiterated the importance of regular exercise and low salt diet   Plan:  Start taking chlorthalidone 12.5 mg once daily Continue taking amlodipine 5 mg once daily Patient to keep record of BP readings with heart rate and report to [place] at the next visit Patient to follow up with PharmD in 1 month for follow up  Labs ordered today:  BMET - in 2 weeks    Tommy Medal PharmD CPP [facility]  [address] [place], [place] [address] [phone]

## 2022-04-28 ENCOUNTER — Other Ambulatory Visit: Payer: Self-pay | Admitting: *Deleted

## 2022-04-28 DIAGNOSIS — I10 Essential (primary) hypertension: Secondary | ICD-10-CM

## 2022-04-28 DIAGNOSIS — E269 Hyperaldosteronism, unspecified: Secondary | ICD-10-CM

## 2022-05-01 ENCOUNTER — Encounter: Payer: Self-pay | Admitting: Cardiology

## 2022-05-02 ENCOUNTER — Encounter: Payer: Self-pay | Admitting: Cardiology

## 2022-05-02 DIAGNOSIS — R002 Palpitations: Secondary | ICD-10-CM | POA: Diagnosis not present

## 2022-05-02 DIAGNOSIS — E269 Hyperaldosteronism, unspecified: Secondary | ICD-10-CM

## 2022-05-02 DIAGNOSIS — I10 Essential (primary) hypertension: Secondary | ICD-10-CM

## 2022-05-03 ENCOUNTER — Ambulatory Visit (HOSPITAL_COMMUNITY): Payer: BC Managed Care – PPO | Attending: Cardiology

## 2022-05-03 DIAGNOSIS — R002 Palpitations: Secondary | ICD-10-CM | POA: Insufficient documentation

## 2022-05-03 DIAGNOSIS — I10 Essential (primary) hypertension: Secondary | ICD-10-CM | POA: Diagnosis not present

## 2022-05-03 LAB — ECHOCARDIOGRAM COMPLETE
Area-P 1/2: 4.52 cm2
S' Lateral: 3 cm

## 2022-05-03 NOTE — Telephone Encounter (Signed)
Could we refer her outside of [place] or [place]?  Would they may be able to get her in quicker?

## 2022-05-05 ENCOUNTER — Encounter (INDEPENDENT_AMBULATORY_CARE_PROVIDER_SITE_OTHER): Payer: BC Managed Care – PPO | Admitting: Cardiology

## 2022-05-05 DIAGNOSIS — G4733 Obstructive sleep apnea (adult) (pediatric): Secondary | ICD-10-CM | POA: Diagnosis not present

## 2022-05-08 ENCOUNTER — Ambulatory Visit: Payer: BC Managed Care – PPO | Attending: Cardiology

## 2022-05-08 DIAGNOSIS — R0683 Snoring: Secondary | ICD-10-CM

## 2022-05-08 NOTE — Procedures (Signed)
Patient Information Study Date: 05/05/2022 Patient Name: Angelica Spencer Patient ID: [id number] Birth Date: 1990/12/22 Age: 32 Gender: Female BMI: 19.9 (W=112 lb, H=5' 3'') Referring Physician: Oswaldo Milian, MD  TEST DESCRIPTION:  Home sleep apnea testing was completed using the WatchPat, a Type 1 device, utilizing peripheral arterial tonometry (PAT), chest movement, actigraphy, pulse oximetry, pulse rate, body position and snore.  AHI was calculated with apnea and hypopnea using valid sleep time as the denominator. RDI includes apneas, hypopneas, and RERAs.  The data acquired and the scoring of sleep and all associated events were performed in accordance with the recommended standards and specifications as outlined in the AASM Manual for the Scoring of Sleep and Associated Events 2.2.0 (2015).  FINDINGS:  1.  Moderate Obstructive Sleep Apnea with AHI 21.9/hr.   2.  No Central Sleep Apnea with pAHIc 0.9/hr.  3.  Oxygen desaturations as low as 87%.  4.  Moderate snoring was present. O2 sats were < 88% for  min.  5.  Total sleep time was 7 hrs and 24 min.  6.  27.3% of total sleep time was spent in REM sleep.   7.  Shortened sleep onset latency at 6 min  8.  Shortened REM sleep onset latency at 61 min.   9.  Total awakenings were 23.  10. Arrhythmia detection:  Suggestive of possible brief atrial fibrillation lasting 39seconds.  This is not diagnostic and further testing with outpatient telemetry monitoring is recommended.  DIAGNOSIS:   Moderate Obstructive Sleep Apnea (G47.33) Possible atrial arrhythmias  RECOMMENDATIONS:   1.  Clinical correlation of these findings is necessary.  The decision to treat obstructive sleep apnea (OSA) is usually based on the presence of apnea symptoms or the presence of associated medical conditions such as Hypertension, Congestive Heart Failure, Atrial Fibrillation or Obesity.  The most common symptoms of OSA are snoring, gasping for breath  while sleeping, daytime sleepiness and fatigue.   2.  Initiating apnea therapy is recommended given the presence of symptoms and/or associated conditions. Recommend proceeding with one of the following:     a.  Auto-CPAP therapy with a pressure range of 5-20cm H2O.     b.  An oral appliance (OA) that can be obtained from certain dentists with expertise in sleep medicine.  These are primarily of use in non-obese patients with mild and moderate disease.     c.  An ENT consultation which may be useful to look for specific causes of obstruction and possible treatment options.     d.  If patient is intolerant to PAP therapy, consider referral to ENT for evaluation for hypoglossal nerve stimulator.   3.  Close follow-up is necessary to ensure success with CPAP or oral appliance therapy for maximum benefit.  4.  A follow-up oximetry study on CPAP is recommended to assess the adequacy of therapy and determine the need for supplemental oxygen or the potential need for Bi-level therapy.  An arterial blood gas to determine the adequacy of baseline ventilation and oxygenation should also be considered.  5.  Healthy sleep recommendations include:  adequate nightly sleep (normal 7-9 hrs/night), avoidance of caffeine after noon and alcohol near bedtime, and maintaining a sleep environment that is cool, dark and quiet.  6.  Weight loss for overweight patients is recommended.  Even modest amounts of weight loss can significantly improve the severity of sleep apnea.  7.  Snoring recommendations include:  weight loss where appropriate, side sleeping, and avoidance of alcohol before  bed  8.  Operation of motor vehicle should not be performed when sleepy.  9.  Consider outpatient event monitor to assess for silent atrial arrhythmias.  Signature: Fransico Him, MD; [facility]; [place], [place] Board of Sleep Medicine Electronically Signed: 05/08/2022

## 2022-05-09 ENCOUNTER — Encounter: Payer: Self-pay | Admitting: Nurse Practitioner

## 2022-05-11 ENCOUNTER — Encounter: Payer: Self-pay | Admitting: Nurse Practitioner

## 2022-05-11 ENCOUNTER — Ambulatory Visit: Payer: BC Managed Care – PPO | Admitting: Nurse Practitioner

## 2022-05-11 ENCOUNTER — Other Ambulatory Visit (HOSPITAL_COMMUNITY): Payer: Self-pay

## 2022-05-11 VITALS — BP 118/88 | HR 89 | Temp 97.6°F | Ht 63.78 in | Wt 211.0 lb

## 2022-05-11 DIAGNOSIS — K219 Gastro-esophageal reflux disease without esophagitis: Secondary | ICD-10-CM

## 2022-05-11 DIAGNOSIS — F419 Anxiety disorder, unspecified: Secondary | ICD-10-CM

## 2022-05-11 DIAGNOSIS — I10 Essential (primary) hypertension: Secondary | ICD-10-CM

## 2022-05-11 DIAGNOSIS — F32A Depression, unspecified: Secondary | ICD-10-CM

## 2022-05-11 MED ORDER — SERTRALINE HCL 50 MG PO TABS: 50.0000 mg | ORAL_TABLET | Freq: Every day | ORAL | 2 refills | Status: DC

## 2022-05-11 MED ORDER — SAXENDA 18 MG/3ML ~~LOC~~ SOPN
PEN_INJECTOR | SUBCUTANEOUS | 0 refills | Status: DC
  Filled 2022-05-11: qty 6, 28d supply, fill #0

## 2022-05-11 NOTE — Assessment & Plan Note (Signed)
BMI 36.47 associated with hypertension, GERD, anxiety, and depression. She has lost 5 pounds since her last visit, congratulated her on this! Encouraged her to continue limiting eating out to once a week and to continue walking routinely. She is interested in medication to assist her with weight loss. Will have her start saxenda 0.6mg  daily injection and increase by 0.6mg  every 7 days. Discussed possible side effect. Follow-up in 2 months.

## 2022-05-11 NOTE — Progress Notes (Signed)
Established Patient Office Visit  Subjective   Patient ID: Angelica Spencer, female    DOB: 08-21-1990  Age: 32 y.o. MRN: [id number]  Chief Complaint  Patient presents with   Follow-up    4 week FU, no concerns    HPI  Angelica Spencer is here to follow-up on depression and anxiety along with weight management.   She started sertraline 25mg  daily last visit. She states that she has noticed some improvement in her symptoms, however she is still irritable at times. She is not having any side effects from the medications.      05/11/2022    9:50 AM 04/13/2022    8:55 PM  Depression screen PHQ 2/9  Decreased Interest 1 1  Down, Depressed, Hopeless 1 1  PHQ - 2 Score 2 2  Altered sleeping 3 3  Tired, decreased energy 3 3  Change in appetite 0 2  Feeling bad or failure about yourself  0 0  Trouble concentrating 1 2  Moving slowly or fidgety/restless 0 0  Suicidal thoughts 0 0  PHQ-9 Score 9 12  Difficult doing work/chores Somewhat difficult Somewhat difficult      05/11/2022    9:51 AM 04/13/2022    8:55 PM  GAD 7 : Generalized Anxiety Score  Nervous, Anxious, on Edge 2 3  Control/stop worrying 0 1  Worry too much - different things 1 1  Trouble relaxing 1 2  Restless 0 0  Easily annoyed or irritable 3 3  Afraid - awful might happen 0 0  Total GAD 7 Score 7 10  Anxiety Difficulty Somewhat difficult Somewhat difficult   She is also wanting to discuss her weight. She states that this is the highest weight that she has been. Her sister in law took saxenda and liked the medication and lost weight on it. She has been trying to eat out less often and has been walking more. She has lost 5 pounds since her last visit 4 weeks ago. She has also switched from regular soda to 1 diet soda a day.     ROS See pertinent positives and negatives per HPI.    Objective:     BP 118/88 (BP Location: Right Arm, Cuff Size: Large)   Pulse 89   Temp 97.6 F (36.4 C)   Ht 5' 3.78" (1.62 m)    Wt 211 lb (95.7 kg)   LMP 04/13/2022 (Exact Date)   SpO2 99%   BMI 36.47 kg/m  BP Readings from Last 3 Encounters:  05/11/22 118/88  04/27/22 127/86  04/13/22 (!) 132/98   Wt Readings from Last 3 Encounters:  05/11/22 211 lb (95.7 kg)  04/13/22 216 lb 3.2 oz (98.1 kg)  04/07/22 218 lb (98.9 kg)      Physical Exam Vitals and nursing note reviewed.  Constitutional:      General: She is not in acute distress.    Appearance: Normal appearance.  HENT:     Head: Normocephalic.  Eyes:     Conjunctiva/sclera: Conjunctivae normal.  Cardiovascular:     Rate and Rhythm: Normal rate and regular rhythm.     Pulses: Normal pulses.     Heart sounds: Normal heart sounds.  Pulmonary:     Effort: Pulmonary effort is normal.     Breath sounds: Normal breath sounds.  Musculoskeletal:     Cervical back: Normal range of motion.  Skin:    General: Skin is warm.  Neurological:     General: No focal deficit  present.     Mental Status: She is alert and oriented to person, place, and time.  Psychiatric:        Mood and Affect: Mood normal.        Behavior: Behavior normal.        Thought Content: Thought content normal.        Judgment: Judgment normal.      Assessment & Plan:   Problem List Items Addressed This Visit       Cardiovascular and Mediastinum   Primary hypertension - Primary    Chronic, improving but not fully controlled. She was recently started on chlorthalidone 12.5mg  daily in addition to amlodipine 5mg  daily. She has an appointment to follow-up with the pharmacist with cardiology next week.         Digestive   Gastroesophageal reflux disease    Chronic, stable. Continue pepcid 20mg  BID prn symptoms.         Other   Anxiety and depression    Chronic, improving, still not controlled. She was started on sertraline last visit which helped her symptoms some, however not completely. Will increase her sertraline to 50mg  daily. She denies SI/HI. Follow-up in 2  months.       Relevant Medications   sertraline (ZOLOFT) 50 MG tablet   Morbid obesity (HCC)    BMI 36.47 associated with hypertension, GERD, anxiety, and depression. She has lost 5 pounds since her last visit, congratulated her on this! Encouraged her to continue limiting eating out to once a week and to continue walking routinely. She is interested in medication to assist her with weight loss. Will have her start saxenda 0.6mg  daily injection and increase by 0.6mg  every 7 days. Discussed possible side effect. Follow-up in 2 months.       Relevant Medications   Liraglutide -Weight Management (SAXENDA) 18 MG/3ML SOPN    Return in about 2 months (around 07/10/2022) for weight management, anxiety.    Charyl Dancer, NP

## 2022-05-11 NOTE — Patient Instructions (Signed)
It was great to see you!  Let's increase your zoloft to 50mg  daily.   Start saxenda injection once a day, start 0.6mg  daily, then 1.2mg  daily.   Let's follow-up in 2 months, sooner if you have concerns.  If a referral was placed today, you will be contacted for an appointment. Please note that routine referrals can sometimes take up to 3-4 weeks to process. Please call our office if you haven't heard anything after this time frame.  Take care,  Vance Peper, NP

## 2022-05-11 NOTE — Assessment & Plan Note (Signed)
Chronic, stable. Continue pepcid 20mg  BID prn symptoms.

## 2022-05-11 NOTE — Assessment & Plan Note (Signed)
Chronic, improving, still not controlled. She was started on sertraline last visit which helped her symptoms some, however not completely. Will increase her sertraline to 50mg  daily. She denies SI/HI. Follow-up in 2 months.

## 2022-05-11 NOTE — Assessment & Plan Note (Signed)
Chronic, improving but not fully controlled. She was recently started on chlorthalidone 12.5mg  daily in addition to amlodipine 5mg  daily. She has an appointment to follow-up with the pharmacist with cardiology next week.

## 2022-05-12 ENCOUNTER — Other Ambulatory Visit (HOSPITAL_COMMUNITY): Payer: Self-pay

## 2022-05-12 ENCOUNTER — Encounter: Payer: Self-pay | Admitting: Nurse Practitioner

## 2022-05-15 ENCOUNTER — Telehealth: Payer: Self-pay | Admitting: *Deleted

## 2022-05-15 ENCOUNTER — Other Ambulatory Visit: Payer: Self-pay | Admitting: Cardiology

## 2022-05-15 DIAGNOSIS — G4733 Obstructive sleep apnea (adult) (pediatric): Secondary | ICD-10-CM

## 2022-05-15 NOTE — Telephone Encounter (Signed)
Patient returned a call to me and was given sleep study results and recommendations. She agrees to proceed with CPAP titration pending insurance approval.

## 2022-05-15 NOTE — Telephone Encounter (Signed)
-----   Message from Sueanne Margarita, MD sent at 05/08/2022 12:14 PM EST ----- Please let patient know that they have sleep apnea.  Recommend therapeutic CPAP titration for treatment of patient's sleep disordered breathing.  If unable to perform an in lab titration then initiate ResMed auto CPAP from 4 to 15cm H2O with heated humidity and mask of choice and overnight pulse ox on CPAP.

## 2022-05-15 NOTE — Telephone Encounter (Signed)
Left message to return a call to discuss sleep study results and recommendations. 

## 2022-05-15 NOTE — Telephone Encounter (Signed)
-----   Message from Traci R Turner, MD sent at 05/08/2022 12:14 PM EST ----- Please let patient know that they have sleep apnea.  Recommend therapeutic CPAP titration for treatment of patient's sleep disordered breathing.  If unable to perform an in lab titration then initiate ResMed auto CPAP from 4 to 15cm H2O with heated humidity and mask of choice and overnight pulse ox on CPAP.   

## 2022-05-17 ENCOUNTER — Encounter: Payer: Self-pay | Admitting: Nurse Practitioner

## 2022-05-18 DIAGNOSIS — I10 Essential (primary) hypertension: Secondary | ICD-10-CM | POA: Diagnosis not present

## 2022-05-19 LAB — BASIC METABOLIC PANEL
BUN/Creatinine Ratio: 21 (ref 9–23)
BUN: 15 mg/dL (ref 6–20)
CO2: 24 mmol/L (ref 20–29)
Calcium: 10 mg/dL (ref 8.7–10.2)
Chloride: 97 mmol/L (ref 96–106)
Creatinine, Ser: 0.7 mg/dL (ref 0.57–1.00)
Glucose: 124 mg/dL — ABNORMAL HIGH (ref 70–99)
Potassium: 3.7 mmol/L (ref 3.5–5.2)
Sodium: 138 mmol/L (ref 134–144)
eGFR: 120 mL/min/{1.73_m2} (ref >59)

## 2022-05-24 ENCOUNTER — Telehealth: Payer: Self-pay | Admitting: *Deleted

## 2022-05-24 NOTE — Telephone Encounter (Signed)
Prior Authorization for CPAP titration sent to [facility] via web portal. Order Number [id number]. Valid dates 05/24/22 to 07/22/22.

## 2022-05-26 ENCOUNTER — Other Ambulatory Visit (HOSPITAL_COMMUNITY): Payer: Self-pay

## 2022-05-26 MED ORDER — ZEPBOUND 2.5 MG/0.5ML ~~LOC~~ SOAJ: 2.5000 mg | SUBCUTANEOUS | 1 refills | Status: DC

## 2022-05-26 NOTE — Telephone Encounter (Signed)
Ran test claim and was given this rejection, usually when we see this, any PA will come back denied, telling us that it's a plan exclusion. No PA submitted at this time.

## 2022-05-29 ENCOUNTER — Other Ambulatory Visit (HOSPITAL_COMMUNITY): Payer: Self-pay

## 2022-05-30 ENCOUNTER — Encounter: Payer: Self-pay | Admitting: Pharmacist

## 2022-05-30 ENCOUNTER — Ambulatory Visit: Payer: BC Managed Care – PPO | Attending: Cardiology | Admitting: Pharmacist

## 2022-05-30 VITALS — BP 123/83 | HR 85

## 2022-05-30 DIAGNOSIS — I10 Essential (primary) hypertension: Secondary | ICD-10-CM | POA: Diagnosis not present

## 2022-05-30 NOTE — Patient Instructions (Addendum)
It was nice meeting you today  We would like your blood pressure to be less than 130/80  Try switching your chlorthalidone 12.'5mg'$  to the morning and keep your amlodipine '5mg'$  at night  Contact the sleep lab to schedule your visit  Continue your healthy diet changes. Concentrate on lean proteins, healthy fats, vegetables and whole grains  Continue to check your readings at home and feel free to send them over via [place], PharmD, [place], [place], [place], [place], [place], [address] Phone: [phone], Fax: [phone]

## 2022-05-30 NOTE — Progress Notes (Signed)
Patient ID: Jaleeya Saturday                 DOB: September 10, 1990                      MRN: [id number]     HPI: Angelica Spencer is a 32 y.o. female referred by Dr. Gardiner Rhyme to HTN clinic. PMH is significant for HTN, PCOS, and anxiety.  At last visit with PharmD patient was started on chlorthalidone 12.'5mg'$  daily.  Patient presents today for follow up. Tolerating chlorthalidone addition but does not think it has made much impact. Takes at night and has to urinate frequently in the morning. Also checks her BP at night after putting kids to bed.  Forgot log but thinks readings are in mid 130s over mid 80s.   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  Is working on diet changes and reports an 8# recent weight loss. Has discontinued sodas and switched to diet. Frequently ate fast food but now has not eaten fast food in about 3 weeks. Is working on eating more salads, drinking more water and eating more lean protein. PCP attempted to prescribe GLP1 but insurance would not cover.  Diagnosed with OSA and needs to have CPAP titration scheduled.  Current HTN meds:  Amlodipine '5mg'$  daily Chlorthalidone 12.'5mg'$  daily  BP goal: <130/80  Wt Readings from Last 3 Encounters:  05/11/22 211 lb (95.7 kg)  04/13/22 216 lb 3.2 oz (98.1 kg)  04/07/22 218 lb (98.9 kg)   BP Readings from Last 3 Encounters:  05/11/22 118/88  04/27/22 127/86  04/13/22 (!) 132/98   Pulse Readings from Last 3 Encounters:  05/11/22 89  04/27/22 84  04/13/22 92    Renal function: CrCl cannot be calculated (Unknown ideal weight.).  Past Medical History:  Diagnosis Date   Anxiety    Back pain 05/20/2021   Depression    Endometriosis    GERD (gastroesophageal reflux disease)    History of PCOS    Hyperlipidemia 03/07/2022   Hypertension 09/02/2020   Hypothyroidism    Migraines    Neck pain 05/20/2021   Sleep apnea    Symptomatic mammary hypertrophy 05/20/2021    Tonsillitis     Current Outpatient Medications on File Prior to Visit  Medication Sig Dispense Refill   ALPRAZolam (XANAX) 0.5 MG tablet Take by mouth.     levothyroxine (SYNTHROID) 50 MCG tablet Take by mouth.     metFORMIN (GLUCOPHAGE) 1000 MG tablet Take by mouth.     acetaminophen (TYLENOL) 500 MG tablet Take 2 tablets (1,000 mg total) by mouth every 6 (six) hours as needed. (Patient taking differently: Take 1,000 mg by mouth every 6 (six) hours as needed for mild pain.) 30 tablet 0   amLODipine (NORVASC) 5 MG tablet Take 1 tablet (5 mg total) by mouth daily. 90 tablet 3   chlorthalidone (HYGROTON) 25 MG tablet Take 0.5 tablets (12.5 mg total) by mouth daily. 45 tablet 3   famotidine (PEPCID) 20 MG tablet Take 20 mg by mouth 2 (two) times daily as needed for heartburn.     ibuprofen (ADVIL) 600 MG tablet Take 1 tablet (600 mg total) by mouth every 6 (six) hours as needed. (Patient taking differently: Take 600 mg by mouth every 6 (six) hours as needed for mild pain.) 30 tablet 0   sertraline (ZOLOFT) 50 MG tablet Take 1 tablet (50 mg  total) by mouth daily. 30 tablet 2   tirzepatide (ZEPBOUND) 2.5 MG/0.5ML Pen Inject 2.5 mg into the skin once a week. 2 mL 1   No current facility-administered medications on file prior to visit.    Allergies  Allergen Reactions   Imitrex [Sumatriptan]     Pt reports heart palpitations      Assessment/Plan:    1. Hypertension -  Patient BP in room 123/83 which is improved and only slightly above goal of <130/80. Likely stress and OSA are contributing.   Recommended switching chlorthalidone to mornign administration and sending over BP readings through myChart.   Discussed with sleep coordinator regarding CPAP titration who provided phone number for sleep lab to speed up scheduling process. Patient appreciative.  Discussed healthy eating with patient and applauded weight loss. Encouraged continuing avoiding fast food and sodas. Concentrate on  vegetables, lean proteins, healthy fats, and whole grains.  Recheck in 1 month  Switch chlorthalidone 12.'5mg'$  to morning Continue amlodipine '5mg'$  in evening Recheck in 4 weeks  Karren Cobble, PharmD, [place], [place], [place], [place], [place], [address] Phone: [phone], Fax: [phone]

## 2022-06-13 DIAGNOSIS — E269 Hyperaldosteronism, unspecified: Secondary | ICD-10-CM | POA: Diagnosis not present

## 2022-06-13 DIAGNOSIS — I10 Essential (primary) hypertension: Secondary | ICD-10-CM | POA: Diagnosis not present

## 2022-06-14 ENCOUNTER — Ambulatory Visit (HOSPITAL_BASED_OUTPATIENT_CLINIC_OR_DEPARTMENT_OTHER): Payer: BC Managed Care – PPO | Attending: Cardiology | Admitting: Cardiovascular Disease

## 2022-06-14 VITALS — Ht 63.0 in | Wt 208.0 lb

## 2022-06-14 DIAGNOSIS — G4733 Obstructive sleep apnea (adult) (pediatric): Secondary | ICD-10-CM | POA: Diagnosis not present

## 2022-06-19 ENCOUNTER — Encounter: Payer: Self-pay | Admitting: Nurse Practitioner

## 2022-06-19 ENCOUNTER — Telehealth: Payer: Self-pay | Admitting: Cardiology

## 2022-06-19 DIAGNOSIS — Z640 Problems related to unwanted pregnancy: Secondary | ICD-10-CM | POA: Diagnosis not present

## 2022-06-19 DIAGNOSIS — Z3200 Encounter for pregnancy test, result unknown: Secondary | ICD-10-CM | POA: Diagnosis not present

## 2022-06-19 DIAGNOSIS — Z3689 Encounter for other specified antenatal screening: Secondary | ICD-10-CM | POA: Diagnosis not present

## 2022-06-19 DIAGNOSIS — I10 Essential (primary) hypertension: Secondary | ICD-10-CM

## 2022-06-19 NOTE — Telephone Encounter (Signed)
Patient aware and verbalized understanding.  She will reach out to PCP to discuss Zoloft.    Referral to cardioOB placed.

## 2022-06-19 NOTE — Telephone Encounter (Signed)
New Message:     Patient says she just found out that she was pregnant,. She wants to know if there needs to be any adjustments to her medicine?

## 2022-06-19 NOTE — Telephone Encounter (Signed)
Routed to pharmD to review. 

## 2022-06-19 NOTE — Telephone Encounter (Signed)
Recommend she hold chlorthalidone, sertraline, and amlodipine until she can be seen by PCP and/or OB

## 2022-06-21 DIAGNOSIS — Z3687 Encounter for antenatal screening for uncertain dates: Secondary | ICD-10-CM | POA: Diagnosis not present

## 2022-06-21 DIAGNOSIS — Z640 Problems related to unwanted pregnancy: Secondary | ICD-10-CM | POA: Diagnosis not present

## 2022-06-22 ENCOUNTER — Encounter: Payer: Self-pay | Admitting: Cardiology

## 2022-06-22 ENCOUNTER — Ambulatory Visit: Payer: BC Managed Care – PPO | Attending: Cardiology | Admitting: Cardiology

## 2022-06-22 VITALS — BP 126/88 | HR 96 | Ht 63.0 in | Wt 213.0 lb

## 2022-06-22 DIAGNOSIS — I10 Essential (primary) hypertension: Secondary | ICD-10-CM | POA: Diagnosis not present

## 2022-06-22 DIAGNOSIS — O10919 Unspecified pre-existing hypertension complicating pregnancy, unspecified trimester: Secondary | ICD-10-CM | POA: Diagnosis not present

## 2022-06-22 DIAGNOSIS — Z3A01 Less than 8 weeks gestation of pregnancy: Secondary | ICD-10-CM

## 2022-06-22 DIAGNOSIS — G4733 Obstructive sleep apnea (adult) (pediatric): Secondary | ICD-10-CM | POA: Diagnosis not present

## 2022-06-22 DIAGNOSIS — E269 Hyperaldosteronism, unspecified: Secondary | ICD-10-CM | POA: Diagnosis not present

## 2022-06-22 DIAGNOSIS — I4729 Other ventricular tachycardia: Secondary | ICD-10-CM

## 2022-06-22 DIAGNOSIS — O99210 Obesity complicating pregnancy, unspecified trimester: Secondary | ICD-10-CM

## 2022-06-22 MED ORDER — AMLODIPINE BESYLATE 5 MG PO TABS: 5.0000 mg | ORAL_TABLET | Freq: Every day | ORAL | 3 refills | Status: DC

## 2022-06-22 NOTE — Patient Instructions (Addendum)
Medication Instructions:  Your physician has recommended you make the following change in your medication:  START: Amlodipine 5 mg once daily  Please take your blood pressure daily for 2 weeks and send in a MyChart message. Please include heart rates.   HOW TO TAKE YOUR BLOOD PRESSURE: Rest 5 minutes before taking your blood pressure. Don't smoke or drink caffeinated beverages for at least 30 minutes before. Take your blood pressure before (not after) you eat. Sit comfortably with your back supported and both feet on the floor (don't cross your legs). Elevate your arm to heart level on a table or a desk. Use the proper sized cuff. It should fit smoothly and snugly around your bare upper arm. There should be enough room to slip a fingertip under the cuff. The bottom edge of the cuff should be 1 inch above the crease of the elbow. Ideally, take 3 measurements at one sitting and record the average.  *If you need a refill on your cardiac medications before your next appointment, please call your pharmacy*   Lab Work: None   Testing/Procedures: None   Follow-Up: At [facility], you and your health needs are our priority.  As part of our continuing mission to provide you with exceptional heart care, we have created designated Provider Care Teams.  These Care Teams include your primary Cardiologist (physician) and Advanced Practice Providers (APPs -  Physician Assistants and Nurse Practitioners) who all work together to provide you with the care you need, when you need it.  Your next appointment:   12 week(s)  Provider:   Berniece Salines, DO

## 2022-06-25 ENCOUNTER — Encounter (HOSPITAL_BASED_OUTPATIENT_CLINIC_OR_DEPARTMENT_OTHER): Payer: Self-pay | Admitting: Cardiovascular Disease

## 2022-06-25 NOTE — Procedures (Signed)
Patient Name: Angelica Spencer, Angelica Spencer Date: 06/14/2022 Gender: Female D.O.B: 1990-12-30 Age (years): 31 Referring Provider: Fransico Him MD, ABSM Height (inches): 63 Interpreting Physician: Shelva Majestic MD, ABSM Weight (lbs): 208 RPSGT: Laren Everts BMI: 37 MRN: [id number] Neck Size: 15.25  CLINICAL INFORMATION The patient is referred for a CPAP titration to treat sleep apnea.  Date of WatchPat HST: 05/05/2022: AHI 21.9/h; O2 nadir 87%, possible brief run of PAF  SLEEP STUDY TECHNIQUE As per the AASM Manual for the Scoring of Sleep and Associated Events v2.3 (April 2016) with a hypopnea requiring 4% desaturations.  The channels recorded and monitored were frontal, central and occipital EEG, electrooculogram (EOG), submentalis EMG (chin), nasal and oral airflow, thoracic and abdominal wall motion, anterior tibialis EMG, snore microphone, electrocardiogram, and pulse oximetry. Continuous positive airway pressure (CPAP) was initiated at the beginning of the study and titrated to treat sleep-disordered breathing.  MEDICATIONS acetaminophen (TYLENOL) 500 MG tablet amLODipine (NORVASC) 5 MG tablet famotidine (PEPCID) 20 MG tablet Ferrous Sulfate (IRON PO) sertraline (ZOLOFT) 50 MG tablet Medications self-administered by patient taken the night of the study : N/A  TECHNICIAN COMMENTS Comments added by technician: NONE Comments added by scorer: N/A  RESPIRATORY PARAMETERS Optimal PAP Pressure (cm): 9 AHI at Optimal Pressure (/hr): 0 Overall Minimal O2 (%): 92.0 Supine % at Optimal Pressure (%): 45 Minimal O2 at Optimal Pressure (%): 94.0   SLEEP ARCHITECTURE The study was initiated at 10:25:06 PM and ended at 5:32:48 AM.  Sleep onset time was 69.3 minutes and the sleep efficiency was 80.4%. The total sleep time was 344 minutes.  The patient spent 8.7% of the night in stage N1 sleep, 60.0% in stage N2 sleep, 0.0% in stage N3 and 31.3% in REM.Stage REM latency was  78.5 minutes  Wake after sleep onset was 14.4. Alpha intrusion was absent. Supine sleep was 64.63%.  CARDIAC DATA The 2 lead EKG demonstrated sinus rhythm. The mean heart rate was 74.6 beats per minute. Other EKG findings include: None.  LEG MOVEMENT DATA The total Periodic Limb Movements of Sleep (PLMS) were 0. The PLMS index was 0.0. A PLMS index of <15 is considered normal in adults.  IMPRESSIONS - CPAP was initiated at 5 cm and was titrated to optimal PAP pressure at 9 cm of water. ( AHI 0; O2 nadir 94%) - Significant oxygen desaturations were not observed during this titration (min O2 92.0%). - The patient snored with moderate snoring volume during this titration study. - No cardiac abnormalities were observed during this study. - Clinically significant periodic limb movements were not noted during this study. Arousals associated with PLMs were rare.  DIAGNOSIS - Obstructive Sleep Apnea (G47.33)  RECOMMENDATIONS - Recommend an initial trial of CPAP Auto therapy with EPR of 3 at 8 - 13 cm H2O with heated humidification. A Medium size Resmed Full Face AirFit F30i mask was used for the titration. - Effort shoud be made to optimize nasal and oropharyngeal patency. - Avoid alcohol, sedatives and other CNS depressants that may worsen sleep apnea and disrupt normal sleep architecture. - Sleep hygiene should be reviewed to assess factors that may improve sleep quality. - Weight management (BMI 37) and regular exercise should be initiated or continued. - Recommend a download and sleep clinic evaluation after 4 - 6 weeks of therapy   [Electronically signed] 06/25/2022 05:25 PM  Shelva Majestic MD, [facility], [place], American Board of Sleep Medicine  NPI: [id number]  [place]  PH: [phone]   FX: [phone] [place]

## 2022-06-25 NOTE — Progress Notes (Signed)
Cardio-Obstetrics Clinic  New Evaluation  Date:  06/25/2022   ID:  Angelica Spencer, DOB [birth year], MRN [id number]  PCP:  Charyl Dancer, NP   [facility] Providers Cardiologist:  None  Electrophysiologist:  None       Referring MD: Donato Heinz*   Chief Complaint: " I am doing ok"  History of Present Illness:    Angelica Spencer is a 32 y.o. female [G3P2002] who is being seen today for the evaluation of chronic hypertension in pregnancy at the request of Donato Heinz*.   Medical history hypertension, PCOS, anxiety here today to establish in the cardio obstetrics clinic as she just found out that she is pregnant.  She has not seen OB/GYN yet she is not sure how many weeks pregnant she is. She has been referred to have cardiovascular care in pregnancy and once she has delivered and is stable in the postpartum.  Will transition her back to her primary cardiologist Dr. Gardiner Rhyme.  She has no specific complaints at this time.  She tells me that she has stopped the amlodipine and chlorthalidone since knowing that she is pregnant.   Prior CV Studies Reviewed: The following studies were reviewed today:  TTE 05/03/2022 IMPRESSIONS   1. Left ventricular ejection fraction, by estimation, is 60 to 65%. The  left ventricle has normal function. The left ventricle has no regional  wall motion abnormalities. Left ventricular diastolic parameters were  normal. The average left ventricular  global longitudinal strain is -17.9 %. The global longitudinal strain is  normal.   2. Right ventricular systolic function is normal. The right ventricular  size is normal.   3. The mitral valve is normal in structure. No evidence of mitral valve  regurgitation. No evidence of mitral stenosis.   4. The aortic valve is tricuspid. Aortic valve regurgitation is not  visualized. No aortic stenosis is present.   5. The inferior vena cava is normal in size with greater than  50%  respiratory variability, suggesting right atrial pressure of 3 mmHg.   FINDINGS   Left Ventricle: Left ventricular ejection fraction, by estimation, is 60  to 65%. The left ventricle has normal function. The left ventricle has no  regional wall motion abnormalities. The average left ventricular global  longitudinal strain is -17.9 %.  The global longitudinal strain is normal. The left ventricular internal  cavity size was normal in size. There is no left ventricular hypertrophy.  Left ventricular diastolic parameters were normal.   Right Ventricle: The right ventricular size is normal. No increase in  right ventricular wall thickness. Right ventricular systolic function is  normal.   Left Atrium: Left atrial size was normal in size.   Right Atrium: Right atrial size was normal in size.   Pericardium: There is no evidence of pericardial effusion.   Mitral Valve: The mitral valve is normal in structure. No evidence of  mitral valve regurgitation. No evidence of mitral valve stenosis.   Tricuspid Valve: The tricuspid valve is normal in structure. Tricuspid  valve regurgitation is not demonstrated. No evidence of tricuspid  stenosis.   Aortic Valve: The aortic valve is tricuspid. Aortic valve regurgitation is  not visualized. No aortic stenosis is present.   Pulmonic Valve: The pulmonic valve was normal in structure. Pulmonic valve  regurgitation is not visualized. No evidence of pulmonic stenosis.   Aorta: The aortic root is normal in size and structure.   Venous: The inferior vena cava is normal in size with  greater than 50%  respiratory variability, suggesting right atrial pressure of 3 mmHg.    Zio monitor 05/02/2022   1 episode of NSVT lasting 4 beats    Patch Wear Time:  13 days and 21 hours (2024-01-12T10:05:42-0500 to 2024-01-26T07:23:41-0500)   Patient had a min HR of 47 bpm, max HR of 190 bpm, and avg HR of 83 bpm. Predominant underlying rhythm was Sinus  Rhythm. 1 run of Ventricular Tachycardia occurred lasting 4 beats with a max rate of 190 bpm (avg 154 bpm). Ventricular Tachycardia was  detected within +/- 45 seconds of symptomatic patient event(s). Isolated SVEs were rare (<1.0%), and no SVE Couplets or SVE Triplets were present. Isolated VEs were rare (<1.0%), and no VE Couplets or VE Triplets were present.  21 patient triggered events, corresponding to sinus rhythm plus or minus PACs, and an episode of NSVT  Past Medical History:  Diagnosis Date   Anxiety    Back pain 05/20/2021   Depression    Endometriosis    GERD (gastroesophageal reflux disease)    History of PCOS    Hyperlipidemia 03/07/2022   Hypertension 09/02/2020   Hypothyroidism    Migraines    Neck pain 05/20/2021   Sleep apnea    Symptomatic mammary hypertrophy 05/20/2021   Tonsillitis     Past Surgical History:  Procedure Laterality Date   BREAST REDUCTION SURGERY Bilateral 12/21/2021   Procedure: MAMMARY REDUCTION  (BREAST) POSSIBLE LIPOSUCTION;  Surgeon: Wallace Going, DO;  Location: [facility];  Service: Plastics;  Laterality: Bilateral;   ROBOTIC ASSISTED DIAGNOSTIC LAPAROSCOPY  09/27/2017   excision of endometriosis, BL ovarian wedge rescetion      OB History     Gravida  3   Para  2   Term  2   Preterm      AB      Living  2      SAB      IAB      Ectopic      Multiple  0   Live Births  2               Current Medications: Current Meds  Medication Sig   acetaminophen (TYLENOL) 500 MG tablet Take 2 tablets (1,000 mg total) by mouth every 6 (six) hours as needed. (Patient taking differently: Take 1,000 mg by mouth every 6 (six) hours as needed for mild pain.)   amLODipine (NORVASC) 5 MG tablet Take 1 tablet (5 mg total) by mouth daily.   famotidine (PEPCID) 20 MG tablet Take 20 mg by mouth 2 (two) times daily as needed for heartburn.   Ferrous Sulfate (IRON PO) Take by mouth.   sertraline (ZOLOFT) 50 MG  tablet Take 1 tablet (50 mg total) by mouth daily.     Allergies:   Imitrex [sumatriptan]   Social History   Socioeconomic History   Marital status: Married    Spouse name: Not on file   Number of children: Not on file   Years of education: Not on file   Highest education level: Not on file  Occupational History   Not on file  Tobacco Use   Smoking status: Never   Smokeless tobacco: Never  Vaping Use   Vaping Use: Never used  Substance and Sexual Activity   Alcohol use: Yes    Alcohol/week: 1.0 standard drink of alcohol    Types: 1 Standard drinks or equivalent per week   Drug use: Never   Sexual activity:  Yes    Birth control/protection: None  Other Topics Concern   Not on file  Social History Narrative   Not on file   Social Determinants of Health   Financial Resource Strain: Not on file  Food Insecurity: Not on file  Transportation Needs: Not on file  Physical Activity: Not on file  Stress: Not on file  Social Connections: Not on file      Family History  Problem Relation Age of Onset   Cancer Mother        non-hodgkins lymphoma   Hyperlipidemia Father    Hypertension Father    Anxiety disorder Sister    Depression Sister    Bipolar disorder Sister    Cancer Maternal Grandmother        hodgkins lymphoma   COPD Paternal Grandmother    Diabetes Paternal Grandmother    Obesity Paternal Grandfather       ROS:   Please see the history of present illness.     All other systems reviewed and are negative.   Labs/EKG Reviewed:    EKG:   EKG is was not ordered today.    Recent Labs: 04/11/2022: TSH 1.640 05/18/2022: BUN 15; Creatinine, Ser 0.70; Potassium 3.7; Sodium 138   Recent Lipid Panel No results found for: "CHOL", "TRIG", "HDL", "CHOLHDL", "LDLCALC", "LDLDIRECT"  Physical Exam:    VS:  BP 126/88   Pulse 96   Ht 5\' 3"  (1.6 m)   Wt 213 lb (96.6 kg)   LMP 04/13/2022 (Exact Date)   SpO2 99%   BMI 37.73 kg/m     Wt Readings from Last 3  Encounters:  06/22/22 213 lb (96.6 kg)  06/14/22 208 lb (94.3 kg)  05/11/22 211 lb (95.7 kg)     GEN:  Well nourished, well developed in no acute distress HEENT: Normal NECK: No JVD; No carotid bruits LYMPHATICS: No lymphadenopathy CARDIAC: RRR, no murmurs, rubs, gallops RESPIRATORY:  Clear to auscultation without rales, wheezing or rhonchi  ABDOMEN: Soft, non-tender, non-distended MUSCULOSKELETAL:  No edema; No deformity  SKIN: Warm and dry NEUROLOGIC:  Alert and oriented x 3 PSYCHIATRIC:  Normal affect    Risk Assessment/Risk Calculators:     CARPREG II Risk Prediction Index Score:  1.  The patient's risk for a primary cardiac event is 5%.            ASSESSMENT & PLAN:    Chronic hypertension pregnancy PCOS OSA-pending CPAP titration Obesity in pregnancy  I will start back her amlodipine 5 mg daily which is an acceptable medication in pregnancy.  Will continue to have the patient take her blood pressure daily.  Anything over 140/90 mmHg she will immediately notify the office.  She will update me every 2 weeks via MyChart with her blood pressure.  Once we know how many weeks pregnant she has by 8 weeks pregnancy we will start her aspirin 81 mg daily for preeclampsia prophylaxis.  Will keep weight gain between 11- 20 pounds  The patient is in agreement with the above plan. The patient left the office in stable condition.  The patient will follow up in 12 weeks or sooner if needed.  Patient Instructions  Medication Instructions:  Your physician has recommended you make the following change in your medication:  START: Amlodipine 5 mg once daily  Please take your blood pressure daily for 2 weeks and send in a MyChart message. Please include heart rates.   HOW TO TAKE YOUR BLOOD PRESSURE: Rest 5 minutes  before taking your blood pressure. Don't smoke or drink caffeinated beverages for at least 30 minutes before. Take your blood pressure before (not after) you  eat. Sit comfortably with your back supported and both feet on the floor (don't cross your legs). Elevate your arm to heart level on a table or a desk. Use the proper sized cuff. It should fit smoothly and snugly around your bare upper arm. There should be enough room to slip a fingertip under the cuff. The bottom edge of the cuff should be 1 inch above the crease of the elbow. Ideally, take 3 measurements at one sitting and record the average.  *If you need a refill on your cardiac medications before your next appointment, please call your pharmacy*   Lab Work: None   Testing/Procedures: None   Follow-Up: At [facility], you and your health needs are our priority.  As part of our continuing mission to provide you with exceptional heart care, we have created designated Provider Care Teams.  These Care Teams include your primary Cardiologist (physician) and Advanced Practice Providers (APPs -  Physician Assistants and Nurse Practitioners) who all work together to provide you with the care you need, when you need it.  Your next appointment:   12 week(s)  Provider:   Berniece Salines, DO     Dispo:  No follow-ups on file.   Medication Adjustments/Labs and Tests Ordered: Current medicines are reviewed at length with the patient today.  Concerns regarding medicines are outlined above.  Tests Ordered: No orders of the defined types were placed in this encounter.  Medication Changes: Meds ordered this encounter  Medications   amLODipine (NORVASC) 5 MG tablet    Sig: Take 1 tablet (5 mg total) by mouth daily.    Dispense:  90 tablet    Refill:  3

## 2022-06-30 ENCOUNTER — Ambulatory Visit: Payer: BC Managed Care – PPO

## 2022-07-04 DIAGNOSIS — Z3201 Encounter for pregnancy test, result positive: Secondary | ICD-10-CM | POA: Diagnosis not present

## 2022-07-06 ENCOUNTER — Encounter: Payer: Self-pay | Admitting: Cardiology

## 2022-07-06 ENCOUNTER — Ambulatory Visit: Payer: BC Managed Care – PPO | Admitting: Student

## 2022-07-11 ENCOUNTER — Ambulatory Visit: Payer: BC Managed Care – PPO | Admitting: Nurse Practitioner

## 2022-07-13 ENCOUNTER — Other Ambulatory Visit: Payer: Self-pay | Admitting: Nurse Practitioner

## 2022-07-18 ENCOUNTER — Ambulatory Visit: Payer: BC Managed Care – PPO | Admitting: Cardiology

## 2022-07-20 ENCOUNTER — Encounter: Payer: Self-pay | Admitting: Cardiology

## 2022-08-08 DIAGNOSIS — Z3689 Encounter for other specified antenatal screening: Secondary | ICD-10-CM | POA: Diagnosis not present

## 2022-08-08 DIAGNOSIS — Z118 Encounter for screening for other infectious and parasitic diseases: Secondary | ICD-10-CM | POA: Diagnosis not present

## 2022-08-08 DIAGNOSIS — O99281 Endocrine, nutritional and metabolic diseases complicating pregnancy, first trimester: Secondary | ICD-10-CM | POA: Diagnosis not present

## 2022-08-08 DIAGNOSIS — Z3A11 11 weeks gestation of pregnancy: Secondary | ICD-10-CM | POA: Diagnosis not present

## 2022-08-08 DIAGNOSIS — Z3480 Encounter for supervision of other normal pregnancy, unspecified trimester: Secondary | ICD-10-CM | POA: Diagnosis not present

## 2022-08-08 DIAGNOSIS — Z124 Encounter for screening for malignant neoplasm of cervix: Secondary | ICD-10-CM | POA: Diagnosis not present

## 2022-09-04 ENCOUNTER — Other Ambulatory Visit: Payer: Self-pay | Admitting: Obstetrics and Gynecology

## 2022-09-04 ENCOUNTER — Encounter: Payer: Self-pay | Admitting: Cardiology

## 2022-09-04 ENCOUNTER — Encounter (HOSPITAL_BASED_OUTPATIENT_CLINIC_OR_DEPARTMENT_OTHER): Payer: Self-pay | Admitting: Obstetrics and Gynecology

## 2022-09-04 DIAGNOSIS — Z8639 Personal history of other endocrine, nutritional and metabolic disease: Secondary | ICD-10-CM | POA: Diagnosis not present

## 2022-09-04 DIAGNOSIS — O021 Missed abortion: Secondary | ICD-10-CM | POA: Diagnosis not present

## 2022-09-04 DIAGNOSIS — Z3A15 15 weeks gestation of pregnancy: Secondary | ICD-10-CM | POA: Diagnosis not present

## 2022-09-04 DIAGNOSIS — Z3689 Encounter for other specified antenatal screening: Secondary | ICD-10-CM | POA: Diagnosis not present

## 2022-09-04 NOTE — Progress Notes (Signed)
Spoke w/ via phone for pre-op interview--- pt Lab needs dos----  [employer] results------ current EKG in epic/ chart COVID test -----patient states asymptomatic no test needed Arrive at ------- 1000 on 09-06-2022 NPO after MN NO Solid Food.  Clear liquids from MN until--- 0900 Med rec completed Medications to take morning of surgery ----- none Diabetic medication ----- n/a Patient instructed no nail polish to be worn day of surgery Patient instructed to bring photo id and insurance card day of surgery Patient aware to have Driver (ride ) / caregiver    for 24 hours after surgery --husband, caleb Patient Special Instructions ----- n/a Pre-Op special Instructions ----- case just added on today, orders pending Patient verbalized understanding of instructions that were given at this phone interview. Patient denies shortness of breath, chest pain, fever, cough at this phone interview.

## 2022-09-05 ENCOUNTER — Other Ambulatory Visit (HOSPITAL_COMMUNITY): Payer: Self-pay | Admitting: Obstetrics and Gynecology

## 2022-09-05 ENCOUNTER — Ambulatory Visit: Payer: Self-pay | Admitting: Cardiology

## 2022-09-05 DIAGNOSIS — O021 Missed abortion: Secondary | ICD-10-CM

## 2022-09-06 ENCOUNTER — Other Ambulatory Visit: Payer: Self-pay

## 2022-09-06 ENCOUNTER — Encounter (HOSPITAL_BASED_OUTPATIENT_CLINIC_OR_DEPARTMENT_OTHER)
Admission: RE | Disposition: A | Discharge status: Home / Self Care | Payer: Self-pay | Source: Home / Self Care | Attending: Obstetrics and Gynecology

## 2022-09-06 ENCOUNTER — Ambulatory Visit (HOSPITAL_BASED_OUTPATIENT_CLINIC_OR_DEPARTMENT_OTHER)
Admission: RE | Admit: 2022-09-06 | Discharge: 2022-09-06 | Disposition: A | Discharge status: Home / Self Care | Payer: BC Managed Care – PPO | Attending: Obstetrics and Gynecology | Admitting: Obstetrics and Gynecology

## 2022-09-06 ENCOUNTER — Ambulatory Visit (HOSPITAL_COMMUNITY)
Admission: RE | Admit: 2022-09-06 | Discharge: 2022-09-06 | Disposition: A | Discharge status: Home / Self Care | Payer: BC Managed Care – PPO | Source: Ambulatory Visit | Attending: Obstetrics and Gynecology | Admitting: Obstetrics and Gynecology

## 2022-09-06 ENCOUNTER — Encounter (HOSPITAL_BASED_OUTPATIENT_CLINIC_OR_DEPARTMENT_OTHER): Payer: Self-pay | Admitting: Obstetrics and Gynecology

## 2022-09-06 ENCOUNTER — Ambulatory Visit (HOSPITAL_BASED_OUTPATIENT_CLINIC_OR_DEPARTMENT_OTHER): Payer: BC Managed Care – PPO | Admitting: Anesthesiology

## 2022-09-06 DIAGNOSIS — E785 Hyperlipidemia, unspecified: Secondary | ICD-10-CM | POA: Insufficient documentation

## 2022-09-06 DIAGNOSIS — Z6836 Body mass index (BMI) 36.0-36.9, adult: Secondary | ICD-10-CM | POA: Insufficient documentation

## 2022-09-06 DIAGNOSIS — O021 Missed abortion: Secondary | ICD-10-CM | POA: Diagnosis not present

## 2022-09-06 DIAGNOSIS — G4733 Obstructive sleep apnea (adult) (pediatric): Secondary | ICD-10-CM | POA: Diagnosis not present

## 2022-09-06 DIAGNOSIS — O99282 Endocrine, nutritional and metabolic diseases complicating pregnancy, second trimester: Secondary | ICD-10-CM | POA: Diagnosis not present

## 2022-09-06 DIAGNOSIS — E282 Polycystic ovarian syndrome: Secondary | ICD-10-CM | POA: Diagnosis not present

## 2022-09-06 DIAGNOSIS — I10 Essential (primary) hypertension: Secondary | ICD-10-CM | POA: Diagnosis not present

## 2022-09-06 DIAGNOSIS — Z01818 Encounter for other preprocedural examination: Secondary | ICD-10-CM

## 2022-09-06 DIAGNOSIS — Z79899 Other long term (current) drug therapy: Secondary | ICD-10-CM | POA: Insufficient documentation

## 2022-09-06 DIAGNOSIS — E669 Obesity, unspecified: Secondary | ICD-10-CM | POA: Insufficient documentation

## 2022-09-06 DIAGNOSIS — E039 Hypothyroidism, unspecified: Secondary | ICD-10-CM | POA: Diagnosis not present

## 2022-09-06 DIAGNOSIS — K219 Gastro-esophageal reflux disease without esophagitis: Secondary | ICD-10-CM | POA: Diagnosis not present

## 2022-09-06 DIAGNOSIS — O162 Unspecified maternal hypertension, second trimester: Secondary | ICD-10-CM | POA: Diagnosis not present

## 2022-09-06 DIAGNOSIS — F419 Anxiety disorder, unspecified: Secondary | ICD-10-CM | POA: Diagnosis not present

## 2022-09-06 DIAGNOSIS — Z23 Encounter for immunization: Secondary | ICD-10-CM | POA: Diagnosis not present

## 2022-09-06 DIAGNOSIS — Z3A15 15 weeks gestation of pregnancy: Secondary | ICD-10-CM | POA: Diagnosis not present

## 2022-09-06 HISTORY — DX: Personal history of other endocrine, nutritional and metabolic disease: Z86.39

## 2022-09-06 HISTORY — PX: OPERATIVE ULTRASOUND: SHX5996

## 2022-09-06 HISTORY — DX: Presence of spectacles and contact lenses: Z97.3

## 2022-09-06 HISTORY — PX: DILATION AND EVACUATION: SHX1459

## 2022-09-06 HISTORY — DX: Other ventricular tachycardia: I47.29

## 2022-09-06 HISTORY — DX: Polycystic ovarian syndrome: E28.2

## 2022-09-06 HISTORY — DX: Prediabetes: R73.03

## 2022-09-06 HISTORY — DX: Obstructive sleep apnea (adult) (pediatric): G47.33

## 2022-09-06 LAB — CBC
HCT: 37.2 % (ref 36.0–46.0)
Hemoglobin: 12.4 g/dL (ref 12.0–15.0)
MCH: 29.2 pg (ref 26.0–34.0)
MCHC: 33.3 g/dL (ref 30.0–36.0)
MCV: 87.5 fL (ref 80.0–100.0)
Platelets: 221 10*3/uL (ref 150–400)
RBC: 4.25 MIL/uL (ref 3.87–5.11)
RDW: 13.9 % (ref 11.5–15.5)
WBC: 8.6 10*3/uL (ref 4.0–10.5)
nRBC: 0 % (ref 0.0–0.2)

## 2022-09-06 LAB — RH IG WORKUP (INCLUDES ABO/RH)
ABO/RH(D): O NEG
Antibody Screen: NEGATIVE
Unit division: 0
Unit tag comment: 12

## 2022-09-06 LAB — POCT I-STAT, CHEM 8
BUN: 6 mg/dL (ref 6–20)
Calcium, Ion: 1.2 mmol/L (ref 1.15–1.40)
Chloride: 103 mmol/L (ref 98–111)
Creatinine, Ser: 0.5 mg/dL (ref 0.44–1.00)
Glucose, Bld: 97 mg/dL (ref 70–99)
HCT: 38 % (ref 36.0–46.0)
Hemoglobin: 12.9 g/dL (ref 12.0–15.0)
Potassium: 4.1 mmol/L (ref 3.5–5.1)
Sodium: 137 mmol/L (ref 135–145)
TCO2: 24 mmol/L (ref 22–32)

## 2022-09-06 SURGERY — DILATION AND EVACUATION, UTERUS, SECOND TRIMESTER
Anesthesia: General | Site: Uterus

## 2022-09-06 MED ORDER — BUPIVACAINE HCL (PF) 0.25 % IJ SOLN
INTRAMUSCULAR | Status: DC | PRN
  Administered 2022-09-06: 20 mL

## 2022-09-06 MED ORDER — ONDANSETRON HCL 4 MG/2ML IJ SOLN
INTRAMUSCULAR | Status: DC | PRN
  Administered 2022-09-06: 4 mg via INTRAVENOUS

## 2022-09-06 MED ORDER — PROPOFOL 10 MG/ML IV BOLUS
INTRAVENOUS | Status: AC
  Filled 2022-09-06: qty 20

## 2022-09-06 MED ORDER — MIDAZOLAM HCL 5 MG/5ML IJ SOLN
INTRAMUSCULAR | Status: DC | PRN
  Administered 2022-09-06: 2 mg via INTRAVENOUS

## 2022-09-06 MED ORDER — CEFAZOLIN SODIUM-DEXTROSE 2-4 GM/100ML-% IV SOLN
2.0000 g | INTRAVENOUS | Status: AC
  Administered 2022-09-06: 2 g via INTRAVENOUS

## 2022-09-06 MED ORDER — 0.9 % SODIUM CHLORIDE (POUR BTL) OPTIME
TOPICAL | Status: DC | PRN
  Administered 2022-09-06: 500 mL

## 2022-09-06 MED ORDER — POVIDONE-IODINE 10 % EX SWAB: 2.0000 | Freq: Once | CUTANEOUS | Status: DC

## 2022-09-06 MED ORDER — DEXAMETHASONE SODIUM PHOSPHATE 10 MG/ML IJ SOLN
INTRAMUSCULAR | Status: DC | PRN
  Administered 2022-09-06: 10 mg via INTRAVENOUS

## 2022-09-06 MED ORDER — FENTANYL CITRATE (PF) 100 MCG/2ML IJ SOLN
INTRAMUSCULAR | Status: DC | PRN
  Administered 2022-09-06 (×2): 50 ug via INTRAVENOUS

## 2022-09-06 MED ORDER — CEFAZOLIN SODIUM-DEXTROSE 2-4 GM/100ML-% IV SOLN
INTRAVENOUS | Status: AC
  Filled 2022-09-06: qty 100

## 2022-09-06 MED ORDER — AMISULPRIDE (ANTIEMETIC) 5 MG/2ML IV SOLN
10.0000 mg | Freq: Once | INTRAVENOUS | Status: AC | PRN
  Administered 2022-09-06: 10 mg via INTRAVENOUS

## 2022-09-06 MED ORDER — FENTANYL CITRATE (PF) 100 MCG/2ML IJ SOLN
INTRAMUSCULAR | Status: AC
  Filled 2022-09-06: qty 2

## 2022-09-06 MED ORDER — LIDOCAINE 2% (20 MG/ML) 5 ML SYRINGE
INTRAMUSCULAR | Status: DC | PRN
  Administered 2022-09-06: 100 mg via INTRAVENOUS

## 2022-09-06 MED ORDER — ACETAMINOPHEN 10 MG/ML IV SOLN: 1000.0000 mg | Freq: Once | INTRAVENOUS | Status: DC | PRN

## 2022-09-06 MED ORDER — WHITE PETROLATUM EX OINT
TOPICAL_OINTMENT | CUTANEOUS | Status: AC
  Filled 2022-09-06: qty 5

## 2022-09-06 MED ORDER — LIDOCAINE HCL (PF) 2 % IJ SOLN
INTRAMUSCULAR | Status: AC
  Filled 2022-09-06: qty 5

## 2022-09-06 MED ORDER — LACTATED RINGERS IV SOLN: INTRAVENOUS | Status: DC

## 2022-09-06 MED ORDER — RHO D IMMUNE GLOBULIN 1500 UNIT/2ML IJ SOSY
300.0000 ug | PREFILLED_SYRINGE | Freq: Once | INTRAMUSCULAR | Status: AC
  Administered 2022-09-06: 300 ug via INTRAVENOUS
  Filled 2022-09-06: qty 2

## 2022-09-06 MED ORDER — OXYCODONE HCL 5 MG PO TABS: 5.0000 mg | ORAL_TABLET | Freq: Once | ORAL | Status: DC | PRN

## 2022-09-06 MED ORDER — MIDAZOLAM HCL 2 MG/2ML IJ SOLN
INTRAMUSCULAR | Status: AC
  Filled 2022-09-06: qty 2

## 2022-09-06 MED ORDER — SUCCINYLCHOLINE CHLORIDE 200 MG/10ML IV SOSY
PREFILLED_SYRINGE | INTRAVENOUS | Status: DC | PRN
  Administered 2022-09-06: 100 mg via INTRAVENOUS

## 2022-09-06 MED ORDER — SUCCINYLCHOLINE CHLORIDE 200 MG/10ML IV SOSY
PREFILLED_SYRINGE | INTRAVENOUS | Status: AC
  Filled 2022-09-06: qty 10

## 2022-09-06 MED ORDER — OXYCODONE HCL 5 MG/5ML PO SOLN: 5.0000 mg | Freq: Once | ORAL | Status: DC | PRN

## 2022-09-06 MED ORDER — AMISULPRIDE (ANTIEMETIC) 5 MG/2ML IV SOLN
INTRAVENOUS | Status: AC
  Filled 2022-09-06: qty 4

## 2022-09-06 MED ORDER — PROPOFOL 10 MG/ML IV BOLUS
INTRAVENOUS | Status: DC | PRN
  Administered 2022-09-06: 200 mg via INTRAVENOUS

## 2022-09-06 MED ORDER — FENTANYL CITRATE (PF) 100 MCG/2ML IJ SOLN: 25.0000 ug | INTRAMUSCULAR | Status: DC | PRN

## 2022-09-06 SURGICAL SUPPLY — 17 items
GLOVE BIO SURGEON STRL SZ7.5 (GLOVE) ×3 IMPLANT
GLOVE BIOGEL PI IND STRL 7.0 (GLOVE) ×3 IMPLANT
GOWN STRL REUS W/ TWL LRG LVL3 (GOWN DISPOSABLE) ×6 IMPLANT
GOWN STRL REUS W/TWL LRG LVL3 (GOWN DISPOSABLE) ×6
HOSE CONNECTING 18IN BERKELEY (TUBING) ×3 IMPLANT
KIT BERKELEY 1ST TRIMESTER 3/8 (MISCELLANEOUS) ×3 IMPLANT
KIT BERKELEY 2ND TRIMESTER 1/2 (COLLECTOR) ×3 IMPLANT
NS IRRIG 1000ML POUR BTL (IV SOLUTION) ×3 IMPLANT
PACK VAGINAL MINOR WOMEN LF (CUSTOM PROCEDURE TRAY) ×3 IMPLANT
PAD OB MATERNITY 4.3X12.25 (PERSONAL CARE ITEMS) ×3 IMPLANT
PAD PREP 24X48 CUFFED NSTRL (MISCELLANEOUS) ×3 IMPLANT
SCRUB CHG 4% DYNA-HEX 4OZ (MISCELLANEOUS) ×3 IMPLANT
TOWEL OR 17X24 6PK STRL BLUE (TOWEL DISPOSABLE) ×6 IMPLANT
TUBE VACURETTE 2ND TRIMESTER (CANNULA) ×3 IMPLANT
VACURETTE 12 RIGID CVD (CANNULA) IMPLANT
VACURETTE 14MM CVD 1/2 BASE (CANNULA) IMPLANT
VACURETTE 16MM ASPIR CVD .5 (CANNULA) IMPLANT

## 2022-09-06 NOTE — Discharge Instructions (Signed)
   D & C Home care Instructions:   Personal hygiene:  Used sanitary napkins for vaginal drainage not tampons. Shower or tub bathe the day after your procedure. No douching until bleeding stops. Always wipe from front to back after  Elimination.  Activity: Do not drive or operate any equipment today. The effects of the anesthesia are still present and drowsiness may result. Rest today, not necessarily flat bed rest, just take it easy. You may resume your normal activity in one to 2 days.  Sexual activity: No intercourse for one week or as indicated by your physician  Diet: Eat a light diet as desired this evening. You may resume a regular diet tomorrow.  Return to work: One to 2 days.  General Expectations of your surgery: Vaginal bleeding should be no heavier than a normal period. Spotting may continue up to 10 days. Mild cramps may continue for a couple of days. You may have a regular period in 2-6 weeks.  Unexpected observations call your doctor if these occur: persistent or heavy bleeding. Severe abdominal cramping or pain. Elevation of temperature greater than 100F.  Call for an appointment in one to four weeks.      Post Anesthesia Home Care Instructions  Activity: Get plenty of rest for the remainder of the day. A responsible individual must stay with you for 24 hours following the procedure.  For the next 24 hours, DO NOT: -Drive a car -Advertising copywriter -Drink alcoholic beverages -Take any medication unless instructed by your physician -Make any legal decisions or sign important papers.  Meals: Start with liquid foods such as gelatin or soup. Progress to regular foods as tolerated. Avoid greasy, spicy, heavy foods. If nausea and/or vomiting occur, drink only clear liquids until the nausea and/or vomiting subsides. Call your physician if vomiting continues.  Special Instructions/Symptoms: Your throat may feel dry or sore from the anesthesia or the breathing tube placed  in your throat during surgery. If this causes discomfort, gargle with warm salt water. The discomfort should disappear within 24 hours.

## 2022-09-06 NOTE — Op Note (Signed)
09/06/2022  12:34 PM  PATIENT:  Angelica Spencer  32 y.o. female  PRE-OPERATIVE DIAGNOSIS:  15 week Missed Abortion  POST-OPERATIVE DIAGNOSIS:  15 week Missed Abortion  PROCEDURE:  Procedure(s): DILATATION AND EVACUATION (D&E) 2ND TRIMESTER OPERATIVE ULTRASOUND CHROMOSOME STUDIES  SURGEON:  Surgeon(s): [name] Mackie, MD  ASSISTANTS: none   ANESTHESIA:   local and general  ESTIMATED BLOOD LOSS: 200cc   DRAINS: none   LOCAL MEDICATIONS USED:  MARCAINE    and Amount: 20 ml  SPECIMEN:  Source of Specimen:  poc  DISPOSITION OF SPECIMEN:  PATHOLOGY  COUNTS:  YES  DICTATION #: [id number]  PLAN OF CARE: dc home  PATIENT DISPOSITION:  PACU - hemodynamically stable.

## 2022-09-06 NOTE — Anesthesia Preprocedure Evaluation (Addendum)
Anesthesia Evaluation  Patient identified by MRN, date of birth, ID band Patient awake    Reviewed: Allergy & Precautions, NPO status , Patient's Chart, lab work & pertinent test results  History of Anesthesia Complications (+) history of anesthetic complications (reports prolonged emergence with surgery for endometriosis but no issues with a subsequent breast reduction)  Airway Mallampati: III  TM Distance: >3 FB Neck ROM: Full   Comment: Previous grade II view with Miller 2, easy mask Dental  (+) Dental Advisory Given Permanent retainer on the top and bottom:   Pulmonary neg shortness of breath, sleep apnea (recent diagnosis, has not received CPAP yet) , neg COPD, neg recent URI   Pulmonary exam normal breath sounds clear to auscultation       Cardiovascular hypertension, (-) angina (-) Past MI, (-) Cardiac Stents and (-) CABG + dysrhythmias (NSVT)  Rhythm:Regular Rate:Normal  HLD  TTE 05/03/2022: IMPRESSIONS     1. Left ventricular ejection fraction, by estimation, is 60 to 65%. The  left ventricle has normal function. The left ventricle has no regional  wall motion abnormalities. Left ventricular diastolic parameters were  normal. The average left ventricular  global longitudinal strain is -17.9 %. The global longitudinal strain is  normal.   2. Right ventricular systolic function is normal. The right ventricular  size is normal.   3. The mitral valve is normal in structure. No evidence of mitral valve  regurgitation. No evidence of mitral stenosis.   4. The aortic valve is tricuspid. Aortic valve regurgitation is not  visualized. No aortic stenosis is present.   5. The inferior vena cava is normal in size with greater than 50%  respiratory variability, suggesting right atrial pressure of 3 mmHg.     Neuro/Psych  Headaches, neg Seizures PSYCHIATRIC DISORDERS Anxiety Depression       GI/Hepatic Neg liver ROS,GERD   Medicated,,  Endo/Other  Hypothyroidism (no medications)  hyperaldosteronism  Renal/GU negative Renal ROS     Musculoskeletal   Abdominal  (+) + obese  Peds  Hematology negative hematology ROS (+)   Anesthesia Other Findings 15 week missed abortion  Reproductive/Obstetrics PCOS, endometriosis                             Anesthesia Physical Anesthesia Plan  ASA: 3  Anesthesia Plan: General   Post-op Pain Management:    Induction: Intravenous and Rapid sequence  PONV Risk Score and Plan: 3 and Ondansetron, Dexamethasone and Treatment may vary due to age or medical condition  Airway Management Planned: Oral ETT  Additional Equipment:   Intra-op Plan:   Post-operative Plan: Extubation in OR  Informed Consent: I have reviewed the patients History and Physical, chart, labs and discussed the procedure including the risks, benefits and alternatives for the proposed anesthesia with the patient or authorized representative who has indicated his/her understanding and acceptance.     Dental advisory given  Plan Discussed with: CRNA and Anesthesiologist  Anesthesia Plan Comments: (Risks of general anesthesia discussed including, but not limited to, sore throat, hoarse voice, chipped/damaged teeth, injury to vocal cords, nausea and vomiting, allergic reactions, lung infection, heart attack, stroke, and death. All questions answered. )        Anesthesia Quick Evaluation

## 2022-09-06 NOTE — Anesthesia Postprocedure Evaluation (Signed)
Anesthesia Post Note  Patient: Celeta Eicher  Procedure(s) Performed: DILATATION AND EVACUATION (D&E) 2ND TRIMESTER (Uterus) OPERATIVE ULTRASOUND (Abdomen) CHROMOSOME STUDIES (Uterus)     Patient location during evaluation: PACU Anesthesia Type: General Level of consciousness: awake Pain management: pain level controlled Vital Signs Assessment: post-procedure vital signs reviewed and stable Respiratory status: spontaneous breathing, nonlabored ventilation and respiratory function stable Cardiovascular status: blood pressure returned to baseline and stable Postop Assessment: no apparent nausea or vomiting Anesthetic complications: no   No notable events documented.  Last Vitals:  Vitals:   09/06/22 1300 09/06/22 1315  BP: 123/85 124/76  Pulse: 84 75  Resp: 18 17  Temp:    SpO2: 95% 97%    Last Pain:  Vitals:   09/06/22 1241  TempSrc:   PainSc: 0-No pain                 Linton Rump

## 2022-09-06 NOTE — Anesthesia Procedure Notes (Signed)
Procedure Name: Intubation Date/Time: 09/06/2022 12:08 PM  Performed by: Bishop Limbo, CRNAPre-anesthesia Checklist: Patient identified, Emergency Drugs available, Suction available and Patient being monitored Patient Re-evaluated:Patient Re-evaluated prior to induction Oxygen Delivery Method: Circle System Utilized Preoxygenation: Pre-oxygenation with 100% oxygen Induction Type: IV induction, Rapid sequence and Cricoid Pressure applied Laryngoscope Size: Mac and 3 Grade View: Grade I Tube type: Oral Tube size: 7.0 mm Number of attempts: 1 Airway Equipment and Method: Stylet Placement Confirmation: ETT inserted through vocal cords under direct vision, positive ETCO2 and breath sounds checked- equal and bilateral Secured at: 22 cm Tube secured with: Tape Dental Injury: Teeth and Oropharynx as per pre-operative assessment

## 2022-09-06 NOTE — Transfer of Care (Signed)
Immediate Anesthesia Transfer of Care Note  Patient: [name]  Procedure(s) Performed: DILATATION AND EVACUATION (D&E) 2ND TRIMESTER (Uterus) OPERATIVE ULTRASOUND (Abdomen) CHROMOSOME STUDIES (Uterus)  Patient Location: PACU  Anesthesia Type:General  Level of Consciousness: awake, alert , oriented, and patient cooperative  Airway & Oxygen Therapy: Patient Spontanous Breathing  Post-op Assessment: Report given to RN and Post -op Vital signs reviewed and stable  Post vital signs: Reviewed and stable  Last Vitals:  Vitals Value Taken Time  BP 123/95 09/06/22 1245  Temp 36.8 C 09/06/22 1241  Pulse 93 09/06/22 1247  Resp 18 09/06/22 1247  SpO2 95 % 09/06/22 1247  Vitals shown include unvalidated device data.  Last Pain:  Vitals:   09/06/22 1241  TempSrc:   PainSc: 0-No pain      Patients Stated Pain Goal: 3 (09/06/22 0931)  Complications: No notable events documented.

## 2022-09-06 NOTE — Op Note (Unsigned)
NAME: Angelica Spencer, Angelica Spencer MEDICAL RECORD NO: [id number] ACCOUNT NO: [id number] DATE OF BIRTH: 12-02-1990 FACILITY: [facility] LOCATION: [facility] PHYSICIAN: Lenoard Aden, MD  Operative Report   DATE OF PROCEDURE: 09/06/2022  PREOPERATIVE DIAGNOSIS:  15-week intrauterine fetal demise.  POSTOPERATIVE DIAGNOSIS:  15-week intrauterine fetal demise.  PROCEDURE:  Suction D and E under ultrasound guidance with operative ultrasound with tissue sent for Anora chromosomal studies.  SURGEON:  Lenoard Aden, MD  ASSISTANT:  None.  ANESTHESIA:  Local, general.  ESTIMATED BLOOD LOSS:  200 mL  COMPLICATIONS:  None.  DRAINS:  None.  COUNTS:  Correct.  DISPOSITION:  The patient recovery in good condition.  BRIEF OPERATIVE NOTE:  After being apprised of the risks of anesthesia, infection, bleeding, injury to surrounding organs, possible need for repair and delayed versus immediate complications including bowel and bladder injury, possible need for repair,  heavy bleeding and possible need for blood transfusion.  The patient was brought to the operating room.  She was administered general anesthetic without complications.  Prepped and draped in usual sterile fashion, catheterized until the bladder was empty.  Exam under anesthesia reveals a 12-14 week size  uterus.  No adnexal masses.  Ultrasound placed.  No fetal heart tones were confirmed prior to the procedure having already been done in the office, normal cell-free DNA studies had previously been documented in the chart. At this time cervix was grasped  using a single tooth tenaculum.  Dilute Marcaine solution placed.  Standard paracervical block of 20 mL total.  Cervix easily dilated up to a #42 Pratt dilator.  A 14 mm suction curette placed, products of conception are aspirated using suction and long  forceps were used to extract fetal parts as well.  Repeat suction and blunt curettage revealed the cavity to be empty.  Estimated blood loss was  noted.  Good hemostasis noted.  All instruments were removed.  Ultrasound confirmed empty appearing  endometrial cavity.  There are some subendometrial vascularity, which appears subendometrial and there is no evidence of endometrial tissue at the time of the procedure with minimal clinical bleeding noted. At this time procedure was terminated.  The  patient tolerated the procedure well, was awakened and transferred to recovery in good condition.   PUS D: 09/06/2022 12:40:43 pm T: 09/06/2022 1:04:00 pm  JOB: [id number]/ [id number]

## 2022-09-06 NOTE — H&P (Signed)
Angelica Spencer is an 32 y.o. female. Sono guided DE for second trimester loss. Nl nips.   Pertinent Gynecological History: Menses: flow is moderate Bleeding: na Contraception: none DES exposure: denies Blood transfusions: none Sexually transmitted diseases: no past history Previous GYN Procedures:  na   Last mammogram:  na  Date: na Last pap: normal Date: 2022 OB History: G3, P2   Menstrual History: Menarche age: 25 Patient's last menstrual period was 04/13/2022 (exact date).    Past Medical History:  Diagnosis Date   Anxiety    Depression    Endometriosis    GERD (gastroesophageal reflux disease)    History of hypothyroidism    per pt dx 2019 meds until 2020, no meds since , normalized   Hyperlipidemia    Hypertension    dr c. schumann/  dr Kirtland Bouchard. [facility]   Migraines    NSVT (nonsustained ventricular tachycardia) [facility])    cardiologist--- dr [place]. schumann   OSA (obstructive sleep apnea)    followed by dr t. turner;   study done in epic 02-02-20245 moderate , (09-04-2022  per pt did get titrated for cpap but now waiting on medical supply company)   PCOS (polycystic ovarian syndrome)    Pre-diabetes    Wears contact lenses     Past Surgical History:  Procedure Laterality Date   BREAST REDUCTION SURGERY Bilateral 12/21/2021   Procedure: MAMMARY REDUCTION  (BREAST) POSSIBLE LIPOSUCTION;  Surgeon: Peggye Form, DO;  Location: [facility];  Service: Plastics;  Laterality: Bilateral;   ROBOTIC ASSISTED DIAGNOSTIC LAPAROSCOPY  09/27/2017   @ [facility] by dr a. simon;  EXCISION ENDOMETRIOSIS/ BILATERAL OVARIAN WEDGE RESECTION/  CHROMPERTUBATION/  HYSTEROSCOPIC UTERINE SEPTUM RESECTION W/ VERSAPOINT    Family History  Problem Relation Age of Onset   Cancer Mother        non-hodgkins lymphoma   Hyperlipidemia Father    Hypertension Father    Anxiety disorder Sister    Depression Sister    Bipolar disorder Sister    Cancer Maternal Grandmother        hodgkins  lymphoma   COPD Paternal Grandmother    Diabetes Paternal Grandmother    Obesity Paternal Grandfather     Social History:  reports that she has never smoked. She has never used smokeless tobacco. She reports that she does not currently use alcohol. She reports that she does not use drugs.  Allergies:  Allergies  Allergen Reactions   Imitrex [Sumatriptan]     Pt reports heart palpitations     Medications Prior to Admission  Medication Sig Dispense Refill Last Dose   acetaminophen (TYLENOL) 500 MG tablet Take 1,000 mg by mouth every 6 (six) hours as needed.   09/04/2022   amLODipine (NORVASC) 5 MG tablet Take 1 tablet (5 mg total) by mouth daily. (Patient taking differently: Take 5 mg by mouth at bedtime.) 90 tablet 3 09/05/2022   aspirin EC 81 MG tablet Take 81 mg by mouth daily. Swallow whole.   09/05/2022   famotidine (PEPCID) 20 MG tablet Take 20 mg by mouth at bedtime.   09/05/2022   sertraline (ZOLOFT) 50 MG tablet TAKE 1 TABLET(50 MG) BY MOUTH DAILY (Patient taking differently: Take 50 mg by mouth at bedtime.) 90 tablet 1 09/05/2022    Review of Systems  Constitutional: Negative.   All other systems reviewed and are negative.   Blood pressure 136/87, pulse 84, temperature 97.8 F (36.6 C), temperature source Oral, resp. rate 18, height 5\' 3"  (  1.6 m), weight 93.2 kg, last menstrual period 04/13/2022, SpO2 97 %, not currently breastfeeding. Physical Exam Constitutional:      Appearance: Normal appearance. She is normal weight.  HENT:     Head: Normocephalic.  Cardiovascular:     Rate and Rhythm: Normal rate and regular rhythm.     Pulses: Normal pulses.     Heart sounds: Normal heart sounds.  Pulmonary:     Effort: Pulmonary effort is normal.     Breath sounds: Normal breath sounds.  Genitourinary:    General: Normal vulva.  Musculoskeletal:        General: Normal range of motion.     Cervical back: Normal range of motion and neck supple.  Skin:    General: Skin is warm.   Neurological:     General: No focal deficit present.     Mental Status: She is alert.     Results for orders placed or performed during the hospital encounter of 09/06/22 (from the past 24 hour(s))  CBC     Status: None   Collection Time: 09/06/22  9:50 AM  Result Value Ref Range   WBC 8.6 4.0 - 10.5 K/uL   RBC 4.25 3.87 - 5.11 MIL/uL   Hemoglobin 12.4 12.0 - 15.0 g/dL   HCT 16.1 09.6 - 04.5 %   MCV 87.5 80.0 - 100.0 fL   MCH 29.2 26.0 - 34.0 pg   MCHC 33.3 30.0 - 36.0 g/dL   RDW 40.9 81.1 - 91.4 %   Platelets 221 150 - 400 K/uL   nRBC 0.0 0.0 - 0.2 %  Type and screen     Status: None (Preliminary result)   Collection Time: 09/06/22  9:50 AM  Result Value Ref Range   ABO/RH(D) PENDING    Antibody Screen PENDING    Sample Expiration      09/09/2022,2359 Performed at [facility], [address], [place], [place] [address]   I-STAT, [place] [address]     Status: None   Collection Time: 09/06/22  9:54 AM  Result Value Ref Range   Sodium 137 135 - 145 mmol/L   Potassium 4.1 3.5 - 5.1 mmol/L   Chloride 103 98 - 111 mmol/L   BUN 6 6 - 20 mg/dL   Creatinine, Ser 6.21 0.44 - 1.00 mg/dL   Glucose, Bld 97 70 - 99 mg/dL   Calcium, Ion 3.08 1.15 - 1.40 mmol/L   TCO2 24 22 - 32 mmol/L   Hemoglobin 12.9 12.0 - 15.0 g/dL   HCT 65.7 84.6 - 96.2 %    No results found.  Assessment/Plan: 15 wk iUp with 12 w fetal demise Nl nips Sono guided DE. Surgical risks noted. Consent done. Anora testing.   Angelica Spencer J 09/06/2022, 11:51 AM

## 2022-09-07 ENCOUNTER — Encounter (HOSPITAL_BASED_OUTPATIENT_CLINIC_OR_DEPARTMENT_OTHER): Payer: Self-pay | Admitting: Obstetrics and Gynecology

## 2022-09-07 LAB — RH IG WORKUP (INCLUDES ABO/RH): Gestational Age(Wks): 12

## 2022-09-07 LAB — TYPE AND SCREEN
ABO/RH(D): O NEG
Antibody Screen: NEGATIVE

## 2022-09-07 LAB — SURGICAL PATHOLOGY

## 2022-09-09 ENCOUNTER — Other Ambulatory Visit: Payer: Self-pay

## 2022-09-13 LAB — ANORA MISCARRIAGE TEST - FRESH

## 2022-10-04 DIAGNOSIS — O021 Missed abortion: Secondary | ICD-10-CM | POA: Diagnosis not present

## 2022-10-04 DIAGNOSIS — F4321 Adjustment disorder with depressed mood: Secondary | ICD-10-CM | POA: Diagnosis not present

## 2022-10-04 DIAGNOSIS — Z4889 Encounter for other specified surgical aftercare: Secondary | ICD-10-CM | POA: Diagnosis not present

## 2022-10-18 IMAGING — CT CT ABD-PELV W/ CM
2 of 4 series · 15 of 46 positions shown, 17 images · IV contrast (omnipaque)
Comparison: None.

CLINICAL DATA: Fever, cough, chest pain, left flank pain, evaluate
for PE

EXAM:
CT ANGIOGRAPHY CHEST
CT ABDOMEN AND PELVIS WITH CONTRAST
TECHNIQUE: Multidetector CT imaging of the chest was performed using the
standard protocol during bolus administration of intravenous
contrast. Multiplanar CT image reconstructions and MIPs were
obtained to evaluate the vascular anatomy. Multidetector CT imaging
of the abdomen and pelvis was performed using the standard protocol
during bolus administration of intravenous contrast.
CONTRAST:  100mL OMNIPAQUE IOHEXOL 350 MG/ML SOLN

[Series 4: axial st · axial · 0.83mm/px · z∈[-613,-163]mm · 12 of 104 slices shown, 14 images]
[im 7/104  soft-tissue]
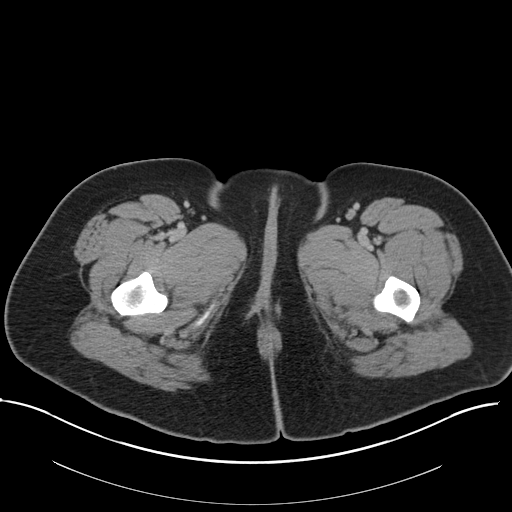
[im 7/104  bone]
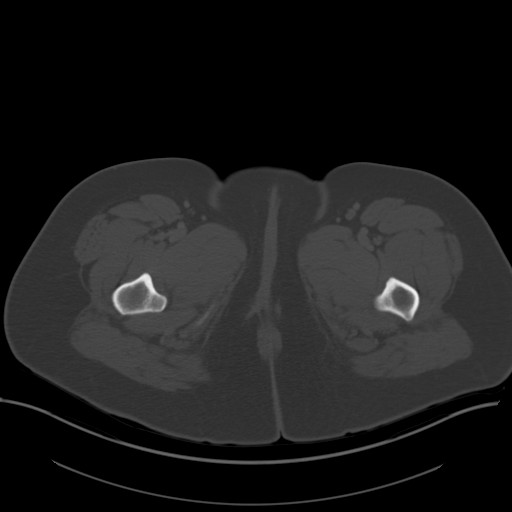
[im 19/104  soft-tissue]
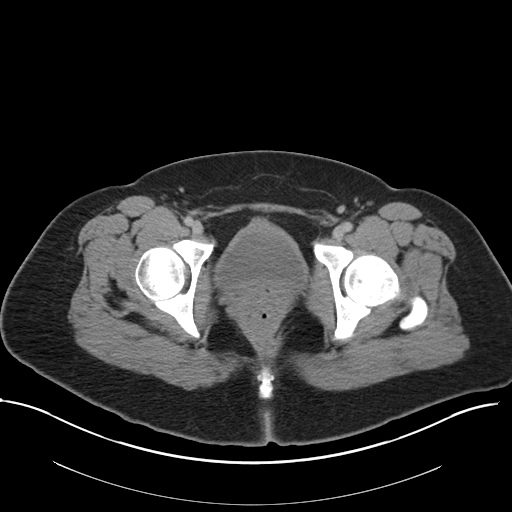
[im 25/104  soft-tissue]
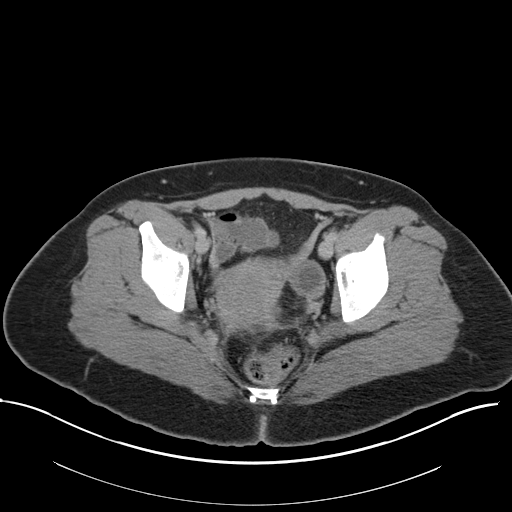
[im 31/104  soft-tissue]
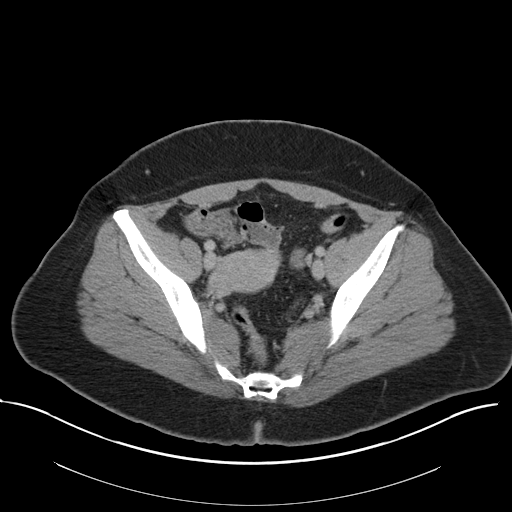
[im 43/104  soft-tissue]
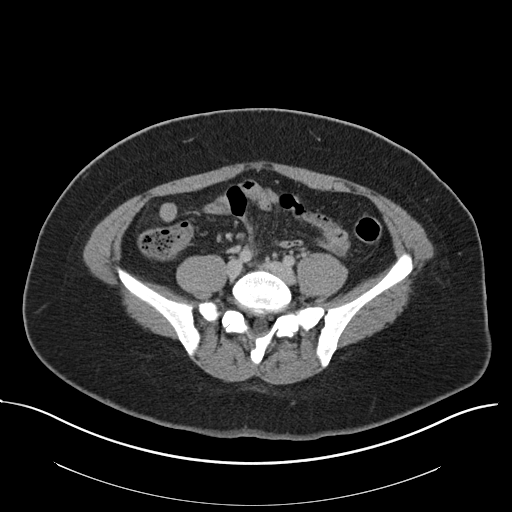
[im 49/104  soft-tissue]
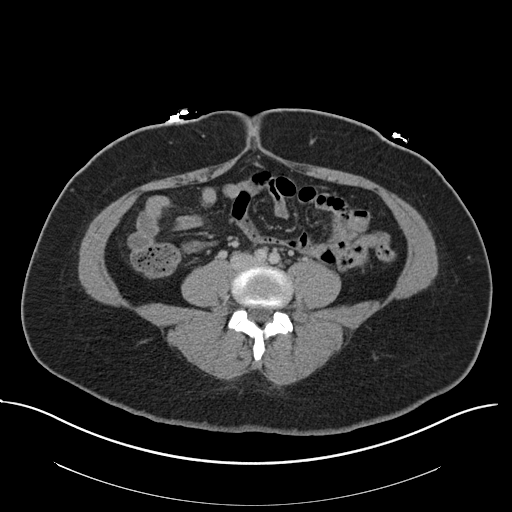
[im 55/104  soft-tissue]
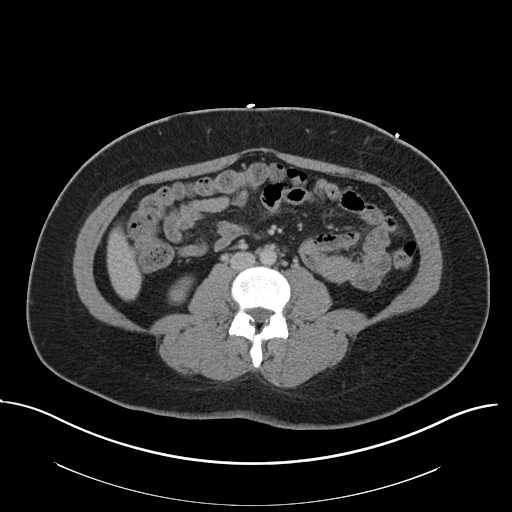
[im 67/104  soft-tissue]
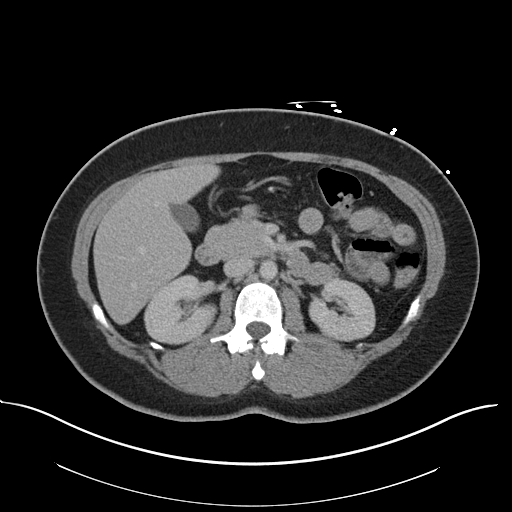
[im 73/104  soft-tissue]
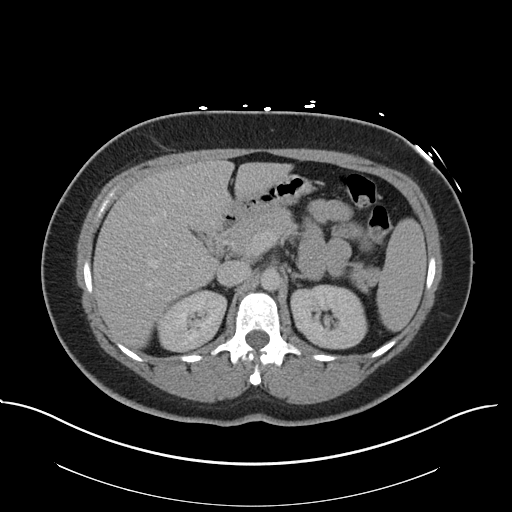
[im 73/104  bone]
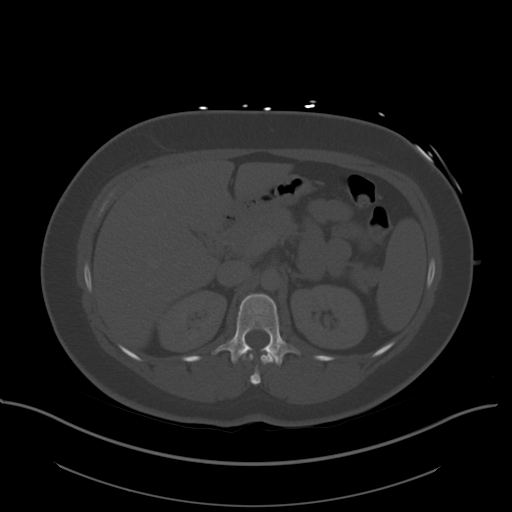
[im 79/104  soft-tissue]
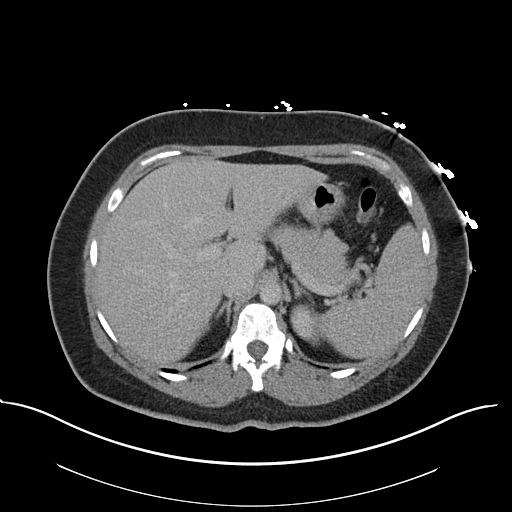
[im 91/104  soft-tissue]
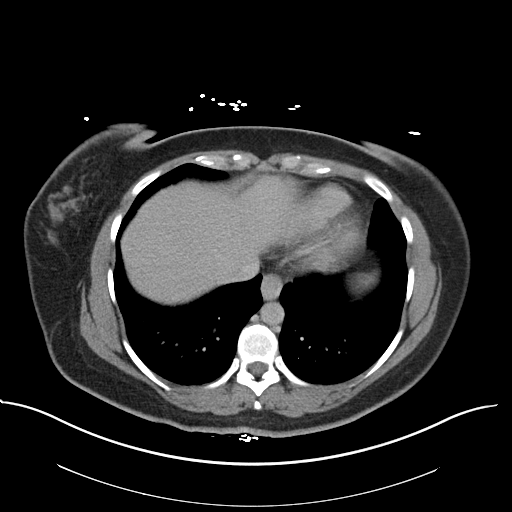
[im 97/104  soft-tissue]
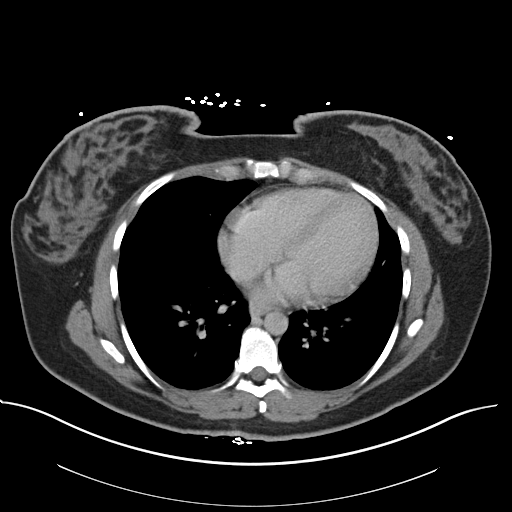

[Series 6: coronal st · coronal · 0.87mm/px · 3 of 87 slices shown]
[im 29/87  soft-tissue]
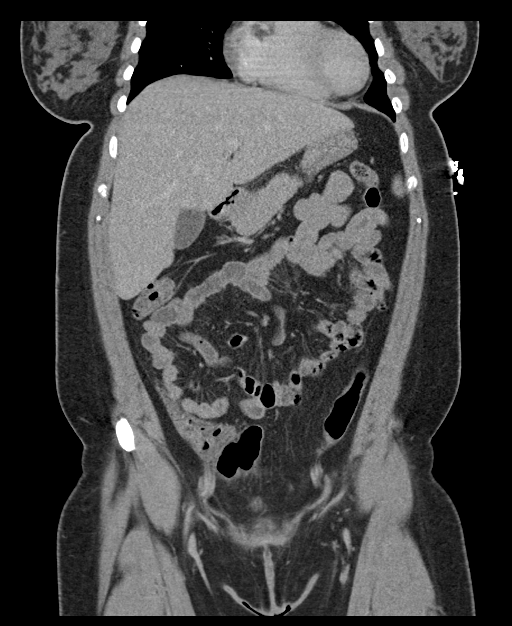
[im 39/87  soft-tissue]
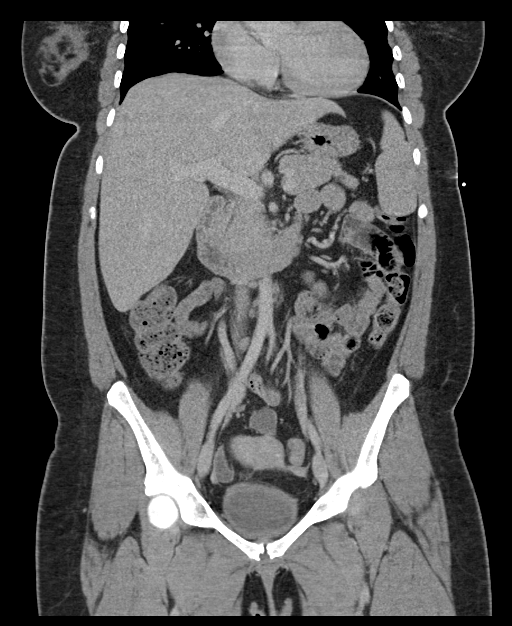
[im 48/87  soft-tissue]
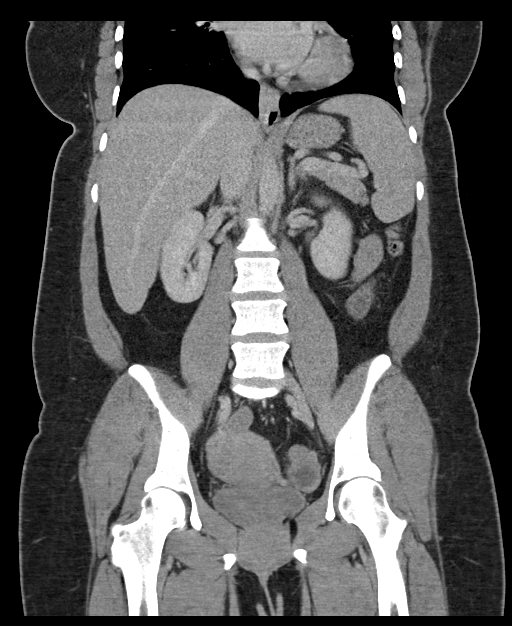

[15 of 46 positions shown; findings below may reference images not displayed]

FINDINGS: CTA CHEST FINDINGS

Cardiovascular: Suboptimal opacification of the bilateral pulmonary
arteries due to bolus timing and respiratory motion. No evidence of
central pulmonary embolism.

Although not tailored for evaluation of the thoracic aorta, there is
no evidence of thoracic aortic aneurysm or dissection.

The heart is normal in size.

Mediastinum/Nodes: No suspicious mediastinal lymphadenopathy.

Visualized thyroid is unremarkable.

Lungs/Pleura: Evaluation of the lung parenchyma is constrained by
respiratory motion.

Within that constraint, there are no suspicious pulmonary nodules.

No focal consolidation.

No pleural effusion or pneumothorax.

Musculoskeletal: Mild degenerative changes of the midthoracic spine.

Review of the MIP images confirms the above findings.

CT ABDOMEN and PELVIS FINDINGS

Hepatobiliary: Liver is within normal limits.

Gallbladder is unremarkable. No intrahepatic or extrahepatic ductal
dilatation.

Pancreas: Within normal limits.

Spleen: Within normal limits.

Adrenals/Urinary Tract: Adrenal glands are within normal limits.

Kidneys are within normal limits.  No hydronephrosis.

Thick-walled bladder, although underdistended.

Stomach/Bowel: Stomach is within normal limits.

No evidence of bowel obstruction.

Normal appendix (series 4/image 66).

No colonic wall thickening or inflammatory changes.

Vascular/Lymphatic: No evidence of abdominal aortic aneurysm.

No suspicious abdominopelvic lymphadenopathy.

Reproductive: Uterus and bilateral ovaries are within normal limits,
noting a 2.7 cm left corpus luteum, physiologic.

Other: No abdominopelvic ascites.

Musculoskeletal: Visualized osseous structures are within normal
limits.

Review of the MIP images confirms the above findings.
IMPRESSION: No evidence of central pulmonary embolism.

No evidence of acute cardiopulmonary disease.

Negative CT abdomen/pelvis.

## 2022-10-19 IMAGING — US US TRANSVAGINAL NON-OB
1 series · 15 of 25 positions shown · non-contrast
Comparison: CT abdomen/pelvis dated 12/28/2020

CLINICAL DATA: Left lower quadrant abdominal pain x1 day

EXAM:
TRANSABDOMINAL AND TRANSVAGINAL ULTRASOUND OF PELVIS
DOPPLER ULTRASOUND OF OVARIES
TECHNIQUE: Both transabdominal and transvaginal ultrasound examinations of the
pelvis were performed. Transabdominal technique was performed for
global imaging of the pelvis including uterus, ovaries, adnexal
regions, and pelvic cul-de-sac.
It was necessary to proceed with endovaginal exam following the
transabdominal exam to visualize the right ovary. Color and duplex
Doppler ultrasound was utilized to evaluate blood flow to the
ovaries.

[Series 1: us transvaginal non-ob · 15 of 82 slices shown]
[im 1/82]
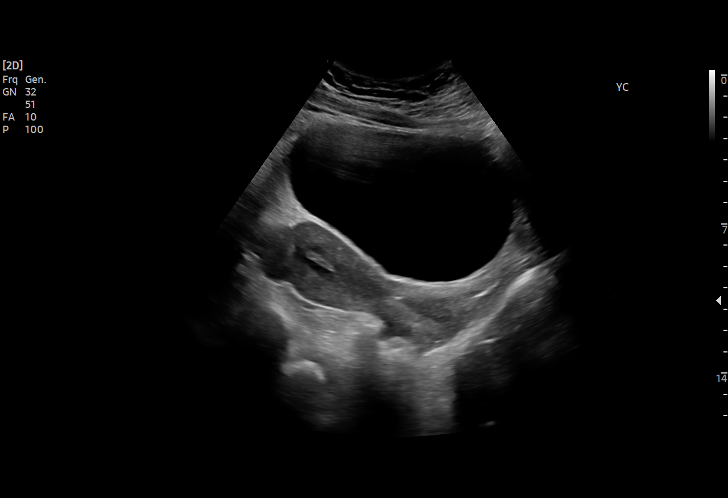
[im 7/82]
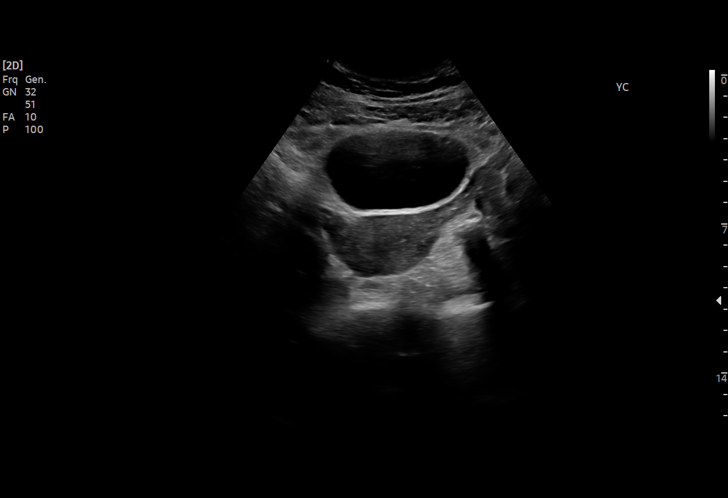
[im 14/82]
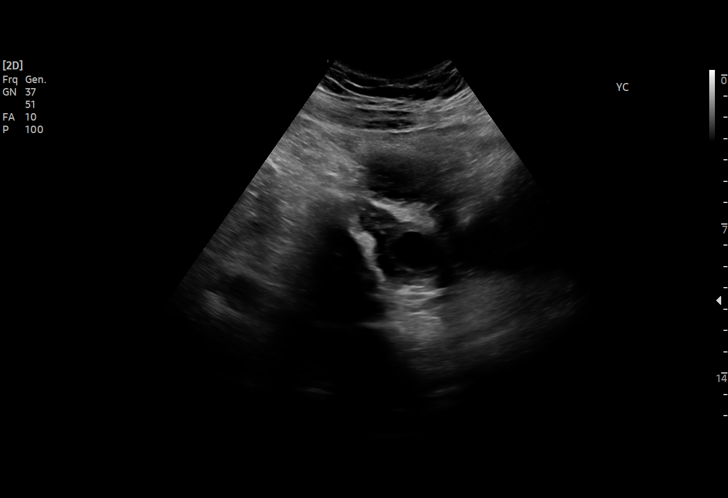
[im 17/82]
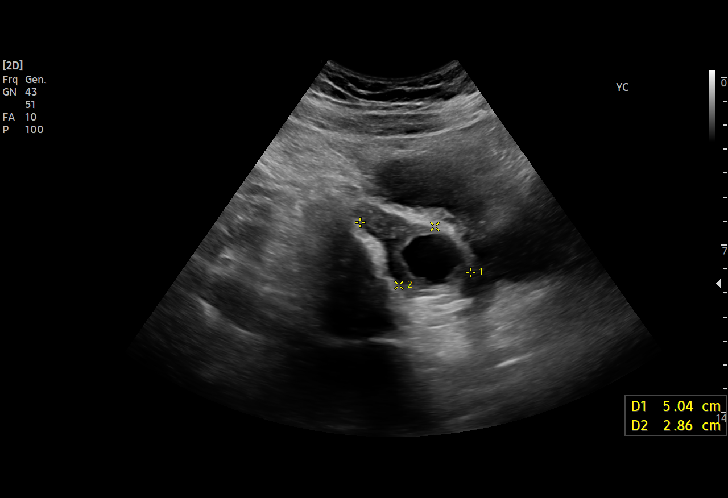
[im 24/82]
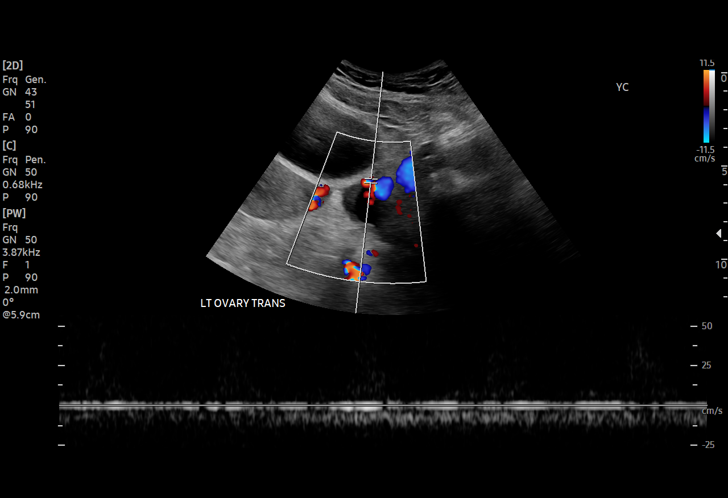
[im 31/82]
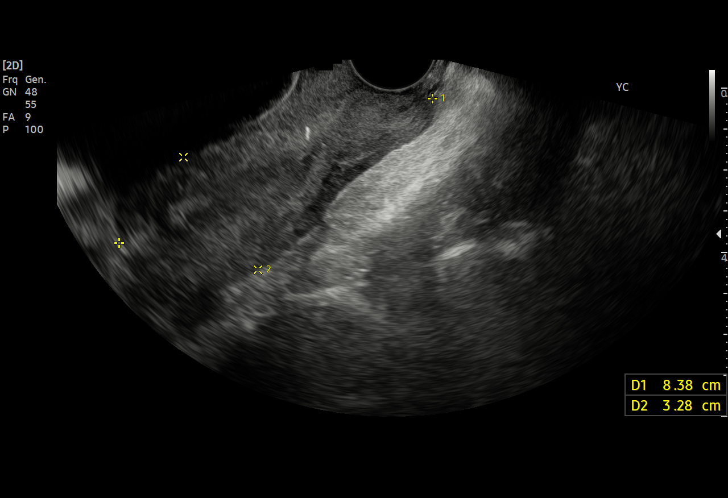
[im 34/82]
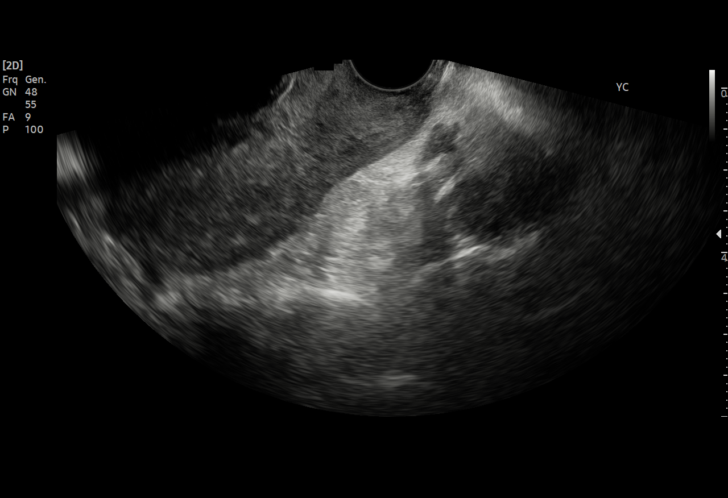
[im 41/82]
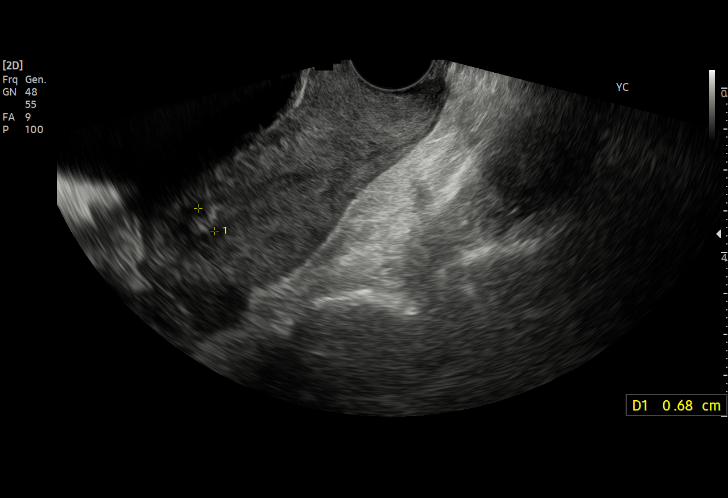
[im 48/82]
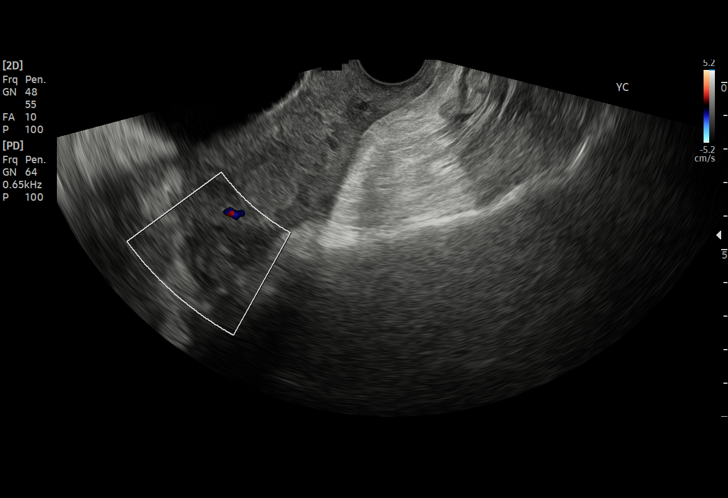
[im 51/82]
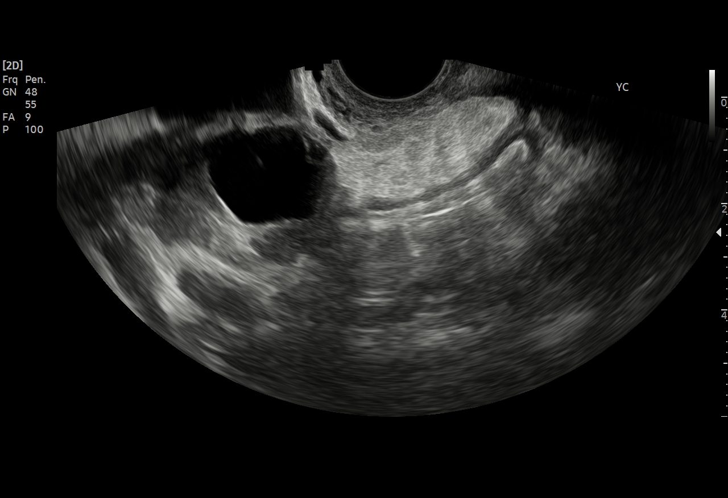
[im 58/82]
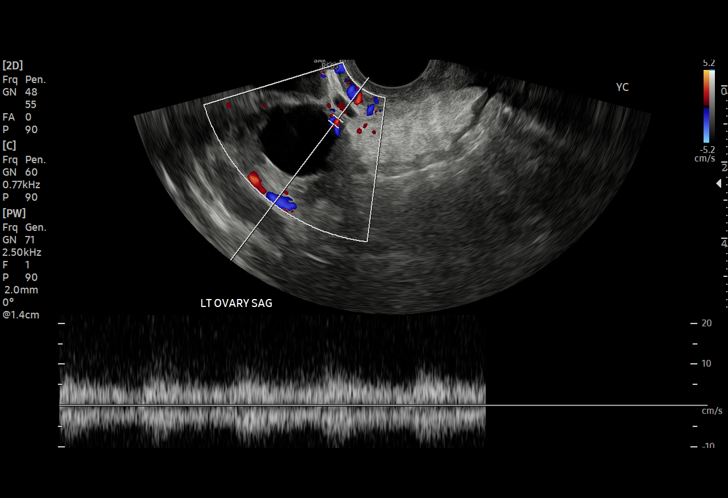
[im 65/82]
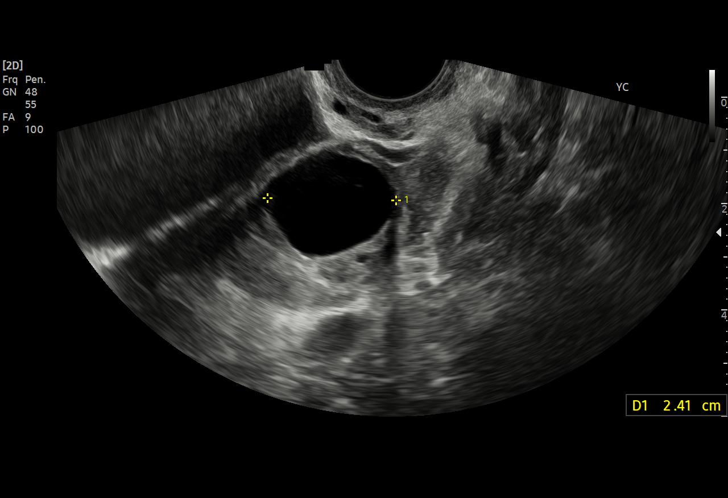
[im 68/82]
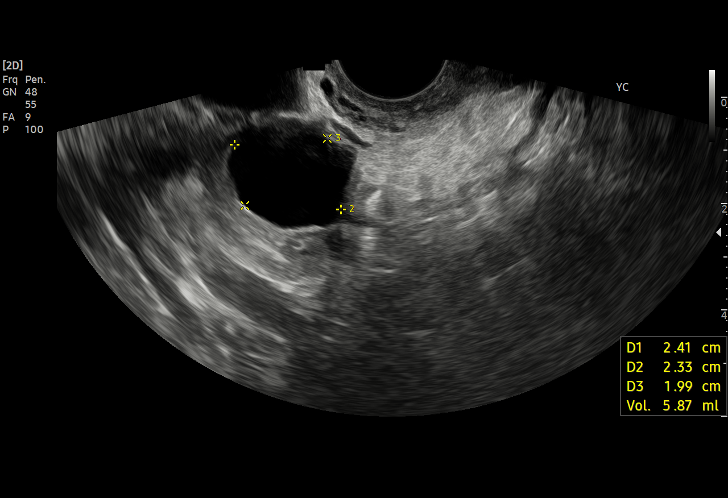
[im 75/82]
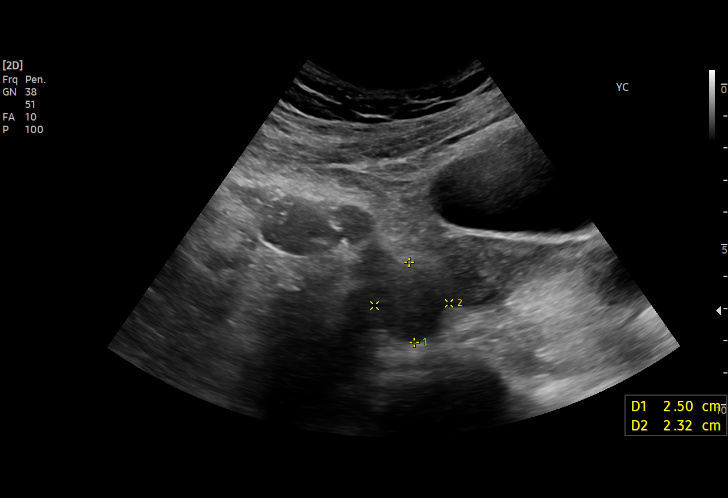
[im 82/82]
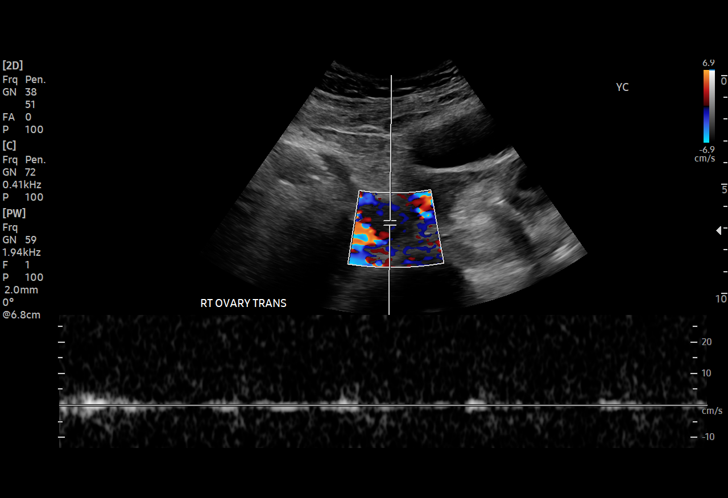

[15 of 25 positions shown; findings below may reference images not displayed]

FINDINGS: Uterus

Measurements: 9.5 x 3.3 x 6.0 cm = volume: 97 mL. No fibroids or
other mass visualized.

Endometrium

Thickness: 7 mm.  No focal abnormality visualized.

Right ovary

Measurements: 2.5 x 2.3 x 2.5 cm = volume: 7.5 mL. Normal
appearance/no adnexal mass.

Left ovary

Measurements: 3.5 x 2.8 x 3.3 cm = volume: 17.0 mL. 2.4 x 2.0 x
cm simple corpus luteum, benign/physiologic.

Pulsed Doppler evaluation of both ovaries demonstrates normal
low-resistance arterial and venous waveforms.

Other findings

No abnormal free fluid.
IMPRESSION: 2.4 cm simple left corpus luteum, benign/physiologic. No follow-up
recommended.

Negative pelvic ultrasound.

No evidence of ovarian torsion.

## 2022-10-19 IMAGING — US US PELVIS COMPLETE
1 series · 15 of 25 positions shown · non-contrast
Comparison: CT abdomen/pelvis dated 12/28/2020

CLINICAL DATA: Left lower quadrant abdominal pain x1 day

EXAM:
TRANSABDOMINAL AND TRANSVAGINAL ULTRASOUND OF PELVIS
DOPPLER ULTRASOUND OF OVARIES
TECHNIQUE: Both transabdominal and transvaginal ultrasound examinations of the
pelvis were performed. Transabdominal technique was performed for
global imaging of the pelvis including uterus, ovaries, adnexal
regions, and pelvic cul-de-sac.
It was necessary to proceed with endovaginal exam following the
transabdominal exam to visualize the right ovary. Color and duplex
Doppler ultrasound was utilized to evaluate blood flow to the
ovaries.

[Series 1: us pelvis complete · 15 of 82 slices shown]
[im 1/82]
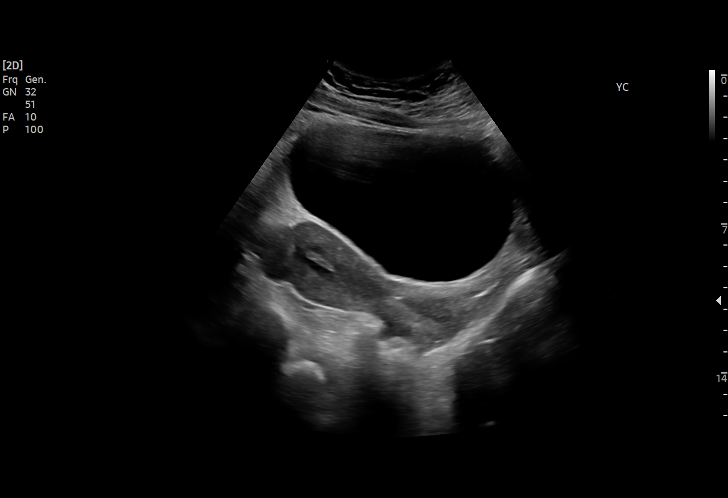
[im 7/82]
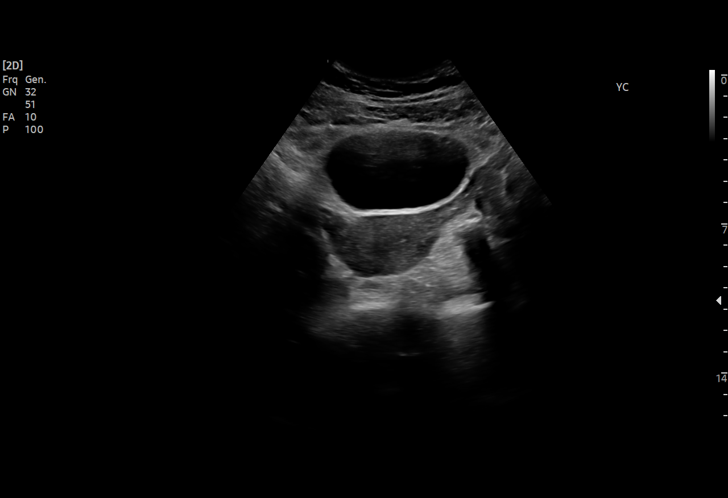
[im 14/82]
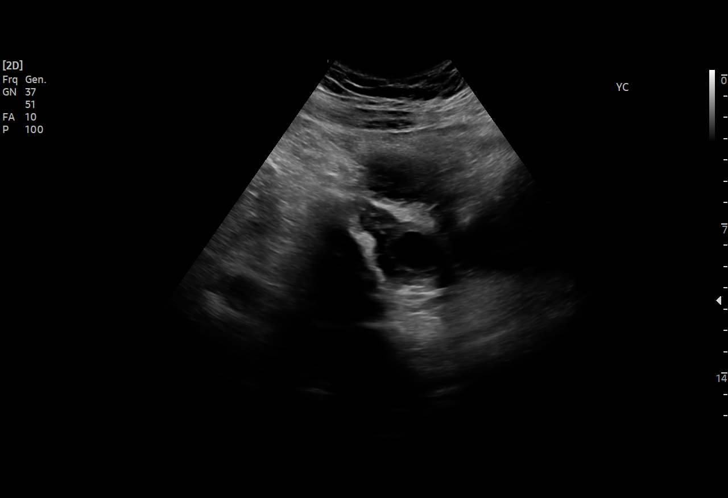
[im 17/82]
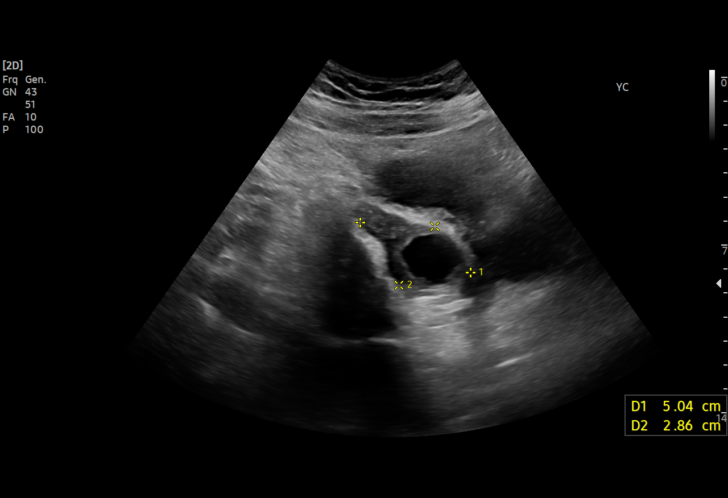
[im 24/82]
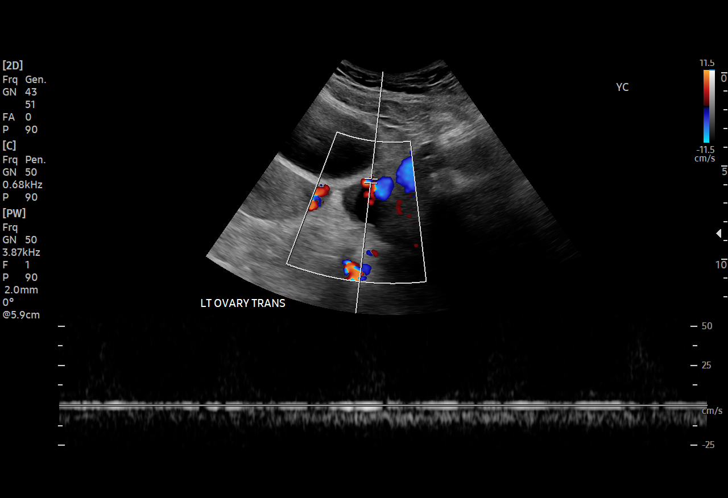
[im 31/82]
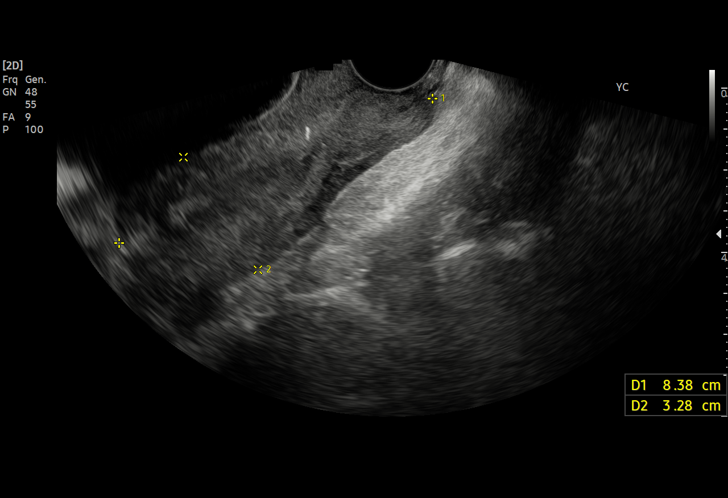
[im 34/82]
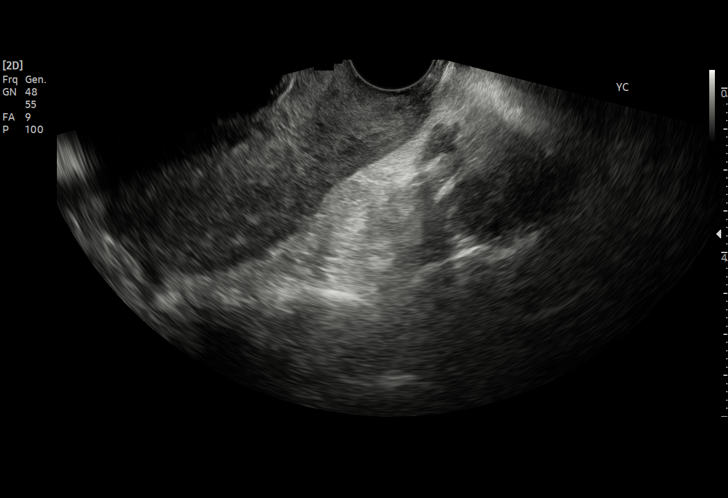
[im 41/82]
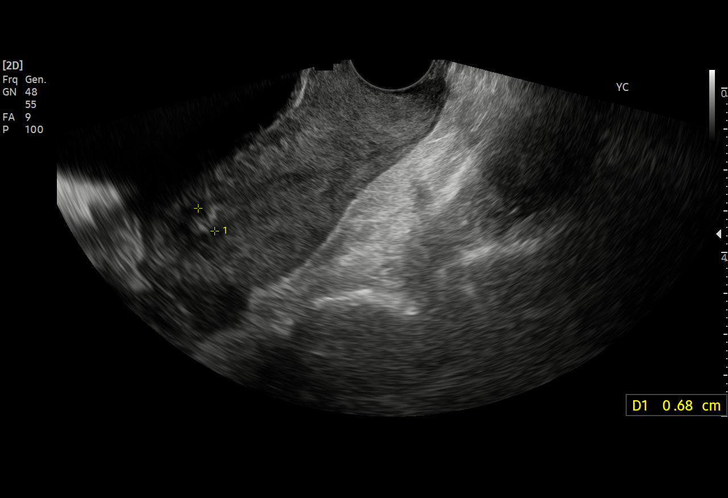
[im 48/82]
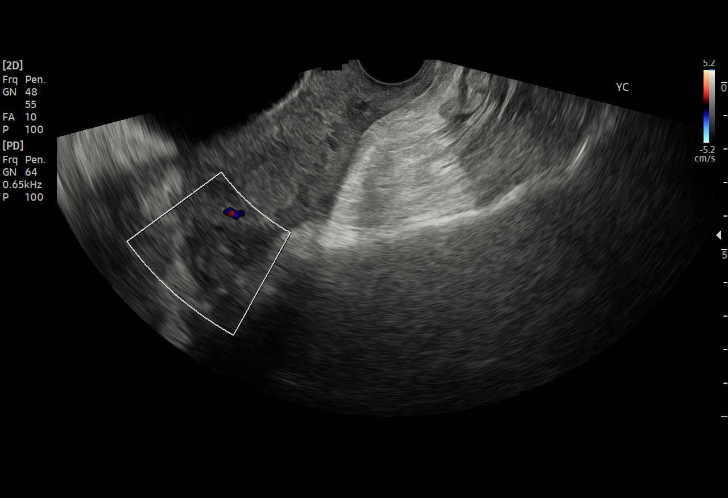
[im 51/82]
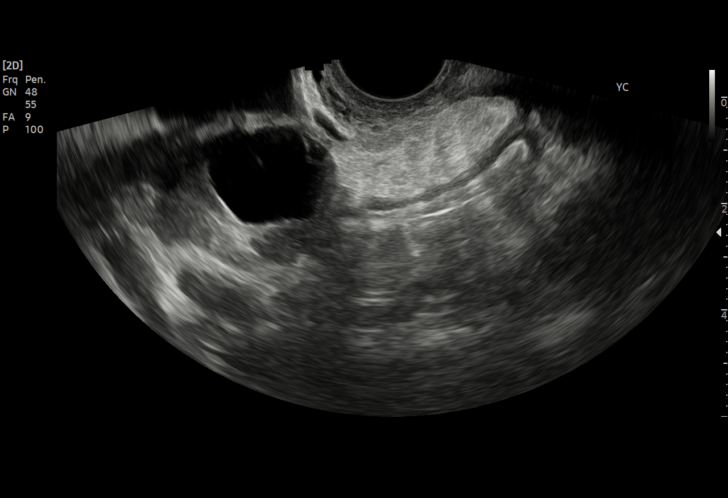
[im 58/82]
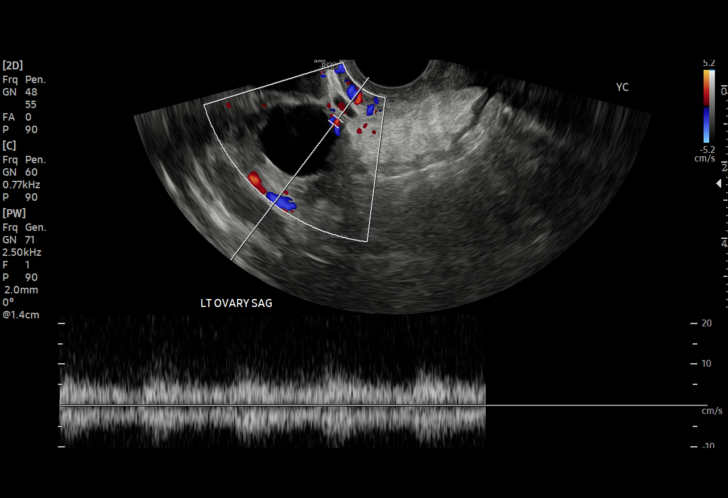
[im 65/82]
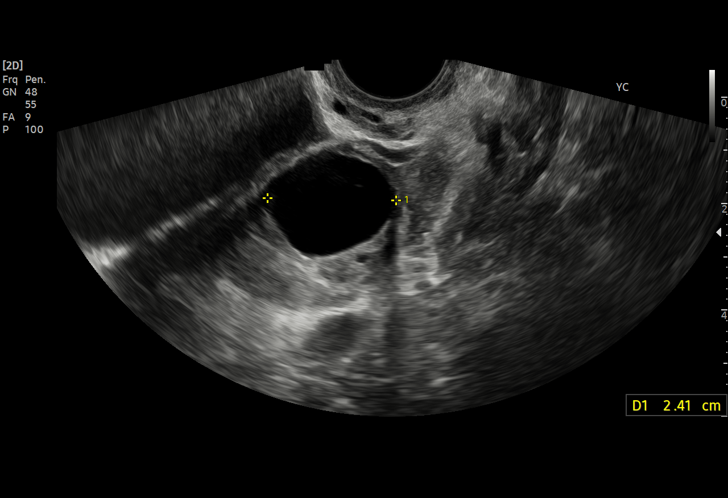
[im 68/82]
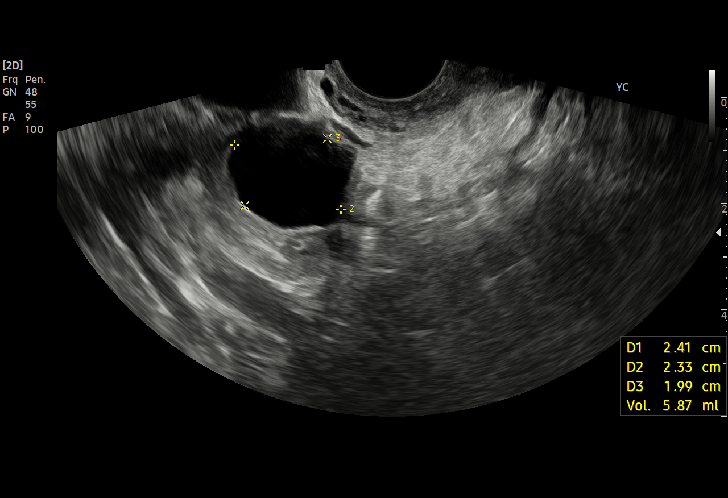
[im 75/82]
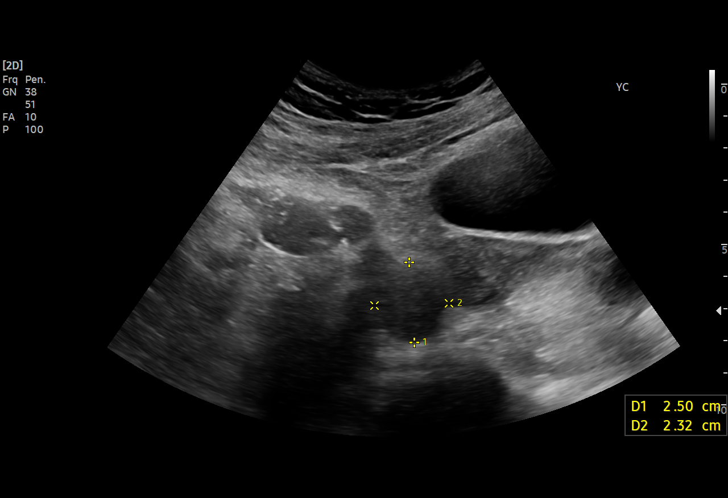
[im 82/82]
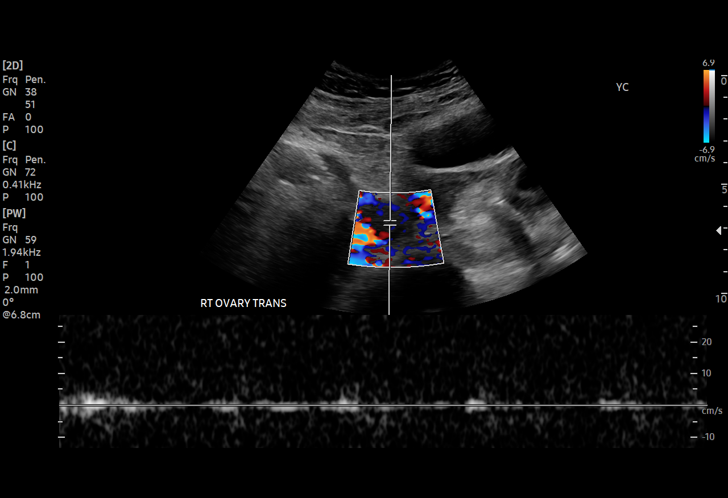

[15 of 25 positions shown; findings below may reference images not displayed]

FINDINGS: Uterus

Measurements: 9.5 x 3.3 x 6.0 cm = volume: 97 mL. No fibroids or
other mass visualized.

Endometrium

Thickness: 7 mm.  No focal abnormality visualized.

Right ovary

Measurements: 2.5 x 2.3 x 2.5 cm = volume: 7.5 mL. Normal
appearance/no adnexal mass.

Left ovary

Measurements: 3.5 x 2.8 x 3.3 cm = volume: 17.0 mL. 2.4 x 2.0 x
cm simple corpus luteum, benign/physiologic.

Pulsed Doppler evaluation of both ovaries demonstrates normal
low-resistance arterial and venous waveforms.

Other findings

No abnormal free fluid.
IMPRESSION: 2.4 cm simple left corpus luteum, benign/physiologic. No follow-up
recommended.

Negative pelvic ultrasound.

No evidence of ovarian torsion.

## 2022-11-17 DIAGNOSIS — Z3689 Encounter for other specified antenatal screening: Secondary | ICD-10-CM | POA: Diagnosis not present

## 2022-11-17 DIAGNOSIS — Z3200 Encounter for pregnancy test, result unknown: Secondary | ICD-10-CM | POA: Diagnosis not present

## 2022-12-13 DIAGNOSIS — L814 Other melanin hyperpigmentation: Secondary | ICD-10-CM | POA: Diagnosis not present

## 2022-12-13 DIAGNOSIS — Z3201 Encounter for pregnancy test, result positive: Secondary | ICD-10-CM | POA: Diagnosis not present

## 2022-12-13 DIAGNOSIS — D2372 Other benign neoplasm of skin of left lower limb, including hip: Secondary | ICD-10-CM | POA: Diagnosis not present

## 2022-12-13 DIAGNOSIS — L821 Other seborrheic keratosis: Secondary | ICD-10-CM | POA: Diagnosis not present

## 2022-12-13 DIAGNOSIS — D2371 Other benign neoplasm of skin of right lower limb, including hip: Secondary | ICD-10-CM | POA: Diagnosis not present

## 2022-12-19 DIAGNOSIS — O3680X0 Pregnancy with inconclusive fetal viability, not applicable or unspecified: Secondary | ICD-10-CM | POA: Diagnosis not present

## 2022-12-19 DIAGNOSIS — Z3A00 Weeks of gestation of pregnancy not specified: Secondary | ICD-10-CM | POA: Diagnosis not present

## 2022-12-21 ENCOUNTER — Telehealth: Payer: Self-pay | Admitting: Cardiology

## 2022-12-21 ENCOUNTER — Other Ambulatory Visit: Payer: Self-pay

## 2022-12-21 MED ORDER — AMLODIPINE BESYLATE 5 MG PO TABS: 5.0000 mg | ORAL_TABLET | Freq: Every day | ORAL | 1 refills | Status: DC

## 2022-12-21 NOTE — Telephone Encounter (Signed)
Called pt, left detailed message on her machine to call the office to set up an appointment.

## 2022-12-21 NOTE — Telephone Encounter (Signed)
Pharmacist of [employer] called to confirm pregnancy of patient. Patient currently take amLODipine (NORVASC) 5 MG tablet . Please call back at [phone]

## 2022-12-21 NOTE — Telephone Encounter (Signed)
Pharmacy called and stated that patient has updated her information and states that she is pregnant. Would you like for her to continue the amlodipine?

## 2022-12-25 ENCOUNTER — Telehealth: Payer: Self-pay | Admitting: Cardiology

## 2022-12-25 NOTE — Telephone Encounter (Signed)
Spoke with Angelica Spencer the pharmacist he is aware per Dr. Servando Salina ( see previous encounter)  patient can continue her amlodipine.

## 2022-12-25 NOTE — Telephone Encounter (Signed)
Pt c/o medication issue:  1. Name of Medication: Amlodipine  2. How are you currently taking this medication (dosage and times per day)?   3. Are you having a reaction (difficulty breathing--STAT)?   4. What is your medication issue? Patient is [redacted] weeks pregnant- he wants to know if the patient should continue taking the Amlodipine

## 2022-12-29 ENCOUNTER — Other Ambulatory Visit: Payer: Self-pay | Admitting: Nurse Practitioner

## 2022-12-29 NOTE — Telephone Encounter (Signed)
Requesting: Sertraline Hydrochloride 50mg  Tablet  Last Visit: 05/11/2022 Next Visit: Visit date not found Last Refill: 07/13/2022  Please Advise

## 2023-01-01 NOTE — Telephone Encounter (Signed)
Lvmtcb to schedule an appt

## 2023-01-02 ENCOUNTER — Ambulatory Visit: Payer: BC Managed Care – PPO | Attending: Cardiology | Admitting: Cardiology

## 2023-01-02 ENCOUNTER — Encounter: Payer: Self-pay | Admitting: Cardiology

## 2023-01-02 VITALS — BP 126/70 | HR 82 | Ht 63.0 in | Wt 212.0 lb

## 2023-01-02 DIAGNOSIS — Z3A11 11 weeks gestation of pregnancy: Secondary | ICD-10-CM

## 2023-01-02 DIAGNOSIS — O10919 Unspecified pre-existing hypertension complicating pregnancy, unspecified trimester: Secondary | ICD-10-CM | POA: Diagnosis not present

## 2023-01-02 MED ORDER — ASPIRIN 81 MG PO TBEC: 81.0000 mg | DELAYED_RELEASE_TABLET | Freq: Every day | ORAL | Status: DC

## 2023-01-02 NOTE — Progress Notes (Unsigned)
Cardio-Obstetrics Clinic  Follow Up Note   Date:  01/04/2023   ID:  Angelica Spencer, DOB 06-12-1990, MRN [id number]  PCP:  Gerre Scull, NP   [facility] Providers Cardiologist:  Thomasene Ripple, DO  Electrophysiologist:  None        Referring MD: Gerre Scull, NP   Chief Complaint: " I am ok"  History of Present Illness:    Angelica Spencer is a 32 y.o. female [G3P2002] who returns for follow up for cardiovascular care in pregnancy.  Medical history of chronic hypertension, anxiety, and polycystic ovary syndrome (PCOS), presents for a follow-up visit. She is currently pregnant, almost [redacted] weeks gestation, following a recent miscarriage at 16 weeks. This is her fourth pregnancy; she has two living children. The patient expresses anxiety and fear related to her current pregnancy due to her recent miscarriage.  The patient is currently taking amlodipine for her hypertension. She has not been monitoring her blood pressure at home, but it has been consistently normal at her OB appointments and at this visit. She is not currently taking aspirin, but is due to start it for preeclampsia prophylaxis.  The patient also reports that she is not currently physically active, aside from her work and caring for her children.   Prior CV Studies Reviewed: The following studies were reviewed today:   Past Medical History:  Diagnosis Date   Anxiety    Depression    Endometriosis    GERD (gastroesophageal reflux disease)    History of hypothyroidism    per pt dx 2019 meds until 2020, no meds since , normalized   Hyperlipidemia    Hypertension    dr c. schumann/  dr [place] Bouchard. [facility]un[facility] Reitman   Migraines    NSVT (nonsustained ventricular tachycardia) [facility])    cardiologist--- dr [place]. schumann   OSA (obstructive sleep apnea)    followed by dr t. turner;   study done in epic 02-02-20245 moderate , (09-04-2022  per pt did get titrated for cpap but now waiting on medical supply company)   PCOS  (polycystic ovarian syndrome)    Pre-diabetes    Wears contact lenses     Past Surgical History:  Procedure Laterality Date   BREAST REDUCTION SURGERY Bilateral 12/21/2021   Procedure: MAMMARY REDUCTION  (BREAST) POSSIBLE LIPOSUCTION;  Surgeon: Peggye Form, DO;  Location: [facility];  Service: Plastics;  Laterality: Bilateral;   DILATION AND EVACUATION N/A 09/06/2022   Procedure: DILATATION AND EVACUATION (D&E) 2ND TRIMESTER;  Surgeon: [name] Mackie, MD;  Location: [facility];  Service: Gynecology;  Laterality: N/A;   OPERATIVE ULTRASOUND N/A 09/06/2022   Procedure: OPERATIVE ULTRASOUND;  Surgeon: [name] Mackie, MD;  Location: [facility];  Service: Gynecology;  Laterality: N/A;   ROBOTIC ASSISTED DIAGNOSTIC LAPAROSCOPY  09/27/2017   @ [facility] by dr a. simon;  EXCISION ENDOMETRIOSIS/ BILATERAL OVARIAN WEDGE RESECTION/  CHROMPERTUBATION/  HYSTEROSCOPIC UTERINE SEPTUM RESECTION W/ VERSAPOINT      OB History     Gravida  3   Para  2   Term  2   Preterm      AB      Living  2      SAB      IAB      Ectopic      Multiple  0   Live Births  2               Current Medications: Current Meds  Medication Sig  aspirin EC 81 MG tablet Take 1 tablet (81 mg total) by mouth daily. Swallow whole.     Allergies:   Imitrex [sumatriptan]   Social History   Socioeconomic History   Marital status: Married    Spouse name: Not on file   Number of children: Not on file   Years of education: Not on file   Highest education level: Not on file  Occupational History   Not on file  Tobacco Use   Smoking status: Never   Smokeless tobacco: Never  Vaping Use   Vaping status: Never Used  Substance and Sexual Activity   Alcohol use: Not Currently   Drug use: Never   Sexual activity: Yes    Birth control/protection: None  Other Topics Concern   Not on file  Social History Narrative   Not on file   Social Determinants  of Health   Financial Resource Strain: Low Risk  (06/10/2022)   Received from [facility]   Overall Financial Resource Strain (CARDIA)    Difficulty of Paying Living Expenses: Not very hard  Food Insecurity: No Food Insecurity (06/10/2022)   Received from [facility], [facility]   Hunger Vital Sign    Worried About Running Out of Food in the Last Year: Never true    Ran Out of Food in the Last Year: Never true  Transportation Needs: No Transportation Needs (06/10/2022)   Received from [facility]   PRAPARE - Transportation    Lack of Transportation (Medical): No    Lack of Transportation (Non-Medical): No  Physical Activity: Insufficiently Active (06/10/2022)   Received from [facility], [facility]   Exercise Vital Sign    Days of Exercise per Week: 3 days    Minutes of Exercise per Session: 30 min  Stress: Stress Concern Present (06/10/2022)   Received from [facility], [facility] - Occupational Stress Questionnaire    Feeling of Stress : Rather much  Social Connections: Socially Integrated (06/10/2022)   Received from [facility], [facility]   Social Network    How would you rate your social network (family, work, friends)?: Good participation with social networks      Family History  Problem Relation Age of Onset   Cancer Mother        non-hodgkins lymphoma   Hyperlipidemia Father    Hypertension Father    Anxiety disorder Sister    Depression Sister    Bipolar disorder Sister    Cancer Maternal Grandmother        hodgkins lymphoma   COPD Paternal Grandmother    Diabetes Paternal Grandmother    Obesity Paternal Grandfather       ROS:   Please see the history of present illness.      All other systems reviewed and are negative.   Labs/EKG Reviewed:    EKG:   EKG was not ordered today.    Recent Labs: 04/11/2022: TSH 1.640 09/06/2022: BUN 6; Creatinine, Ser 0.50; Hemoglobin  12.9; Platelets 221; Potassium 4.1; Sodium 137   Recent Lipid Panel No results found for: "CHOL", "TRIG", "HDL", "CHOLHDL", "LDLCALC", "LDLDIRECT"  Physical Exam:    VS:  BP 126/70   Pulse 82   Ht 5\' 3"  (1.6 m)   Wt 212 lb (96.2 kg)   LMP 04/13/2022 (Exact Date) Comment: 09-04-2022   missed ab (15 wks)  SpO2 99%   BMI 37.55 kg/m     Wt  Readings from Last 3 Encounters:  01/02/23 212 lb (96.2 kg)  09/06/22 205 lb 8 oz (93.2 kg)  06/22/22 213 lb (96.6 kg)     GEN:  Well nourished, well developed in no acute distress HEENT: Normal NECK: No JVD; No carotid bruits LYMPHATICS: No lymphadenopathy CARDIAC: RRR, no murmurs, rubs, gallops RESPIRATORY:  Clear to auscultation without rales, wheezing or rhonchi  ABDOMEN: Soft, non-tender, non-distended MUSCULOSKELETAL:  No edema; No deformity  SKIN: Warm and dry NEUROLOGIC:  Alert and oriented x 3 PSYCHIATRIC:  Normal affect    Risk Assessment/Risk Calculators:     CARPREG II Risk Prediction Index Score:  1.  The patient's risk for a primary cardiac event is 5%.            ASSESSMENT & PLAN:     Chronic Hypertension in Pregnancy Currently on Amlodipine with normal blood pressure readings at [facility] appointments and current visit. Discussed the importance of consistent blood pressure monitoring at home, especially in the second trimester. -Continue Amlodipine. -Start daily home blood pressure monitoring, report if readings are consistently over 130 for three days or over 140 on any day.  Preeclampsia Prophylaxis Discussed starting Aspirin 81mg  for preeclampsia prophylaxis. -Start Aspirin 81mg  daily.  Pregnancy with history of miscarriage Currently at 10 weeks and 5 days gestation, with a history of a miscarriage at 16 weeks. Anxiety related to previous miscarriage and fear of recurrence.   Follow-up in 6 weeks (around [redacted] weeks gestation).   Patient Instructions  Medication Instructions:  Your physician has recommended you  make the following change in your medication:  START: Aspirin 81 mg once daily *If you need a refill on your cardiac medications before your next appointment, please call your pharmacy*   Lab Work: None   Testing/Procedures: None   Follow-Up: At [facility], you and your health needs are our priority.  As part of our continuing mission to provide you with exceptional heart care, we have created designated Provider Care Teams.  These Care Teams include your primary Cardiologist (physician) and Advanced Practice Providers (APPs -  Physician Assistants and Nurse Practitioners) who all work together to provide you with the care you need, when you need it.   Your next appointment:   6 week(s)  Provider:   Thomasene Ripple, DO     Dispo:  No follow-ups on file.   Medication Adjustments/Labs and Tests Ordered: Current medicines are reviewed at length with the patient today.  Concerns regarding medicines are outlined above.  Tests Ordered: No orders of the defined types were placed in this encounter.  Medication Changes: Meds ordered this encounter  Medications   aspirin EC 81 MG tablet    Sig: Take 1 tablet (81 mg total) by mouth daily. Swallow whole.

## 2023-01-02 NOTE — Patient Instructions (Signed)
Medication Instructions:  Your physician has recommended you make the following change in your medication:  START: Aspirin 81 mg once daily *If you need a refill on your cardiac medications before your next appointment, please call your pharmacy*   Lab Work: None   Testing/Procedures: None   Follow-Up: At [facility], you and your health needs are our priority.  As part of our continuing mission to provide you with exceptional heart care, we have created designated Provider Care Teams.  These Care Teams include your primary Cardiologist (physician) and Advanced Practice Providers (APPs -  Physician Assistants and Nurse Practitioners) who all work together to provide you with the care you need, when you need it.   Your next appointment:   6 week(s)  Provider:   Thomasene Ripple, DO

## 2023-01-04 DIAGNOSIS — Z118 Encounter for screening for other infectious and parasitic diseases: Secondary | ICD-10-CM | POA: Diagnosis not present

## 2023-01-04 DIAGNOSIS — Z3689 Encounter for other specified antenatal screening: Secondary | ICD-10-CM | POA: Diagnosis not present

## 2023-01-04 DIAGNOSIS — Z3480 Encounter for supervision of other normal pregnancy, unspecified trimester: Secondary | ICD-10-CM | POA: Diagnosis not present

## 2023-01-04 DIAGNOSIS — Z3481 Encounter for supervision of other normal pregnancy, first trimester: Secondary | ICD-10-CM | POA: Diagnosis not present

## 2023-01-04 DIAGNOSIS — N760 Acute vaginitis: Secondary | ICD-10-CM | POA: Diagnosis not present

## 2023-01-04 DIAGNOSIS — Z124 Encounter for screening for malignant neoplasm of cervix: Secondary | ICD-10-CM | POA: Diagnosis not present

## 2023-01-04 DIAGNOSIS — Z1331 Encounter for screening for depression: Secondary | ICD-10-CM | POA: Diagnosis not present

## 2023-01-21 ENCOUNTER — Encounter (HOSPITAL_COMMUNITY): Payer: Self-pay

## 2023-01-21 ENCOUNTER — Inpatient Hospital Stay (HOSPITAL_COMMUNITY): Payer: BC Managed Care – PPO

## 2023-01-21 ENCOUNTER — Inpatient Hospital Stay (HOSPITAL_COMMUNITY)
Admission: AD | Admit: 2023-01-21 | Discharge: 2023-01-22 | Disposition: A | Discharge status: Home / Self Care | Payer: BC Managed Care – PPO | Attending: Obstetrics & Gynecology | Admitting: Obstetrics & Gynecology

## 2023-01-21 DIAGNOSIS — Z3A13 13 weeks gestation of pregnancy: Secondary | ICD-10-CM | POA: Diagnosis not present

## 2023-01-21 DIAGNOSIS — R102 Pelvic and perineal pain: Secondary | ICD-10-CM | POA: Diagnosis not present

## 2023-01-21 DIAGNOSIS — Z6791 Unspecified blood type, Rh negative: Secondary | ICD-10-CM | POA: Insufficient documentation

## 2023-01-21 DIAGNOSIS — Z23 Encounter for immunization: Secondary | ICD-10-CM | POA: Insufficient documentation

## 2023-01-21 DIAGNOSIS — O209 Hemorrhage in early pregnancy, unspecified: Secondary | ICD-10-CM | POA: Diagnosis not present

## 2023-01-21 DIAGNOSIS — Z3A10 10 weeks gestation of pregnancy: Secondary | ICD-10-CM | POA: Diagnosis not present

## 2023-01-21 DIAGNOSIS — O021 Missed abortion: Secondary | ICD-10-CM | POA: Insufficient documentation

## 2023-01-21 DIAGNOSIS — O26899 Other specified pregnancy related conditions, unspecified trimester: Secondary | ICD-10-CM

## 2023-01-21 LAB — URINALYSIS, ROUTINE W REFLEX MICROSCOPIC
Bilirubin Urine: NEGATIVE
Glucose, UA: NEGATIVE mg/dL
Ketones, ur: NEGATIVE mg/dL
Leukocytes,Ua: NEGATIVE
Nitrite: NEGATIVE
Protein, ur: NEGATIVE mg/dL
Specific Gravity, Urine: 1.025 (ref 1.005–1.030)
pH: 6 (ref 5.0–8.0)

## 2023-01-21 MED ORDER — RHO D IMMUNE GLOBULIN 1500 UNIT/2ML IJ SOSY
300.0000 ug | PREFILLED_SYRINGE | Freq: Once | INTRAMUSCULAR | Status: AC
  Administered 2023-01-21: 300 ug via INTRAMUSCULAR
  Filled 2023-01-21: qty 2

## 2023-01-21 NOTE — MAU Note (Signed)
..  Angelica Spencer is a 32 y.o. at Unknown here in MAU reporting: vaginal bleeding since last night, the bleeding is scant on her underwear and mainly when she wipes. Reports abdominal cramping that began last night.  Has initiated prenatal care at [facility] and has had [place]  Pain score: 1/10 Vitals:   01/21/23 2117  BP: (!) 125/90  Pulse: 89  Resp: 17  Temp: 98.1 F (36.7 C)  SpO2: 100%     OVF:IEPPIR to doppler in triage. Provider aware Lab orders placed from triage:  UA

## 2023-01-21 NOTE — MAU Provider Note (Signed)
History     CSN: [id number]  Arrival date and time: 01/21/23 2033   Event Date/Time   First Provider Initiated Contact with Patient 01/21/23 2127      Chief Complaint  Patient presents with   Vaginal Bleeding    Angelica Spencer is a 32 y.o. [id number] at [redacted]w[redacted]d who receives care at [facility]. She is under the care of CNMs and has her next appt Jan 29, 2023.   She presents today for vaginal bleeding.  She states the bleeding started yesterday night around 2130.  She noted blood with wiping that was dark brown.  She reports increased blood upon waking that was noted in her underwear.  She reports she instructed to monitor, by her provider, and it remained the same. She states this evening the blood was heavier and soaked through her underwear.  She reports some inconsistent and minor cramping.  She reports having 2 [place] at her office with the most recent being at 8 weeks.   OB History     Gravida  3   Para  2   Term  2   Preterm      AB      Living  2      SAB      IAB      Ectopic      Multiple  0   Live Births  2           Past Medical History:  Diagnosis Date   Anxiety    Depression    Endometriosis    GERD (gastroesophageal reflux disease)    History of hypothyroidism    per pt dx 2019 meds until 2020, no meds since , normalized   Hyperlipidemia    Hypertension    dr c. schumann/  dr Kirtland Bouchard. tobb   Migraines    NSVT (nonsustained ventricular tachycardia) [facility])    cardiologist--- dr [place]. schumann   OSA (obstructive sleep apnea)    followed by dr t. turner;   study done in epic 02-02-20245 moderate , (09-04-2022  per pt did get titrated for cpap but now waiting on medical supply company)   PCOS (polycystic ovarian syndrome)    Pre-diabetes    Wears contact lenses     Past Surgical History:  Procedure Laterality Date   BREAST REDUCTION SURGERY Bilateral 12/21/2021   Procedure: MAMMARY REDUCTION  (BREAST) POSSIBLE LIPOSUCTION;  Surgeon: Peggye Form, DO;  Location: [facility];  Service: Plastics;  Laterality: Bilateral;   DILATION AND EVACUATION N/A 09/06/2022   Procedure: DILATATION AND EVACUATION (D&E) 2ND TRIMESTER;  Surgeon: [name] Mackie, MD;  Location: [facility];  Service: Gynecology;  Laterality: N/A;   OPERATIVE ULTRASOUND N/A 09/06/2022   Procedure: OPERATIVE ULTRASOUND;  Surgeon: [name] Mackie, MD;  Location: [facility];  Service: Gynecology;  Laterality: N/A;   ROBOTIC ASSISTED DIAGNOSTIC LAPAROSCOPY  09/27/2017   @ [facility] by dr a. simon;  EXCISION ENDOMETRIOSIS/ BILATERAL OVARIAN WEDGE RESECTION/  CHROMPERTUBATION/  HYSTEROSCOPIC UTERINE SEPTUM RESECTION W/ VERSAPOINT    Family History  Problem Relation Age of Onset   Cancer Mother        non-hodgkins lymphoma   Hyperlipidemia Father    Hypertension Father    Anxiety disorder Sister    Depression Sister    Bipolar disorder Sister    Cancer Maternal Grandmother        hodgkins lymphoma   COPD Paternal Grandmother    Diabetes Paternal Grandmother  Obesity Paternal Grandfather     Social History   Tobacco Use   Smoking status: Never   Smokeless tobacco: Never  Vaping Use   Vaping status: Never Used  Substance Use Topics   Alcohol use: Not Currently   Drug use: Never    Allergies:  Allergies  Allergen Reactions   Imitrex [Sumatriptan]     Pt reports heart palpitations     Medications Prior to Admission  Medication Sig Dispense Refill Last Dose   amLODipine (NORVASC) 5 MG tablet Take 1 tablet (5 mg total) by mouth daily. 90 tablet 1    aspirin EC 81 MG tablet Take 1 tablet (81 mg total) by mouth daily. Swallow whole.      famotidine (PEPCID) 20 MG tablet Take 20 mg by mouth at bedtime.      sertraline (ZOLOFT) 50 MG tablet Take 1 tablet by mouth daily. 30 tablet 0     Review of Systems  Gastrointestinal:  Positive for abdominal pain (Cramping intermittently) and diarrhea (Last week). Negative  for constipation, nausea and vomiting.  Genitourinary:  Positive for vaginal bleeding and vaginal discharge ("Normal pregnancy very thin watery no odor"). Negative for difficulty urinating and dysuria.   Physical Exam   Blood pressure (!) 125/90, pulse 89, temperature 98.1 F (36.7 C), temperature source Oral, resp. rate 17, height 5\' 3"  (1.6 m), weight 98.4 kg, last menstrual period 04/13/2022, SpO2 100%.  Physical Exam Vitals and nursing note reviewed.  Constitutional:      Appearance: Normal appearance.  HENT:     Head: Normocephalic and atraumatic.  Eyes:     Conjunctiva/sclera: Conjunctivae normal.  Cardiovascular:     Rate and Rhythm: Normal rate.  Musculoskeletal:        General: Normal range of motion.     Cervical back: Normal range of motion.  Neurological:     Mental Status: She is alert and oriented to person, place, and time.  Psychiatric:        Mood and Affect: Mood normal.        Behavior: Behavior normal.     MAU Course  Procedures Results for orders placed or performed during the hospital encounter of 01/21/23 (from the past 24 hour(s))  Urinalysis, Routine w reflex microscopic -Urine, Clean Catch     Status: Abnormal   Collection Time: 01/21/23  9:41 PM  Result Value Ref Range   Color, Urine YELLOW YELLOW   APPearance HAZY (A) CLEAR   Specific Gravity, Urine 1.025 1.005 - 1.030   pH 6.0 5.0 - 8.0   Glucose, UA NEGATIVE NEGATIVE mg/dL   Hgb urine dipstick MODERATE (A) NEGATIVE   Bilirubin Urine NEGATIVE NEGATIVE   Ketones, ur NEGATIVE NEGATIVE mg/dL   Protein, ur NEGATIVE NEGATIVE mg/dL   Nitrite NEGATIVE NEGATIVE   Leukocytes,Ua NEGATIVE NEGATIVE   RBC / HPF 0-5 0 - 5 RBC/hpf   WBC, UA 0-5 0 - 5 WBC/hpf   Bacteria, UA RARE (A) NONE SEEN   Squamous Epithelial / HPF 6-10 0 - 5 /HPF   Mucus PRESENT   Rh IG workup (includes ABO/Rh)     Status: None (Preliminary result)   Collection Time: 01/21/23  9:49 PM  Result Value Ref Range   Gestational  Age(Wks) 13.1    ABO/RH(D) O NEG    Antibody Screen NEG    Weak D NEG    Unit Number [id number]    Blood Component Type RHIG    Unit division 00  Status of Unit ISSUED    Transfusion Status      OK TO TRANSFUSE Performed at [facility], [address], [place], [place] [address]    [place] Comp Less 14 Wks  Result Date: 01/21/2023 CLINICAL DATA:  Vaginal bleeding EXAM: OBSTETRIC <14 WK ULTRASOUND TECHNIQUE: Transabdominal ultrasound was performed for evaluation of the gestation as well as the maternal uterus and adnexal regions. COMPARISON:  None Available. FINDINGS: Intrauterine gestational sac: Present Yolk sac:  Present Embryo:  Present Cardiac Activity: Absent CRL:   38.3 mm   10 w 5 d Subchorionic hemorrhage:  None visualized. Maternal uterus/adnexae: Ovaries appear within normal limits. IMPRESSION: Single intrauterine gestation with absent cardiac activity. This is consistent with fetal demise. Critical Value/emergent results were called by telephone at the time of interpretation on 01/21/2023 at 11:01 pm to [facility] , who verbally acknowledged these results. Electronically Signed   By: Alcide Clever M.D.   On: 01/21/2023 23:02     MDM Ultrasound Rhogam  Assessment and Plan  32 year old G4P2012 at 13.1 weeks Vaginal Bleeding O Negative   -Reviewed POC with patient. -Exam performed.  -Discussed sending for formal US for evaluation of placenta to r/o [facility]. -Patient and husband expresses concern with nurse unable to hear FHT. -Informed that [place] will detect if present, but cautioned that sonographer will not allow them to hear or see. -Patient verbalizes understanding. -Await results.  -Rhogam work up ordered.    Cherre Robins 01/21/2023, 9:27 PM   Reassessment (11:01 PM) -Lukens from radiology calls and reports 10.5 week fetus without cardiac activity which is consistent with fetal demise.  -Provider to bedside to inform patient and husband of  results. -Patient appropriately upset. -Condolences given. -Patient and husband given time to process.  Reassessment (11:32 PM) -Patient requesting discharge. -Provider to bedside to address questions or concerns. -Patient states she will contact her office regarding scheduling D&C. -Bleeding/return precautions reviewed. -Rhogam injection given. -Discharge to home in stable condition.  -Message left with [facility] O[facility]YN switchboard with patient name, DOB, and provider number with reason for call.   Cherre Robins MSN, CNM Advanced Practice Provider, [facility] [employer]

## 2023-01-22 ENCOUNTER — Encounter: Payer: Self-pay | Admitting: Cardiology

## 2023-01-22 LAB — RH IG WORKUP (INCLUDES ABO/RH)
ABO/RH(D): O NEG
Antibody Screen: NEGATIVE
Gestational Age(Wks): 13.1
Unit division: 0
Weak D: NEGATIVE

## 2023-01-23 DIAGNOSIS — O021 Missed abortion: Secondary | ICD-10-CM | POA: Diagnosis not present

## 2023-01-24 DIAGNOSIS — O021 Missed abortion: Secondary | ICD-10-CM | POA: Diagnosis not present

## 2023-01-24 DIAGNOSIS — N96 Recurrent pregnancy loss: Secondary | ICD-10-CM | POA: Diagnosis not present

## 2023-02-06 ENCOUNTER — Ambulatory Visit: Payer: BC Managed Care – PPO | Admitting: Internal Medicine

## 2023-02-08 DIAGNOSIS — N96 Recurrent pregnancy loss: Secondary | ICD-10-CM | POA: Diagnosis not present

## 2023-02-08 DIAGNOSIS — O021 Missed abortion: Secondary | ICD-10-CM | POA: Diagnosis not present

## 2023-02-15 ENCOUNTER — Ambulatory Visit: Payer: BC Managed Care – PPO | Admitting: Cardiology

## 2023-02-16 ENCOUNTER — Ambulatory Visit (INDEPENDENT_AMBULATORY_CARE_PROVIDER_SITE_OTHER): Payer: BC Managed Care – PPO | Admitting: Nurse Practitioner

## 2023-02-16 ENCOUNTER — Encounter: Payer: Self-pay | Admitting: Nurse Practitioner

## 2023-02-16 VITALS — BP 126/88 | HR 87 | Temp 98.7°F | Ht 63.0 in | Wt 219.4 lb

## 2023-02-16 DIAGNOSIS — G4700 Insomnia, unspecified: Secondary | ICD-10-CM | POA: Diagnosis not present

## 2023-02-16 DIAGNOSIS — F419 Anxiety disorder, unspecified: Secondary | ICD-10-CM

## 2023-02-16 DIAGNOSIS — F32A Depression, unspecified: Secondary | ICD-10-CM

## 2023-02-16 DIAGNOSIS — N96 Recurrent pregnancy loss: Secondary | ICD-10-CM | POA: Diagnosis not present

## 2023-02-16 MED ORDER — TRAZODONE HCL 50 MG PO TABS: 25.0000 mg | ORAL_TABLET | Freq: Every evening | ORAL | 2 refills | Status: DC | PRN

## 2023-02-16 MED ORDER — HYDROXYZINE PAMOATE 25 MG PO CAPS: 25.0000 mg | ORAL_CAPSULE | Freq: Three times a day (TID) | ORAL | 0 refills | Status: DC | PRN

## 2023-02-16 MED ORDER — SERTRALINE HCL 100 MG PO TABS: 100.0000 mg | ORAL_TABLET | Freq: Every day | ORAL | 0 refills | Status: DC

## 2023-02-16 NOTE — Progress Notes (Signed)
Established Patient Office Visit  Subjective   Patient ID: Angelica Spencer, female    DOB: [birth year]  Age: 32 y.o. MRN: [id number]  Chief Complaint  Patient presents with   Anxiety   Insomnia    Pt stated that she is having trouble falling/staying asleep for the past month    HPI  Discussed the use of AI scribe software for clinical note transcription with the patient, who gave verbal consent to proceed.  History of Present Illness   The patient presents with significant emotional distress following two recent second trimester miscarriages. The patient reports feeling "not awesome" and has been experiencing panic attacks approximately three to four times a week. These panic attacks are particularly triggered by thoughts of the losses and memories of the procedures. The patient also reports difficulty sleeping, taking a long time to fall asleep and not being able to stay asleep. Over-the-counter sleep aids have been tried with limited success. The patient is currently on Zoloft, which was increased to 75mg  after the first miscarriage and seemed to help initially. However, following the second miscarriage, the patient feels that the medication is no longer effective. The patient is scheduled to see a therapist specializing in grief and has previously found this helpful. The patient also expresses concern about the possibility of other underlying health issues contributing to the miscarriages, although no specific symptoms or conditions are mentioned.        02/16/2023    8:37 AM 02/16/2023    8:13 AM 05/11/2022    9:50 AM 04/13/2022    8:55 PM  Depression screen PHQ 2/9  Decreased Interest 1 0 1 1  Down, Depressed, Hopeless 3 0 1 1  PHQ - 2 Score 4 0 2 2  Altered sleeping 3  3 3   Tired, decreased energy 3  3 3   Change in appetite 1  0 2  Feeling bad or failure about yourself  0  0 0  Trouble concentrating 0  1 2  Moving slowly or fidgety/restless 0  0 0  Suicidal thoughts 0  0 0   PHQ-9 Score 11  9 12   Difficult doing work/chores Very difficult  Somewhat difficult Somewhat difficult      02/16/2023    8:38 AM 02/16/2023    8:13 AM 05/11/2022    9:51 AM 04/13/2022    8:55 PM  GAD 7 : Generalized Anxiety Score  Nervous, Anxious, on Edge 3 0 2 3  Control/stop worrying 2 0 0 1  Worry too much - different things 2 0 1 1  Trouble relaxing 1 0 1 2  Restless 1 0 0 0  Easily annoyed or irritable 3 0 3 3  Afraid - awful might happen 0 0 0 0  Total GAD 7 Score 12 0 7 10  Anxiety Difficulty Somewhat difficult Not difficult at all Somewhat difficult Somewhat difficult      ROS See pertinent positives and negatives per HPI.    Objective:     BP 126/88   Pulse 87   Temp 98.7 F (37.1 C)   Ht 5\' 3"  (1.6 m)   Wt 219 lb 6.4 oz (99.5 kg)   LMP 04/13/2022 (Exact Date) Comment: 09-04-2022   missed ab (15 wks)  SpO2 98%   BMI 38.86 kg/m    Physical Exam Vitals and nursing note reviewed.  Constitutional:      General: She is not in acute distress.    Appearance: Normal appearance.  HENT:  Head: Normocephalic.  Eyes:     Conjunctiva/sclera: Conjunctivae normal.  Cardiovascular:     Rate and Rhythm: Normal rate and regular rhythm.     Pulses: Normal pulses.     Heart sounds: Normal heart sounds.  Pulmonary:     Effort: Pulmonary effort is normal.     Breath sounds: Normal breath sounds.  Musculoskeletal:     Cervical back: Normal range of motion.  Skin:    General: Skin is warm.  Neurological:     General: No focal deficit present.     Mental Status: She is alert and oriented to person, place, and time.  Psychiatric:        Mood and Affect: Mood normal. Affect is tearful.        Behavior: Behavior normal.        Thought Content: Thought content normal.        Judgment: Judgment normal.      Assessment & Plan:   Problem List Items Addressed This Visit       Other   Anxiety and depression - Primary    Chronic, not controlled. She reports  worsening depression and anxiety following recent miscarriages while on Zoloft 75mg  daily, indicating the current treatment is insufficient. We will increase Zoloft to 100mg  daily and restart therapy. Will also start hydroxyzine 25mg  TID prn panic attacks. This may make her sleepy. Follow-up in 4-6 weeks.       Relevant Medications   sertraline (ZOLOFT) 100 MG tablet   traZODone (DESYREL) 50 MG tablet   hydrOXYzine (VISTARIL) 25 MG capsule   Insomnia    She has difficulty falling and staying asleep, likely due to anxiety and recent traumatic events. We will start Trazodone 25-50mg  at bedtime as needed for sleep.       History of recurrent miscarriages    After experiencing two second trimester miscarriages with no identified cause despite extensive testing, she remains under the care of an OB/GYN specialist.      Return in about 4 weeks (around 03/16/2023) for Anxiety, Depression.    Gerre Scull, NP

## 2023-02-16 NOTE — Assessment & Plan Note (Signed)
After experiencing two second trimester miscarriages with no identified cause despite extensive testing, she remains under the care of an OB/GYN specialist.

## 2023-02-16 NOTE — Assessment & Plan Note (Signed)
She has difficulty falling and staying asleep, likely due to anxiety and recent traumatic events. We will start Trazodone 25-50mg  at bedtime as needed for sleep.

## 2023-02-16 NOTE — Patient Instructions (Addendum)
It was great to see you!  Increase your zoloft to 100mg  daily, you can take 2 of your current tablets, then go down to 1 with the new bottle  Start trazodone 1/2--1 tablet at bedtime as needed for sleep.   Start hydroxyzine 1 tablet as needed for panic attacks.   Let's follow-up in 4-6 weeks, sooner if you have concerns.  If a referral was placed today, you will be contacted for an appointment. Please note that routine referrals can sometimes take up to 3-4 weeks to process. Please call our office if you haven't heard anything after this time frame.  Take care,  Rodman Pickle, NP

## 2023-02-16 NOTE — Assessment & Plan Note (Signed)
Chronic, not controlled. She reports worsening depression and anxiety following recent miscarriages while on Zoloft 75mg  daily, indicating the current treatment is insufficient. We will increase Zoloft to 100mg  daily and restart therapy. Will also start hydroxyzine 25mg  TID prn panic attacks. This may make her sleepy. Follow-up in 4-6 weeks.

## 2023-02-20 DIAGNOSIS — F4321 Adjustment disorder with depressed mood: Secondary | ICD-10-CM | POA: Diagnosis not present

## 2023-02-28 ENCOUNTER — Other Ambulatory Visit: Payer: Self-pay | Admitting: Medical Genetics

## 2023-03-07 ENCOUNTER — Other Ambulatory Visit: Payer: Self-pay | Admitting: Medical Genetics

## 2023-03-09 ENCOUNTER — Other Ambulatory Visit: Payer: Self-pay | Admitting: Nurse Practitioner

## 2023-03-14 ENCOUNTER — Other Ambulatory Visit: Payer: Self-pay

## 2023-03-14 ENCOUNTER — Telehealth: Payer: Self-pay

## 2023-03-14 MED ORDER — SERTRALINE HCL 100 MG PO TABS: 100.0000 mg | ORAL_TABLET | Freq: Every day | ORAL | 1 refills | Status: DC

## 2023-03-14 NOTE — Telephone Encounter (Signed)
Requesting a 6 month supply

## 2023-03-14 NOTE — Telephone Encounter (Signed)
Toniann Fail called from [facility] regarding Rx # [id number] which is Zepbound. Under patient's profile it has that she is pregnant. Rx was transferred there to [facility] from [facility] that was orginally prescribed 05-26-22 and transferred a few days ago.  Toniann Fail wanted to clarify the status of pregnancy before filling  the prescription.

## 2023-03-15 NOTE — Telephone Encounter (Signed)
I called and spoke with Antoine Primas the pharmacist at [facility] and notified him that patient is not pregnant and had a miscarriage

## 2023-03-18 ENCOUNTER — Encounter: Payer: Self-pay | Admitting: Nurse Practitioner

## 2023-03-20 ENCOUNTER — Ambulatory Visit (INDEPENDENT_AMBULATORY_CARE_PROVIDER_SITE_OTHER): Payer: BC Managed Care – PPO | Admitting: Nurse Practitioner

## 2023-03-20 ENCOUNTER — Encounter: Payer: Self-pay | Admitting: Nurse Practitioner

## 2023-03-20 VITALS — BP 130/82 | HR 88 | Temp 97.1°F | Ht 63.0 in | Wt 223.2 lb

## 2023-03-20 DIAGNOSIS — F32A Depression, unspecified: Secondary | ICD-10-CM

## 2023-03-20 DIAGNOSIS — R7303 Prediabetes: Secondary | ICD-10-CM | POA: Diagnosis not present

## 2023-03-20 DIAGNOSIS — Z6839 Body mass index (BMI) 39.0-39.9, adult: Secondary | ICD-10-CM

## 2023-03-20 DIAGNOSIS — M2550 Pain in unspecified joint: Secondary | ICD-10-CM | POA: Insufficient documentation

## 2023-03-20 DIAGNOSIS — F5101 Primary insomnia: Secondary | ICD-10-CM

## 2023-03-20 DIAGNOSIS — F419 Anxiety disorder, unspecified: Secondary | ICD-10-CM | POA: Diagnosis not present

## 2023-03-20 LAB — HEMOGLOBIN A1C: Hgb A1c MFr Bld: 5.7 % (ref 4.6–6.5)

## 2023-03-20 NOTE — Assessment & Plan Note (Signed)
Her anxiety and panic disorder has improved with the use of hydroxyzine 25mg  as needed and increasing zoloft to 100mg  daily, and she has not experienced recent panic attacks. We will continue the current treatment plan and therapy.

## 2023-03-20 NOTE — Assessment & Plan Note (Signed)
She experiences morning joint pain and swelling, particularly in her hands, suggesting a possible autoimmune etiology. We will order an ANA panel to screen for autoimmune conditions.

## 2023-03-20 NOTE — Assessment & Plan Note (Signed)
Elevated blood sugars were noted in previous labs, indicating prediabetes. We will order an A1c to monitor blood sugar control and encourage continued lifestyle modifications for weight loss and blood sugar control.

## 2023-03-20 NOTE — Assessment & Plan Note (Signed)
BMI 39.5 associated with hypertension and sleep apnea. She has initiated lifestyle modifications including diet changes and exercise for weight management. We encourage continued lifestyle modifications for weight loss.

## 2023-03-20 NOTE — Assessment & Plan Note (Signed)
Her insomnia has improved with Trazodone, and she reports better sleep quality when taking the full dose. We will continue Trazodone 50mg  as needed for sleep.

## 2023-03-20 NOTE — Progress Notes (Signed)
Established Patient Office Visit  Subjective   Patient ID: Angelica Spencer, female    DOB: May 31, 1990  Age: 32 y.o. MRN: [id number]  Chief Complaint  Patient presents with   Anxiety and Depression    Follow up, request lab work    HPI  Discussed the use of AI scribe software for clinical note transcription with the patient, who gave verbal consent to proceed.  History of Present Illness   The patient, with a history of chronic migraines and recurrent miscarriages, presents with joint pain and general fatigue. She reports that her joints hurt in the morning, with her hands being particularly swollen and achy. This discomfort tends to dissipate throughout the day. She also reports general fatigue, which she attributes to caring for her toddlers full-time.  The patient has been trying to manage her weight by working out three days a week and making healthier dietary choices. She has a history of hypothyroidism, but her thyroid levels have been normal for several years.  The patient has been taking hydroxyzine for panic attacks, which have been infrequent. She also takes trazodone for sleep, which she reports has been effective. She has been feeling better overall, with improved mood and fewer panic attacks since increasing sertraline and going to therapy.       ROS See pertinent positives and negatives per HPI.    Objective:     BP 130/82 (BP Location: Left Arm, Patient Position: Sitting)   Pulse 88   Temp (!) 97.1 F (36.2 C)   Ht 5\' 3"  (1.6 m)   Wt 223 lb 3.2 oz (101.2 kg)   LMP 03/04/2023 (Exact Date)   SpO2 99%   Breastfeeding No   BMI 39.54 kg/m  BP Readings from Last 3 Encounters:  03/20/23 130/82  02/16/23 126/88  01/21/23 (!) 125/90   Wt Readings from Last 3 Encounters:  03/20/23 223 lb 3.2 oz (101.2 kg)  02/16/23 219 lb 6.4 oz (99.5 kg)  01/21/23 217 lb (98.4 kg)      Physical Exam Vitals and nursing note reviewed.  Constitutional:      General: She is  not in acute distress.    Appearance: Normal appearance.  HENT:     Head: Normocephalic.  Eyes:     Conjunctiva/sclera: Conjunctivae normal.  Cardiovascular:     Rate and Rhythm: Normal rate and regular rhythm.     Pulses: Normal pulses.     Heart sounds: Normal heart sounds.  Pulmonary:     Effort: Pulmonary effort is normal.     Breath sounds: Normal breath sounds.  Musculoskeletal:     Cervical back: Normal range of motion.  Skin:    General: Skin is warm.  Neurological:     General: No focal deficit present.     Mental Status: She is alert and oriented to person, place, and time.  Psychiatric:        Mood and Affect: Mood normal.        Behavior: Behavior normal.        Thought Content: Thought content normal.        Judgment: Judgment normal.      Assessment & Plan:   Problem List Items Addressed This Visit       Other   Anxiety and depression - Primary   Her anxiety and panic disorder has improved with the use of hydroxyzine 25mg  as needed and increasing zoloft to 100mg  daily, and she has not experienced recent panic attacks. We will  continue the current treatment plan and therapy.      Morbid obesity (HCC)   BMI 39.5 associated with hypertension and sleep apnea. She has initiated lifestyle modifications including diet changes and exercise for weight management. We encourage continued lifestyle modifications for weight loss.      Insomnia   Her insomnia has improved with Trazodone, and she reports better sleep quality when taking the full dose. We will continue Trazodone 50mg  as needed for sleep.      Multiple joint pain   She experiences morning joint pain and swelling, particularly in her hands, suggesting a possible autoimmune etiology. We will order an ANA panel to screen for autoimmune conditions.      Relevant Orders   ANA w/Reflex   Rheumatoid Factor   Prediabetes   Elevated blood sugars were noted in previous labs, indicating prediabetes. We will  order an A1c to monitor blood sugar control and encourage continued lifestyle modifications for weight loss and blood sugar control.      Relevant Orders   Hemoglobin A1c    Return in about 6 months (around 09/18/2023) for CPE.    Gerre Scull, NP

## 2023-03-20 NOTE — Patient Instructions (Signed)
It was great to see you!  We are checking your labs today and will let you know the results via mychart/phone.   I think you have a great plan for exercise and weight loss!   You can see if your insurance covers qsymia or contrave   If interested, cash price:  Contrave $99 per month Zepbound $399 for lowest dose, $550 fo5mg , then $650 for all other doses   Let's follow-up in 6 months, sooner if you have concerns.  If a referral was placed today, you will be contacted for an appointment. Please note that routine referrals can sometimes take up to 3-4 weeks to process. Please call our office if you haven't heard anything after this time frame.  Take care,  Rodman Pickle, NP

## 2023-03-21 ENCOUNTER — Encounter: Payer: Self-pay | Admitting: Nurse Practitioner

## 2023-03-21 LAB — RHEUMATOID FACTOR: Rheumatoid fact SerPl-aCnc: 10 [IU]/mL (ref <14)

## 2023-03-21 LAB — ANA W/REFLEX: Anti Nuclear Antibody (ANA): NEGATIVE

## 2023-03-22 ENCOUNTER — Other Ambulatory Visit: Payer: Self-pay | Admitting: Nurse Practitioner

## 2023-03-22 MED ORDER — CONTRAVE 8-90 MG PO TB12: ORAL_TABLET | ORAL | 0 refills | Status: DC

## 2023-03-22 NOTE — Telephone Encounter (Signed)
I called and spoke with Gala Romney at [facility] and notified him that patient had a miscarriage and is not pregnant and ok to take medication. He said that he will update patient's profile.

## 2023-03-30 ENCOUNTER — Other Ambulatory Visit (HOSPITAL_COMMUNITY): Payer: Self-pay | Attending: Medical Genetics

## 2023-03-30 DIAGNOSIS — R7303 Prediabetes: Secondary | ICD-10-CM | POA: Diagnosis not present

## 2023-04-02 MED ORDER — CONTRAVE 8-90 MG PO TB12: ORAL_TABLET | ORAL | 0 refills | Status: DC

## 2023-04-02 NOTE — Addendum Note (Signed)
Addended by: Rodman Pickle A on: 04/02/2023 12:15 PM   Modules accepted: Orders

## 2023-04-04 NOTE — L&D Delivery Note (Signed)
   Delivery Note:   [id number] at [redacted]w[redacted]d  Admitting diagnosis: Chronic hypertension with superimposed preeclampsia [O11.9] Risks: Essential HTN, anemia, prediabetes Onset of labor: IOL IOL/Augmentation: AROM and Pitocin  ROM: AROM clear fluid  Complete dilation at   1840 Onset of pushing at 1840 FHR second stage Cat 2 tracing, variable decelerations  Analgesia/Anesthesia intrapartum:  Pushing in lithotomy position with CNM and L&D staff support, husband Worth present for birth and supportive.  Delivery of a Live born female  Birth Weight: 5 lb 14.5 oz (2680 g) APGAR: 8, 9  Newborn Delivery   Birth date/time: 03/06/2024 18:52:00 Delivery type:      in cephalic presentation, position OA to ROA.  APGAR:1 min-8 , 5 min-9   Nuchal Cord: Yes loose and reduced Cord double clamped after cessation of pulsation, cut by FOB at bedside after 2 intentional delay.  Collection of cord blood for typing completed. Arterial cord blood sample-    Placenta delivered-Expressed with  . Uterotonics: pitocin .  Placenta to L&D, marginal cord noted.  Uterine tone firm.  Bleeding moderate.    laceration identified.  Episiotomy:  Local analgesia: n/a  Repair: Est. Blood Loss (mL):0.00  Complications: None  Mom to postpartum, IOL for essential HTN w/ superimposed PEC w/ HA and severe range pressures upon admission to L&D. HA relieved with fluids and well controlled throughout labor on Labetalol  300mg  q8hr, to continue labetalol  and defer magnesium sulfate at this time unless BP indicate. Rpt PIH labs in AM.   - Anemia, was scheduled for IV venofer today, to given PPD1.  - Dr. Kandyce aware of pt status, delivery and pptm POC.     - Baby girl Schuyler to Couplet care / Skin to Skin.  Delivery Report:  Review the Delivery Report for details.    Sherrilee KANDICE Both, CNM, MSN 03/06/2024, 7:28 PM

## 2023-04-08 DIAGNOSIS — R7303 Prediabetes: Secondary | ICD-10-CM | POA: Diagnosis not present

## 2023-04-18 ENCOUNTER — Other Ambulatory Visit (HOSPITAL_COMMUNITY): Payer: Self-pay

## 2023-04-18 DIAGNOSIS — H1030 Unspecified acute conjunctivitis, unspecified eye: Secondary | ICD-10-CM | POA: Diagnosis not present

## 2023-04-18 MED ORDER — OFLOXACIN 0.3 % OP SOLN
OPHTHALMIC | 0 refills | Status: DC
  Filled 2023-04-18: qty 5, 7d supply, fill #0

## 2023-04-29 ENCOUNTER — Encounter: Payer: Self-pay | Admitting: Nurse Practitioner

## 2023-04-30 ENCOUNTER — Other Ambulatory Visit: Payer: Self-pay | Admitting: Nurse Practitioner

## 2023-05-14 ENCOUNTER — Other Ambulatory Visit: Payer: Self-pay | Admitting: Nurse Practitioner

## 2023-05-15 NOTE — Telephone Encounter (Signed)
Requesting: SERTRALINE 100MG  TABLETS  Last Visit: 03/20/2023 Next Visit: 09/18/2023 Last Refill: 03/14/2023  Please Advise

## 2023-07-30 DIAGNOSIS — Z3200 Encounter for pregnancy test, result unknown: Secondary | ICD-10-CM | POA: Diagnosis not present

## 2023-07-30 DIAGNOSIS — O09299 Supervision of pregnancy with other poor reproductive or obstetric history, unspecified trimester: Secondary | ICD-10-CM | POA: Diagnosis not present

## 2023-08-01 DIAGNOSIS — Z3A00 Weeks of gestation of pregnancy not specified: Secondary | ICD-10-CM | POA: Diagnosis not present

## 2023-08-01 DIAGNOSIS — O09299 Supervision of pregnancy with other poor reproductive or obstetric history, unspecified trimester: Secondary | ICD-10-CM | POA: Diagnosis not present

## 2023-08-16 DIAGNOSIS — Z3201 Encounter for pregnancy test, result positive: Secondary | ICD-10-CM | POA: Diagnosis not present

## 2023-09-10 DIAGNOSIS — Z3182 Encounter for Rh incompatibility status: Secondary | ICD-10-CM | POA: Diagnosis not present

## 2023-09-10 DIAGNOSIS — Z118 Encounter for screening for other infectious and parasitic diseases: Secondary | ICD-10-CM | POA: Diagnosis not present

## 2023-09-10 DIAGNOSIS — Z3A10 10 weeks gestation of pregnancy: Secondary | ICD-10-CM | POA: Diagnosis not present

## 2023-09-10 DIAGNOSIS — Z3689 Encounter for other specified antenatal screening: Secondary | ICD-10-CM | POA: Diagnosis not present

## 2023-09-10 DIAGNOSIS — Z3481 Encounter for supervision of other normal pregnancy, first trimester: Secondary | ICD-10-CM | POA: Diagnosis not present

## 2023-09-10 DIAGNOSIS — N96 Recurrent pregnancy loss: Secondary | ICD-10-CM | POA: Diagnosis not present

## 2023-09-10 DIAGNOSIS — O09291 Supervision of pregnancy with other poor reproductive or obstetric history, first trimester: Secondary | ICD-10-CM | POA: Diagnosis not present

## 2023-09-10 LAB — OB RESULTS CONSOLE GC/CHLAMYDIA
Chlamydia: NEGATIVE
Neisseria Gonorrhea: NEGATIVE

## 2023-09-10 LAB — OB RESULTS CONSOLE HEPATITIS B SURFACE ANTIGEN: Hepatitis B Surface Ag: NEGATIVE

## 2023-09-10 LAB — OB RESULTS CONSOLE HIV ANTIBODY (ROUTINE TESTING): HIV: NONREACTIVE

## 2023-09-10 LAB — OB RESULTS CONSOLE RPR: RPR: NONREACTIVE

## 2023-09-10 LAB — OB RESULTS CONSOLE RUBELLA ANTIBODY, IGM: Rubella: UNDETERMINED

## 2023-09-13 ENCOUNTER — Telehealth: Payer: Self-pay | Admitting: Nurse Practitioner

## 2023-09-13 NOTE — Telephone Encounter (Signed)
 Copied from CRM [id number]. Topic: Clinical - Prescription Issue >> Sep 13, 2023  2:25 PM Freya Jesus wrote: Reason for CRM: Farzin from [facility]; patient is pregnant and they want to know if they should discontinue medications: Naltrexone-buPROPion HCl ER (CONTRAVE ) 8-90 MG TB12 [[id number]] - sertraline  (ZOLOFT ) 100 MG tablet [[id number]] - traZODone  (DESYREL ) 50 MG tablet [[id number]]. Call back: [phone].

## 2023-09-14 NOTE — Telephone Encounter (Signed)
 I called [facility] and spoke with pharmacist Dorris Gaul. and notified him to stop Contrave  and Trazodone  since she has marked on her file that she is pregnant.

## 2023-09-18 ENCOUNTER — Encounter: Payer: BC Managed Care – PPO | Admitting: Nurse Practitioner

## 2023-09-22 ENCOUNTER — Inpatient Hospital Stay (HOSPITAL_COMMUNITY)
Admission: AD | Admit: 2023-09-22 | Discharge: 2023-09-22 | Disposition: A | Discharge status: Home / Self Care | Attending: Family Medicine | Admitting: Family Medicine

## 2023-09-22 ENCOUNTER — Encounter (HOSPITAL_COMMUNITY): Payer: Self-pay

## 2023-09-22 ENCOUNTER — Inpatient Hospital Stay (HOSPITAL_COMMUNITY)

## 2023-09-22 DIAGNOSIS — Z3A12 12 weeks gestation of pregnancy: Secondary | ICD-10-CM | POA: Diagnosis not present

## 2023-09-22 DIAGNOSIS — O10011 Pre-existing essential hypertension complicating pregnancy, first trimester: Secondary | ICD-10-CM | POA: Diagnosis not present

## 2023-09-22 DIAGNOSIS — Z23 Encounter for immunization: Secondary | ICD-10-CM | POA: Diagnosis not present

## 2023-09-22 DIAGNOSIS — O209 Hemorrhage in early pregnancy, unspecified: Secondary | ICD-10-CM

## 2023-09-22 DIAGNOSIS — O99011 Anemia complicating pregnancy, first trimester: Secondary | ICD-10-CM | POA: Diagnosis not present

## 2023-09-22 DIAGNOSIS — Z6791 Unspecified blood type, Rh negative: Secondary | ICD-10-CM

## 2023-09-22 DIAGNOSIS — O09291 Supervision of pregnancy with other poor reproductive or obstetric history, first trimester: Secondary | ICD-10-CM | POA: Insufficient documentation

## 2023-09-22 DIAGNOSIS — Z79899 Other long term (current) drug therapy: Secondary | ICD-10-CM | POA: Insufficient documentation

## 2023-09-22 DIAGNOSIS — Z6741 Type O blood, Rh negative: Secondary | ICD-10-CM | POA: Insufficient documentation

## 2023-09-22 DIAGNOSIS — D649 Anemia, unspecified: Secondary | ICD-10-CM | POA: Diagnosis not present

## 2023-09-22 LAB — URINALYSIS, ROUTINE W REFLEX MICROSCOPIC
Bilirubin Urine: NEGATIVE
Glucose, UA: NEGATIVE mg/dL
Hgb urine dipstick: NEGATIVE
Ketones, ur: NEGATIVE mg/dL
Leukocytes,Ua: NEGATIVE
Nitrite: NEGATIVE
Protein, ur: NEGATIVE mg/dL
Specific Gravity, Urine: 1.014 (ref 1.005–1.030)
pH: 5 (ref 5.0–8.0)

## 2023-09-22 LAB — CBC
HCT: 34.9 % — ABNORMAL LOW (ref 36.0–46.0)
Hemoglobin: 11.5 g/dL — ABNORMAL LOW (ref 12.0–15.0)
MCH: 28.3 pg (ref 26.0–34.0)
MCHC: 33 g/dL (ref 30.0–36.0)
MCV: 85.7 fL (ref 80.0–100.0)
Platelets: 234 10*3/uL (ref 150–400)
RBC: 4.07 MIL/uL (ref 3.87–5.11)
RDW: 15.1 % (ref 11.5–15.5)
WBC: 9.4 10*3/uL (ref 4.0–10.5)
nRBC: 0 % (ref 0.0–0.2)

## 2023-09-22 LAB — WET PREP, GENITAL
Clue Cells Wet Prep HPF POC: NONE SEEN
Sperm: NONE SEEN
Trich, Wet Prep: NONE SEEN
WBC, Wet Prep HPF POC: <10 (ref <10)
Yeast Wet Prep HPF POC: NONE SEEN

## 2023-09-22 MED ORDER — RHO D IMMUNE GLOBULIN 1500 UNIT/2ML IJ SOSY
300.0000 ug | PREFILLED_SYRINGE | Freq: Once | INTRAMUSCULAR | Status: AC
  Administered 2023-09-22: 300 ug via INTRAMUSCULAR
  Filled 2023-09-22: qty 2

## 2023-09-22 NOTE — MAU Provider Note (Signed)
 Chief Complaint:  Vaginal Bleeding   HPI   Event Date/Time   First Provider Initiated Contact with Patient 09/22/23 0847      Angelica Spencer is a 33 y.o. [id number] at [redacted]w[redacted]d presenting to maternity admissions reporting vaginal bleeding. She endorses light bleeding on the toilet paper after she wiped this morning around 0615. They have not been passing blood clots, have not bled through their clothes, and have not passed any tissues.  Denies abdominal cramping, vaginal discharge, recent intercourse or trauma.  Positive history for history of prior miscarriage, negative for tubal ligation, IUD in situ, gonorrhea, and chlamydia. They have had IUP confirmed with ultrasound.  Additional history obtained from partner  Pregnancy Course: Receives care at [facility]. Prenatal records reviewed.  Pregnancy complicated by chronic hypertension, currently taking 100 mg labetalol  twice daily.  She has not taken her morning dose yet today.  She also notes that she tends to have anemia even outside of pregnancy.  Past Medical History:  Diagnosis Date   Anxiety    Depression    Endometriosis    GERD (gastroesophageal reflux disease)    History of hypothyroidism    per pt dx 2019 meds until 2020, no meds since , normalized   Hyperlipidemia    Hypertension    dr c. schumann/  dr marla. tobb   Migraines    NSVT (nonsustained ventricular tachycardia) [facility])    cardiologist--- dr Ford. schumann   OSA (obstructive sleep apnea)    followed by dr t. shlomo;   study done in epic 02-02-20245 moderate , (09-04-2022  per pt did get titrated for cpap but now waiting on medical supply company)   PCOS (polycystic ovarian syndrome)    Pre-diabetes    Wears contact lenses    OB History  Gravida Para Term Preterm AB Living  5 2 2  2 2   SAB IAB Ectopic Multiple Live Births  2   0 2    # Outcome Date GA Lbr Len/2nd Weight Sex Type Anes PTL Lv  5 Current           4 SAB 09/2022 [redacted]w[redacted]d         3 Term 09/03/20 [redacted]w[redacted]d  01:22 / 00:31 3521 g M Vag-Spont EPI  LIV  2 Term 2020     Vag-Spont   LIV  1 SAB  [redacted]w[redacted]d          Past Surgical History:  Procedure Laterality Date   BREAST REDUCTION SURGERY Bilateral 12/21/2021   Procedure: MAMMARY REDUCTION  (BREAST) POSSIBLE LIPOSUCTION;  Surgeon: Lowery Estefana RAMAN, DO;  Location: [facility];  Service: Plastics;  Laterality: Bilateral;   DILATION AND EVACUATION N/A 09/06/2022   Procedure: DILATATION AND EVACUATION (D&E) 2ND TRIMESTER;  Surgeon: Gorge Ade, MD;  Location[facility];  Service: Gynecology;  Laterality: N/A;   OPERATIVE ULTRASOUND N/A 09/06/2022   Procedure: OPERATIVE ULTRASOUND;  Surgeon: Gorge Ade, MD;  Location: [facility];  Service: Gynecology;  Laterality: N/A;   ROBOTIC ASSISTED DIAGNOSTIC LAPAROSCOPY  09/27/2017   @ [facility] by dr a. simon;  EXCISION ENDOMETRIOSIS/ BILATERAL OVARIAN WEDGE RESECTION/  CHROMPERTUBATION/  HYSTEROSCOPIC UTERINE SEPTUM RESECTION W/ VERSAPOINT   Family History  Problem Relation Age of Onset   Cancer Mother        non-hodgkins lymphoma   Hyperlipidemia Father    Hypertension Father    Anxiety disorder Sister    Depression Sister    Bipolar disorder Sister  Cancer Maternal Grandmother        hodgkins lymphoma   COPD Paternal Grandmother    Diabetes Paternal Grandmother    Obesity Paternal Grandfather    Social History   Tobacco Use   Smoking status: Never   Smokeless tobacco: Never  Vaping Use   Vaping status: Never Used  Substance Use Topics   Alcohol use: Not Currently   Drug use: Never   Allergies  Allergen Reactions   Imitrex [Sumatriptan]     Pt reports heart palpitations    Medications Prior to Admission  Medication Sig Dispense Refill Last Dose/Taking   amLODipine  (NORVASC ) 5 MG tablet Take 1 tablet (5 mg total) by mouth daily. 90 tablet 1    famotidine  (PEPCID ) 20 MG tablet Take 20 mg by mouth at bedtime.      hydrOXYzine  (VISTARIL ) 25  MG capsule Take 1 capsule (25 mg total) by mouth every 8 (eight) hours as needed. 30 capsule 0    Naltrexone-buPROPion HCl ER (CONTRAVE ) 8-90 MG TB12 Take 1 tablet by mouth daily for 7 days then 1 twice a day for 7 days, then 2 in the am and 1 in the pm for 7 days then take 2 twice daily thereafter. 120 tablet 0    ofloxacin  (OCUFLOX ) 0.3 % ophthalmic solution Instill 2 drops in affected eyes every 4 hours for the first 2 days, then instill 2 drops 4 times daily for an additional 5 days 5 mL 0    sertraline  (ZOLOFT ) 100 MG tablet TAKE 1 TABLET(100 MG) BY MOUTH DAILY 90 tablet 1    traZODone  (DESYREL ) 50 MG tablet Take 0.5-1 tablets (25-50 mg total) by mouth at bedtime as needed for sleep. 30 tablet 2     I have reviewed patient's Past Medical Hx, Surgical Hx, Family Hx, Social Hx, medications and allergies.   ROS  Pertinent items noted in HPI and remainder of comprehensive ROS otherwise negative.   PHYSICAL EXAM  Patient Vitals for the past 24 hrs:  BP Temp Temp src Pulse Resp SpO2 Weight  09/22/23 0744 (!) 137/92 98.4 F (36.9 C) Oral 66 14 100 % 94.9 kg    Constitutional: Well-developed, well-nourished female in no acute distress.  HEENT: atraumatic, normocephalic. Neck has normal ROM. EOM intact. Cardiovascular: normal rate & rhythm, warm and well-perfused Respiratory: normal effort, no problems with respiration noted GI: Abd soft, non-tender, non-distended MSK: Extremities nontender, no edema, normal ROM Skin: warm and dry. Acyanotic, no jaundice or pallor. Neurologic: Alert and oriented x 4. No abnormal coordination. Psychiatric: Normal mood. Speech not slurred, not rapid/pressured. Patient is cooperative. GU: no CVA tenderness    Labs: Results for orders placed or performed during the hospital encounter of 09/22/23 (from the past 24 hours)  Urinalysis, Routine w reflex microscopic -Urine, Clean Catch     Status: Abnormal   Collection Time: 09/22/23  8:09 AM  Result Value Ref  Range   Color, Urine YELLOW YELLOW   APPearance HAZY (A) CLEAR   Specific Gravity, Urine 1.014 1.005 - 1.030   pH 5.0 5.0 - 8.0   Glucose, UA NEGATIVE NEGATIVE mg/dL   Hgb urine dipstick NEGATIVE NEGATIVE   Bilirubin Urine NEGATIVE NEGATIVE   Ketones, ur NEGATIVE NEGATIVE mg/dL   Protein, ur NEGATIVE NEGATIVE mg/dL   Nitrite NEGATIVE NEGATIVE   Leukocytes,Ua NEGATIVE NEGATIVE  Wet prep, genital     Status: None   Collection Time: 09/22/23  8:09 AM   Specimen: PATH Cytology Cervicovaginal Ancillary Only  Result Value Ref Range   Yeast Wet Prep HPF POC NONE SEEN NONE SEEN   Trich, Wet Prep NONE SEEN NONE SEEN   Clue Cells Wet Prep HPF POC NONE SEEN NONE SEEN   WBC, Wet Prep HPF POC <10 <10   Sperm NONE SEEN   Rh IG workup (includes ABO/Rh)     Status: None (Preliminary result)   Collection Time: 09/22/23  8:37 AM  Result Value Ref Range   Gestational Age(Wks) 12    ABO/RH(D) O NEG    Antibody Screen NEG    Unit Number [id number]    Blood Component Type RHIG    Unit division 00    Status of Unit ISSUED    Transfusion Status      OK TO TRANSFUSE Performed at [facility], [address], [place], [place] [address]   CBC     Status: Abnormal   Collection Time: 09/22/23  8:39 AM  Result Value Ref Range   WBC 9.4 4.0 - 10.5 K/uL   RBC 4.07 3.87 - 5.11 MIL/uL   Hemoglobin 11.5 (L) 12.0 - 15.0 g/dL   HCT 65.0 (L) 63.9 - 53.9 %   MCV 85.7 80.0 - 100.0 fL   MCH 28.3 26.0 - 34.0 pg   MCHC 33.0 30.0 - 36.0 g/dL   RDW 84.8 88.4 - 84.4 %   Platelets 234 150 - 400 K/uL   nRBC 0.0 0.0 - 0.2 %    Imaging:  US  OB Comp Less 14 Wks Result Date: 09/22/2023 CLINICAL DATA:  Vaginal bleeding. Beta hCG is not available at the time of interpretation. EXAM: OBSTETRIC <14 WK ULTRASOUND TECHNIQUE: Transabdominal ultrasound was performed for evaluation of the gestation as well as the maternal uterus and adnexal regions. COMPARISON:  None Available. FINDINGS: Intrauterine gestational  sac: Single Yolk sac:  Visualized. Embryo:  Visualized. Cardiac Activity: Visualized. Heart Rate: 175 bpm CRL:   60.5 mm   12 w 4 d                  US  EDC: 04/01/2024 Subchorionic hemorrhage:  None visualized. Maternal uterus/adnexae: Normal left ovary. Right ovary is not seen. IMPRESSION: Single intrauterine pregnancy with cardiac activity measures 12 weeks 4 days' gestation by crown-rump length. Electronically Signed   By: Limin  Xu M.D.   On: 09/22/2023 09:39   MDM & MAU COURSE  MDM: High  MAU Course: -BP slightly elevated, consistent with cHTN. Otherwise vital signs within normal limits. - CBC, ultrasound, blood type, UA, wet prep workup for first trimester bleeding. - CBC with mild anemia hemoglobin 11.5. -Blood type O-.  At 12 weeks 3 days, RhoGAM is still recommended.  Discussed with Dr. Eldonna, agrees with administering RhoGAM today. -UA and wet prep negative for infection. -Ultrasound with single IUP, fetal heart rate 175 bpm, CRL consistent with 12 weeks 4 days gestation.  Differential diagnosis considered for 1st trimester vaginal bleeding includes but is not limited to: ectopic pregnancy, complete spontaneous abortion, incomplete abortion, missed abortion, threatened abortion   Orders Placed This Encounter  Procedures   Wet prep, genital   US  OB Comp Less 14 Wks   Urinalysis, Routine w reflex microscopic -Urine, Clean Catch   CBC   Rh IG workup (includes ABO/Rh)   Discharge patient   Meds ordered this encounter  Medications   rho (d) immune globulin  (RHIG/RHOPHYLAC ) injection 300 mcg    ASSESSMENT   1. Vaginal bleeding in pregnancy, first trimester   2.  RhD negative   3. Anemia during pregnancy in first trimester   4. [redacted] weeks gestation of pregnancy     PLAN  Discharge home in stable condition with return precautions.  Discussed physiology of anemia of pregnancy, recommended additional iron.  Provided information in AVS about different formulations in addition to  continuing prenatal vitamin. Discussed indication for RhoGAM due to blood type and bleeding at 12 weeks 3 days.  Patient agreeable to receiving this dose today.  Follow-up Information     [facility] Follow up.   Why: As scheduled for ongoing prenatal care Contact information: [address] [place] [address] [phone]                  Allergies as of 09/22/2023       Reactions   Imitrex [sumatriptan]    Pt reports heart palpitations         Medication List     TAKE these medications    amLODipine  5 MG tablet Commonly known as: NORVASC  Take 1 tablet (5 mg total) by mouth daily.   Contrave  8-90 MG Tb12 Generic drug: Naltrexone-buPROPion HCl ER Take 1 tablet by mouth daily for 7 days then 1 twice a day for 7 days, then 2 in the am and 1 in the pm for 7 days then take 2 twice daily thereafter.   famotidine  20 MG tablet Commonly known as: PEPCID  Take 20 mg by mouth at bedtime.   hydrOXYzine  25 MG capsule Commonly known as: VISTARIL  Take 1 capsule (25 mg total) by mouth every 8 (eight) hours as needed.   ofloxacin  0.3 % ophthalmic solution Commonly known as: OCUFLOX  Instill 2 drops in affected eyes every 4 hours for the first 2 days, then instill 2 drops 4 times daily for an additional 5 days   sertraline  100 MG tablet Commonly known as: ZOLOFT  TAKE 1 TABLET(100 MG) BY MOUTH DAILY   traZODone  50 MG tablet Commonly known as: DESYREL  Take 0.5-1 tablets (25-50 mg total) by mouth at bedtime as needed for sleep.        Joesph DELENA Sear, PA

## 2023-09-22 NOTE — Discharge Instructions (Signed)
  ANEMIA: If you have a sensitive stomach, you can try a supplement called Blood Builder in addition to your prenatal vitamin. It is available via [employer] and also at stores like [employer]. It uses plant based iron and is less prone to causing stomach and bowel upset. Here is a screen shot of the supplement.   Alternatively, you can start a regular iron supplement in addition to your prenatal vitamin. Either ferrous sulfate, ferrous gluconate, or ferrous fumarate. The recommendation is 100 mg every other day. These can all be found over-the-counter.  For some people this can be constipating, so you can also add a daily stool softener (Miralax) if needed.  About 10% of people will have difficulty with iron and it will cause GI upset. To reduce the chance of GI upset, take the supplement with food. Taking it at night so you sleep through potential side effects can also be helpful. If that does not help, taking a liquid formulation with food can help cause less GI symptoms.  Reasons to return to MAU at [facility]: If you have heavier bleeding that soaks through more that 2 pads per hour for an hour or more If you bleed so much that you feel like you might pass out or you do pass out If you have significant abdominal pain that is not improved with Tylenol  1000 mg every 8 hours as needed for pain If you develop a fever > 100.5

## 2023-09-22 NOTE — MAU Note (Signed)
..  Angelica Spencer is a 33 y.o. at [redacted]w[redacted]d here in MAU reporting: 0615 this morning she woke up and noticed a light amount of bright red bleeding. She states that she has not had recent intercourse or an abdominal cramping. Has had a confirmed IUP at [facility]. Takes 100mg  BID labetalol  for CHTN. Has not taken dose yet this AM.   Pain score: 0 Vitals:   09/22/23 0744  BP: (!) 137/92  Pulse: 66  Resp: 14  Temp: 98.4 F (36.9 C)  SpO2: 100%     FHT:165 Lab orders placed from triage:   UA

## 2023-09-23 LAB — RH IG WORKUP (INCLUDES ABO/RH)
ABO/RH(D): O NEG
Antibody Screen: NEGATIVE
Gestational Age(Wks): 12
Unit division: 0

## 2023-09-24 LAB — GC/CHLAMYDIA PROBE AMP (~~LOC~~) NOT AT ARMC
Chlamydia: NEGATIVE
Comment: NEGATIVE
Comment: NORMAL
Neisseria Gonorrhea: NEGATIVE

## 2023-10-12 DIAGNOSIS — Z3A15 15 weeks gestation of pregnancy: Secondary | ICD-10-CM | POA: Diagnosis not present

## 2023-10-12 DIAGNOSIS — R109 Unspecified abdominal pain: Secondary | ICD-10-CM | POA: Diagnosis not present

## 2023-10-12 DIAGNOSIS — O26892 Other specified pregnancy related conditions, second trimester: Secondary | ICD-10-CM | POA: Diagnosis not present

## 2023-11-12 DIAGNOSIS — O09292 Supervision of pregnancy with other poor reproductive or obstetric history, second trimester: Secondary | ICD-10-CM | POA: Diagnosis not present

## 2023-11-12 DIAGNOSIS — O99810 Abnormal glucose complicating pregnancy: Secondary | ICD-10-CM | POA: Diagnosis not present

## 2023-11-12 DIAGNOSIS — O26892 Other specified pregnancy related conditions, second trimester: Secondary | ICD-10-CM | POA: Diagnosis not present

## 2023-11-12 DIAGNOSIS — Z363 Encounter for antenatal screening for malformations: Secondary | ICD-10-CM | POA: Diagnosis not present

## 2023-11-12 DIAGNOSIS — Z3A19 19 weeks gestation of pregnancy: Secondary | ICD-10-CM | POA: Diagnosis not present

## 2023-11-12 DIAGNOSIS — Z361 Encounter for antenatal screening for raised alphafetoprotein level: Secondary | ICD-10-CM | POA: Diagnosis not present

## 2023-11-22 DIAGNOSIS — O99810 Abnormal glucose complicating pregnancy: Secondary | ICD-10-CM | POA: Diagnosis not present

## 2023-11-22 DIAGNOSIS — Z3A21 21 weeks gestation of pregnancy: Secondary | ICD-10-CM | POA: Diagnosis not present

## 2023-11-26 ENCOUNTER — Encounter: Payer: Self-pay | Admitting: Nurse Practitioner

## 2023-12-13 DIAGNOSIS — I10 Essential (primary) hypertension: Secondary | ICD-10-CM | POA: Diagnosis not present

## 2023-12-13 DIAGNOSIS — Z3A24 24 weeks gestation of pregnancy: Secondary | ICD-10-CM | POA: Diagnosis not present

## 2023-12-18 DIAGNOSIS — B3731 Acute candidiasis of vulva and vagina: Secondary | ICD-10-CM | POA: Diagnosis not present

## 2023-12-18 DIAGNOSIS — N760 Acute vaginitis: Secondary | ICD-10-CM | POA: Diagnosis not present

## 2023-12-18 DIAGNOSIS — Z3A24 24 weeks gestation of pregnancy: Secondary | ICD-10-CM | POA: Diagnosis not present

## 2023-12-18 DIAGNOSIS — O10012 Pre-existing essential hypertension complicating pregnancy, second trimester: Secondary | ICD-10-CM | POA: Diagnosis not present

## 2023-12-19 DIAGNOSIS — J00 Acute nasopharyngitis [common cold]: Secondary | ICD-10-CM | POA: Diagnosis not present

## 2024-01-08 DIAGNOSIS — O99019 Anemia complicating pregnancy, unspecified trimester: Secondary | ICD-10-CM | POA: Insufficient documentation

## 2024-01-08 DIAGNOSIS — D649 Anemia, unspecified: Secondary | ICD-10-CM | POA: Insufficient documentation

## 2024-01-08 LAB — OB RESULTS CONSOLE ANTIBODY SCREEN: Antibody Screen: NEGATIVE

## 2024-01-25 ENCOUNTER — Inpatient Hospital Stay (HOSPITAL_COMMUNITY)
Admission: AD | Admit: 2024-01-25 | Discharge: 2024-01-25 | Disposition: A | Discharge status: Home / Self Care | Payer: PRIVATE HEALTH INSURANCE | Attending: Family Medicine | Admitting: Family Medicine

## 2024-01-25 ENCOUNTER — Encounter (HOSPITAL_COMMUNITY): Payer: Self-pay | Admitting: Obstetrics

## 2024-01-25 ENCOUNTER — Other Ambulatory Visit: Payer: Self-pay

## 2024-01-25 DIAGNOSIS — O368130 Decreased fetal movements, third trimester, not applicable or unspecified: Secondary | ICD-10-CM

## 2024-01-25 DIAGNOSIS — O4703 False labor before 37 completed weeks of gestation, third trimester: Secondary | ICD-10-CM | POA: Diagnosis not present

## 2024-01-25 DIAGNOSIS — Z3689 Encounter for other specified antenatal screening: Secondary | ICD-10-CM | POA: Diagnosis not present

## 2024-01-25 DIAGNOSIS — Z3A30 30 weeks gestation of pregnancy: Secondary | ICD-10-CM

## 2024-01-25 NOTE — MAU Provider Note (Signed)
 History     CSN: [id number]  Arrival date and time: 01/25/24 2152   Event Date/Time   First Provider Initiated Contact with Patient 01/25/24 2308      Chief Complaint  Patient presents with  . Decreased Fetal Movement   Angelica Spencer is a 33 y.o. H4E7977 at [redacted]w[redacted]d who receives care at [facility].  She presents today for DFM.  Patient reports that she has noted ~ 4x today.  She states she was driving home and realized she hadn't felt movement since around 10am. She states she tried cold water, cola, poking her abdomen, and putting her feet up with only 2 m/m noted. She reports drinking around 32 ounces of water today.  She denies LOF or VB, but reports some mucoid discharge prior to arrival.  She also reports BH contractions, but no cramping.  No issues with urination, constipation, or diarrhea.    OB History     Gravida  5   Para  2   Term  2   Preterm      AB  2   Living  2      SAB  2   IAB      Ectopic      Multiple  0   Live Births  2           Past Medical History:  Diagnosis Date  . Anxiety   . Depression   . Endometriosis   . GERD (gastroesophageal reflux disease)   . History of hypothyroidism    per pt dx 2019 meds until 2020, no meds since , normalized  . Hyperlipidemia   . Hypertension    dr c. schumann/  dr k. tobb  . Migraines   . NSVT (nonsustained ventricular tachycardia) [facility])    cardiologist--- dr [place]. schumann  . OSA (obstructive sleep apnea)    followed by dr t. turner;   study done in epic 02-02-20245 moderate , (09-04-2022  per pt did get titrated for cpap but now waiting on medical supply company)  . PCOS (polycystic ovarian syndrome)   . Pre-diabetes   . Wears contact lenses     Past Surgical History:  Procedure Laterality Date  . BREAST REDUCTION SURGERY Bilateral 12/21/2021   Procedure: MAMMARY REDUCTION  (BREAST) POSSIBLE LIPOSUCTION;  Surgeon: Lowery Estefana RAMAN, DO;  Location: [facility];   Service: Plastics;  Laterality: Bilateral;  . DILATION AND EVACUATION N/A 09/06/2022   Procedure: DILATATION AND EVACUATION (D&E) 2ND TRIMESTER;  Surgeon: Gorge Ade, MD;  Location: [facility];  Service: Gynecology;  Laterality: N/A;  . OPERATIVE ULTRASOUND N/A 09/06/2022   Procedure: OPERATIVE ULTRASOUND;  Surgeon: Gorge Ade, MD;  Location: [facility];  Service: Gynecology;  Laterality: N/A;  . ROBOTIC ASSISTED DIAGNOSTIC LAPAROSCOPY  09/27/2017   @ [facility] by dr a. simon;  EXCISION ENDOMETRIOSIS/ BILATERAL OVARIAN WEDGE RESECTION/  CHROMPERTUBATION/  HYSTEROSCOPIC UTERINE SEPTUM RESECTION W/ VERSAPOINT    Family History  Problem Relation Age of Onset  . Cancer Mother        non-hodgkins lymphoma  . Hyperlipidemia Father   . Hypertension Father   . Anxiety disorder Sister   . Depression Sister   . Bipolar disorder Sister   . Cancer Maternal Grandmother        hodgkins lymphoma  . COPD Paternal Grandmother   . Diabetes Paternal Grandmother   . Obesity Paternal Grandfather     Social History   Tobacco Use  .  Smoking status: Never  . Smokeless tobacco: Never  Vaping Use  . Vaping status: Never Used  Substance Use Topics  . Alcohol use: Not Currently  . Drug use: Never    Allergies:  Allergies  Allergen Reactions  . Imitrex [Sumatriptan]     Pt reports heart palpitations     Medications Prior to Admission  Medication Sig Dispense Refill Last Dose/Taking  . aspirin  EC 81 MG tablet Take 81 mg by mouth daily. Swallow whole.   01/25/2024  . famotidine  (PEPCID ) 20 MG tablet Take 40 mg by mouth at bedtime.   01/24/2024  . labetalol  (NORMODYNE ) 100 MG tablet Take 100 mg by mouth 2 (two) times daily.   01/25/2024  . Lactobacillus (AZO VAGINAL HEALTH PROBIOTIC PO) Take by mouth.   01/24/2024  . Prenatal Vit-Fe Fumarate-FA (PRENATAL MULTIVITAMIN) TABS tablet Take 1 tablet by mouth daily at 12 noon.   01/25/2024  . sertraline  (ZOLOFT ) 100 MG  tablet TAKE 1 TABLET(100 MG) BY MOUTH DAILY 90 tablet 1 01/25/2024  . amLODipine  (NORVASC ) 5 MG tablet Take 1 tablet (5 mg total) by mouth daily. (Patient not taking: Reported on 01/25/2024) 90 tablet 1 Not Taking  . hydrOXYzine  (VISTARIL ) 25 MG capsule Take 1 capsule (25 mg total) by mouth every 8 (eight) hours as needed. (Patient not taking: Reported on 01/25/2024) 30 capsule 0 Not Taking  . Naltrexone-buPROPion HCl ER (CONTRAVE ) 8-90 MG TB12 Take 1 tablet by mouth daily for 7 days then 1 twice a day for 7 days, then 2 in the am and 1 in the pm for 7 days then take 2 twice daily thereafter. (Patient not taking: Reported on 01/25/2024) 120 tablet 0 Not Taking  . ofloxacin  (OCUFLOX ) 0.3 % ophthalmic solution Instill 2 drops in affected eyes every 4 hours for the first 2 days, then instill 2 drops 4 times daily for an additional 5 days (Patient not taking: Reported on 01/25/2024) 5 mL 0 Not Taking  . traZODone  (DESYREL ) 50 MG tablet Take 0.5-1 tablets (25-50 mg total) by mouth at bedtime as needed for sleep. (Patient not taking: Reported on 01/25/2024) 30 tablet 2 Not Taking    Review of Systems  Gastrointestinal:  Negative for abdominal pain, constipation, diarrhea, nausea and vomiting.  Genitourinary:  Negative for difficulty urinating, dysuria, vaginal bleeding and vaginal discharge.   Physical Exam   Blood pressure 132/78, pulse 92, temperature 98.2 F (36.8 C), temperature source Oral, resp. rate 12, height 5' 3 (1.6 m), weight 100.2 kg, last menstrual period 06/27/2023, SpO2 99%.  Physical Exam Vitals reviewed.  Constitutional:      Appearance: Normal appearance.  HENT:     Head: Normocephalic and atraumatic.  Eyes:     Conjunctiva/sclera: Conjunctivae normal.  Cardiovascular:     Rate and Rhythm: Normal rate.     Heart sounds: Normal heart sounds.  Pulmonary:     Effort: Pulmonary effort is normal. No respiratory distress.     Breath sounds: Normal breath sounds.  Abdominal:      Comments: Gravid, Appears LGA  Musculoskeletal:        General: Normal range of motion.     Cervical back: Normal range of motion.  Skin:    General: Skin is warm and dry.  Neurological:     Mental Status: She is alert and oriented to person, place, and time.  Psychiatric:        Mood and Affect: Mood normal.        Behavior: Behavior normal.  Fetal Assessment 135 bpm, Mod Var, -Decels, +15x15 Accels Toco: No ctx graphed  MAU Course  No results found for this or any previous visit (from the past 24 hours). No results found.   MDM PE Labs: None EFM  Assessment and Plan   33 year old [id number]  SIUP at 30.2 weeks Cat I FT DFM  -Patient report fetal movement since arrival.  -Exam performed.  - -Will monitor and discharge home with continued good moving.    Harlene LITTIE Duncans MSN, CNM 01/25/2024,   Reassessment (11:08 PM) -NST remains reactive.  -Nurse to give precautions. -Keep next appt as scheduled. -Discharged to home in stable condition.  Harlene LITTIE Duncans MSN, CNM Advanced Practice Provider, [facility] [employer]

## 2024-01-25 NOTE — MAU Note (Signed)
 Pt says baby has moved 4x today - and usually very active . [facility]- [facility]- did not call office -  Occ UC's - Next appointment -02-07-2024

## 2024-01-31 ENCOUNTER — Other Ambulatory Visit: Payer: Self-pay | Admitting: Medical Genetics

## 2024-01-31 DIAGNOSIS — Z006 Encounter for examination for normal comparison and control in clinical research program: Secondary | ICD-10-CM

## 2024-02-20 ENCOUNTER — Inpatient Hospital Stay (HOSPITAL_COMMUNITY)
Admission: AD | Admit: 2024-02-20 | Discharge: 2024-02-20 | Disposition: A | Discharge status: Home / Self Care | Payer: PRIVATE HEALTH INSURANCE | Attending: Obstetrics and Gynecology | Admitting: Obstetrics and Gynecology

## 2024-02-20 ENCOUNTER — Inpatient Hospital Stay (HOSPITAL_COMMUNITY): Payer: PRIVATE HEALTH INSURANCE

## 2024-02-20 ENCOUNTER — Encounter (HOSPITAL_COMMUNITY): Payer: Self-pay | Admitting: Obstetrics

## 2024-02-20 ENCOUNTER — Other Ambulatory Visit: Payer: Self-pay

## 2024-02-20 DIAGNOSIS — H43399 Other vitreous opacities, unspecified eye: Secondary | ICD-10-CM | POA: Diagnosis present

## 2024-02-20 DIAGNOSIS — R0602 Shortness of breath: Secondary | ICD-10-CM | POA: Insufficient documentation

## 2024-02-20 DIAGNOSIS — O26893 Other specified pregnancy related conditions, third trimester: Secondary | ICD-10-CM | POA: Diagnosis not present

## 2024-02-20 DIAGNOSIS — O10013 Pre-existing essential hypertension complicating pregnancy, third trimester: Secondary | ICD-10-CM | POA: Diagnosis not present

## 2024-02-20 DIAGNOSIS — I10 Essential (primary) hypertension: Secondary | ICD-10-CM

## 2024-02-20 DIAGNOSIS — R42 Dizziness and giddiness: Secondary | ICD-10-CM | POA: Diagnosis present

## 2024-02-20 DIAGNOSIS — Z3A34 34 weeks gestation of pregnancy: Secondary | ICD-10-CM | POA: Insufficient documentation

## 2024-02-20 DIAGNOSIS — R079 Chest pain, unspecified: Secondary | ICD-10-CM | POA: Diagnosis not present

## 2024-02-20 DIAGNOSIS — R0609 Other forms of dyspnea: Secondary | ICD-10-CM | POA: Diagnosis present

## 2024-02-20 LAB — CBC
HCT: 30.3 % — ABNORMAL LOW (ref 36.0–46.0)
Hemoglobin: 9.9 g/dL — ABNORMAL LOW (ref 12.0–15.0)
MCH: 27.2 pg (ref 26.0–34.0)
MCHC: 32.7 g/dL (ref 30.0–36.0)
MCV: 83.2 fL (ref 80.0–100.0)
Platelets: 228 K/uL (ref 150–400)
RBC: 3.64 MIL/uL — ABNORMAL LOW (ref 3.87–5.11)
RDW: 14.7 % (ref 11.5–15.5)
WBC: 10.4 K/uL (ref 4.0–10.5)
nRBC: 0 % (ref 0.0–0.2)

## 2024-02-20 LAB — PROTEIN / CREATININE RATIO, URINE
Creatinine, Urine: 112 mg/dL
Protein Creatinine Ratio: 0.05 mg/mg{creat} (ref 0.00–0.15)
Total Protein, Urine: 6 mg/dL

## 2024-02-20 LAB — COMPREHENSIVE METABOLIC PANEL WITH GFR
ALT: 9 U/L (ref 0–44)
AST: 13 U/L — ABNORMAL LOW (ref 15–41)
Albumin: 2.5 g/dL — ABNORMAL LOW (ref 3.5–5.0)
Alkaline Phosphatase: 102 U/L (ref 38–126)
Anion gap: 12 (ref 5–15)
BUN: 8 mg/dL (ref 6–20)
CO2: 21 mmol/L — ABNORMAL LOW (ref 22–32)
Calcium: 9.2 mg/dL (ref 8.9–10.3)
Chloride: 103 mmol/L (ref 98–111)
Creatinine, Ser: 0.52 mg/dL (ref 0.44–1.00)
GFR, Estimated: >60 mL/min (ref >60)
Glucose, Bld: 125 mg/dL — ABNORMAL HIGH (ref 70–99)
Potassium: 3.8 mmol/L (ref 3.5–5.1)
Sodium: 136 mmol/L (ref 135–145)
Total Bilirubin: 0.5 mg/dL (ref 0.0–1.2)
Total Protein: 6.1 g/dL — ABNORMAL LOW (ref 6.5–8.1)

## 2024-02-20 LAB — D-DIMER, QUANTITATIVE: D-Dimer, Quant: 1.54 ug{FEU}/mL — ABNORMAL HIGH (ref 0.00–0.50)

## 2024-02-20 LAB — BRAIN NATRIURETIC PEPTIDE: B Natriuretic Peptide: 25.5 pg/mL (ref 0.0–100.0)

## 2024-02-20 LAB — TROPONIN I (HIGH SENSITIVITY): Troponin I (High Sensitivity): 3 ng/L (ref <18)

## 2024-02-20 MED ORDER — ACETAMINOPHEN-CAFFEINE 500-65 MG PO TABS
2.0000 | ORAL_TABLET | Freq: Once | ORAL | Status: AC
  Administered 2024-02-20: 2 via ORAL
  Filled 2024-02-20: qty 2

## 2024-02-20 MED ORDER — LABETALOL HCL 100 MG PO TABS: 200.0000 mg | ORAL_TABLET | Freq: Three times a day (TID) | ORAL | Status: DC

## 2024-02-20 MED ORDER — ONDANSETRON 4 MG PO TBDP
8.0000 mg | ORAL_TABLET | Freq: Once | ORAL | Status: AC
  Administered 2024-02-20: 8 mg via ORAL
  Filled 2024-02-20: qty 2

## 2024-02-20 NOTE — Discharge Instructions (Signed)
 Reasons to return to MAU at [facility]: Your blood pressure is >160/110. You have a headache that will not go away after having something to eat, something to drink, and Tylenol . You have changes in your vision or upper abdominal pain that does not get better with Tylenol . Less than 36 weeks: Contractions feels like menstrual cramps. You should go to the hospital if you have more than 6 contractions in an hour, even after you have rested and drank at least 16 ounces of water.  More than 36 weeks: You begin to have strong, frequent contractions 5 minutes apart or less, each last 1 minute, these have been going on for 1-2 hours, and you cannot walk or talk during them. Your water breaks.  Sometimes it is a big gush of fluid. However, many times it may it may be much more subtle. You should go to the hospital if you have a constant leakage of fluid from your vagina, enough to soak a pad when you are walking around.  You have vaginal bleeding.  It is normal to have a small amount of spotting if your cervix was checked. If you have bleeding requiring the use of a pad, go to the hospital. You don't feel your baby moving like normal.  If you think that you baby's movement is decreased, eat a snack and rest on your left side in a quiet room for one hour. If you have not felt the baby move more than 6 times in an hour GO TO THE HOSPITAL.

## 2024-02-20 NOTE — MAU Note (Signed)
 MAU Triage Note  .Angelica Spencer is a 33 y.o. at [redacted]w[redacted]d here in MAU reporting: reports she started not feeling well about an hour ago; reports she started feeling lightheaded, dizzy, has difficulty catching her breath, and also has floaters in her vision. Denies epigastric pain, VB and LOF. Endorses +FM.    Onset of complaint: approx 1 hr ago Pain score: HA 2/10 Vitals:   02/20/24 1527  BP: (!) 138/90  Pulse: (!) 134  Resp: 15  Temp: 98.2 F (36.8 C)  SpO2: 99%     FHT: 138  Lab orders placed from triage: EKG; urine collected

## 2024-02-20 NOTE — MAU Provider Note (Addendum)
 Chief Complaint:  Headache, Hypertension, and Dizziness   HPI   None     Angelica Spencer is a 33 y.o. [id number] at [redacted]w[redacted]d who presents to maternity admissions reporting SOB, floaters, headache, lightheadedness/dizziness, and nausea. She reports that she suddenly started feeling unwell at around 1430 and has not felt well for the past hour since then. She had something to drink and eat with no improvement. Headache is 2/10. Denies epigastric pain, vaginal bleeding, and leaking of fluid. Endorses good fetal movement. Denies chest pain, weakness, ear pain or pressure. Her dose of labetalol  200 mg this morning was taken at 0630.  Additional history obtained from partner  Pregnancy Course: Receives care at [facility]. Prenatal records reviewed. Pregnancy complicated by chronic HTN, anemia, and anxiety. BP has been well controlled this pregnancy.  Past Medical History:  Diagnosis Date   Anxiety    Depression    Endometriosis    GERD (gastroesophageal reflux disease)    History of hypothyroidism    per pt dx 2019 meds until 2020, no meds since , normalized   Hyperlipidemia    Hypertension    dr c. schumann/  dr marla. tobb   Migraines    NSVT (nonsustained ventricular tachycardia) [facility])    cardiologist--- dr [place]. schumann   OSA (obstructive sleep apnea)    followed by dr t. shlomo;   study done in epic 02-02-20245 moderate , (09-04-2022  per pt did get titrated for cpap but now waiting on medical supply company)   PCOS (polycystic ovarian syndrome)    Pre-diabetes    Wears contact lenses    OB History  Gravida Para Term Preterm AB Living  5 2 2  2 2   SAB IAB Ectopic Multiple Live Births  2   0 2    # Outcome Date GA Lbr Len/2nd Weight Sex Type Anes PTL Lv  5 Current           4 SAB 09/2022 [redacted]w[redacted]d         3 Term 09/03/20 [redacted]w[redacted]d 01:22 / 00:31 3521 g M Vag-Spont EPI  LIV  2 Term 2020     Vag-Spont   LIV  1 SAB  [redacted]w[redacted]d          Past Surgical History:  Procedure Laterality Date   BREAST  REDUCTION SURGERY Bilateral 12/21/2021   Procedure: MAMMARY REDUCTION  (BREAST) POSSIBLE LIPOSUCTION;  Surgeon: Lowery Estefana RAMAN, DO;  Location: [facility];  Service: Plastics;  Laterality: Bilateral;   DILATION AND EVACUATION N/A 09/06/2022   Procedure: DILATATION AND EVACUATION (D&E) 2ND TRIMESTER;  Surgeon: Gorge Ade, MD;  Location: [facility];  Service: Gynecology;  Laterality: N/A;   OPERATIVE ULTRASOUND N/A 09/06/2022   Procedure: OPERATIVE ULTRASOUND;  Surgeon: Gorge Ade, MD;  Location: [facility];  Service: Gynecology;  Laterality: N/A;   ROBOTIC ASSISTED DIAGNOSTIC LAPAROSCOPY  09/27/2017   @ [facility] by dr a. simon;  EXCISION ENDOMETRIOSIS/ BILATERAL OVARIAN WEDGE RESECTION/  CHROMPERTUBATION/  HYSTEROSCOPIC UTERINE SEPTUM RESECTION W/ VERSAPOINT   Family History  Problem Relation Age of Onset   Cancer Mother        non-hodgkins lymphoma   Hyperlipidemia Father    Hypertension Father    Anxiety disorder Sister    Depression Sister    Bipolar disorder Sister    Cancer Maternal Grandmother        hodgkins lymphoma   COPD Paternal Grandmother    Diabetes Paternal Grandmother    Obesity  Paternal Grandfather    Social History   Tobacco Use   Smoking status: Never   Smokeless tobacco: Never  Vaping Use   Vaping status: Never Used  Substance Use Topics   Alcohol use: Not Currently   Drug use: Never   Allergies  Allergen Reactions   Imitrex [Sumatriptan]     Pt reports heart palpitations    No medications prior to admission.    I have reviewed patient's Past Medical Hx, Surgical Hx, Family Hx, Social Hx, medications and allergies.   ROS  Pertinent items noted in HPI and remainder of comprehensive ROS otherwise negative.   PHYSICAL EXAM  Patient Vitals for the past 24 hrs:  BP Temp Temp src Pulse Resp SpO2  02/20/24 1831 129/86 -- -- 100 -- --  02/20/24 1801 135/82 -- -- 90 -- --  02/20/24 1746 139/85 -- --  97 -- --  02/20/24 1731 132/88 -- -- 96 -- --  02/20/24 1716 127/85 -- -- (!) 101 -- --  02/20/24 1703 139/83 -- -- (!) 101 -- --  02/20/24 1646 (!) 127/97 -- -- (!) 103 -- --  02/20/24 1631 (!) 136/95 -- -- (!) 121 -- --  02/20/24 1616 (!) 133/99 -- -- (!) 117 -- --  02/20/24 1601 (!) 138/94 -- -- (!) 127 -- --  02/20/24 1550 (!) 142/92 -- -- (!) 115 -- 98 %  02/20/24 1527 (!) 138/90 98.2 F (36.8 C) Oral (!) 134 15 99 %    Constitutional: Well-developed, well-nourished female in no acute distress.  HEENT: atraumatic, normocephalic. Neck has normal ROM. EOM intact. Cardiovascular: normal rate & rhythm, warm and well-perfused. No murmurs, rubs, or gallops Respiratory: normal effort, no problems with respiration noted. CTA bilaterally, no decreased breath sounds. GI: Abd soft, non-tender, non-distended MSK: Extremities nontender, no edema, normal ROM Skin: warm and dry. Acyanotic, no jaundice or pallor. Neurologic: Alert and oriented x 4. No abnormal coordination. Psychiatric: Normal mood. Speech not slurred, not rapid/pressured. Patient is cooperative.  Fetal Tracing: Baseline FHR: 135 per minute Fetal heart variability: moderate Fetal Heart Rate accelerations: yes Fetal Heart Rate decelerations: none Fetal Non-stress Test: Category I (reactive) Toco: no uterine contractions   Labs: Results for orders placed or performed during the hospital encounter of 02/20/24 (from the past 24 hours)  Protein / creatinine ratio, urine     Status: None   Collection Time: 02/20/24  3:00 PM  Result Value Ref Range   Creatinine, Urine 112 mg/dL   Total Protein, Urine 6 mg/dL   Protein Creatinine Ratio 0.05 0.00 - 0.15 mg/mg[Cre]  CBC     Status: Abnormal   Collection Time: 02/20/24  3:34 PM  Result Value Ref Range   WBC 10.4 4.0 - 10.5 K/uL   RBC 3.64 (L) 3.87 - 5.11 MIL/uL   Hemoglobin 9.9 (L) 12.0 - 15.0 g/dL   HCT 69.6 (L) 63.9 - 53.9 %   MCV 83.2 80.0 - 100.0 fL   MCH 27.2 26.0 -  34.0 pg   MCHC 32.7 30.0 - 36.0 g/dL   RDW 85.2 88.4 - 84.4 %   Platelets 228 150 - 400 K/uL   nRBC 0.0 0.0 - 0.2 %  Comprehensive metabolic panel     Status: Abnormal   Collection Time: 02/20/24  3:34 PM  Result Value Ref Range   Sodium 136 135 - 145 mmol/L   Potassium 3.8 3.5 - 5.1 mmol/L   Chloride 103 98 - 111 mmol/L   CO2 21 (L)  22 - 32 mmol/L   Glucose, Bld 125 (H) 70 - 99 mg/dL   BUN 8 6 - 20 mg/dL   Creatinine, Ser 9.47 0.44 - 1.00 mg/dL   Calcium 9.2 8.9 - 89.6 mg/dL   Total Protein 6.1 (L) 6.5 - 8.1 g/dL   Albumin 2.5 (L) 3.5 - 5.0 g/dL   AST 13 (L) 15 - 41 U/L   ALT 9 0 - 44 U/L   Alkaline Phosphatase 102 38 - 126 U/L   Total Bilirubin 0.5 0.0 - 1.2 mg/dL   GFR, Estimated >39 >39 mL/min   Anion gap 12 5 - 15  Brain natriuretic peptide     Status: None   Collection Time: 02/20/24  4:38 PM  Result Value Ref Range   B Natriuretic Peptide 25.5 0.0 - 100.0 pg/mL  D-dimer, quantitative     Status: Abnormal   Collection Time: 02/20/24  4:38 PM  Result Value Ref Range   D-Dimer, Quant 1.54 (H) 0.00 - 0.50 ug/mL-FEU  Troponin I (High Sensitivity)     Status: None   Collection Time: 02/20/24  4:38 PM  Result Value Ref Range   Troponin I (High Sensitivity) 3 <18 ng/L   Imaging:  DG Chest Portable 1 View Result Date: 02/20/2024 CLINICAL DATA:  Shortness of breath. EXAM: PORTABLE CHEST 1 VIEW COMPARISON:  None Available. FINDINGS: Mild cardiomegaly. Bilateral central interstitial prominence, could reflect pulmonary vascular congestion or atypical infection. No focal consolidation, pleural effusion, or pneumothorax. No acute osseous abnormality. IMPRESSION: Mild cardiomegaly with bilateral central interstitial prominence, which could reflect pulmonary vascular congestion or atypical infection. Electronically Signed   By: Harrietta Sherry M.D.   On: 02/20/2024 18:49    EKG: EKG: unchanged from previous tracings, sinus tachycardia. I personally reviewed this EKG for  intrepretation.  MDM & MAU COURSE  MDM: Moderate  MAU Course: -Mildly elevated BP, initial tachycardia at 134 bpm improved on recheck 15 minutes later to 108.  -EKG  and troponinto rule out ACS. -CMP, CBC, urine protein/creatinine ratio to rule out preeclampsia. BNP to rule out heart failure. Low suspicion for pulmonary embolism based on history and physical exam, so will order D-dimer rather than imaging. -EKG negative for STEMI. Troponin negative. -CBC with ongoing anemia, Hgb 9.9 compared to Hgb 10.2 on 10/7 at [facility] office. -CMP without significant abnormalities. -BNP and urine protein/creatinine ratio within normal limits. -D-dimer 1.54, but with negative BNP and Troponin pulmonary embolism is unlikely and slight elevation is likely a physiologic increase in D-dimer due to pregnancy. Will get CXR to confirm no acute infection or cardiopulmonary disease. -I personally reviewed CXR images, no evidence of pneumonia. -Radiologist noted possible vascular congestion, this should improve with improved BP control.   Differential diagnosis considered for headache includes but is not limited to: preeclampsia, tension headache, cluster, migraine, viral syndrome Differential diagnosis considered for shortness of breath includes but is not limited to: physiologic SOB of pregnancy, URI, asthma, anemia, valvular heart disease, CAD, anxiety/panic disorder, ACS, heart failure exacerbation, pulmonary embolism Differential diagnosis considered for dizziness includes but is not limited to: hypoglycemia, viral syndrome, cerumen impaction/foreign body, arrhythmia   Orders Placed This Encounter  Procedures   DG Chest Portable 1 View   CBC   Protein / creatinine ratio, urine   Comprehensive metabolic panel   Brain natriuretic peptide   D-dimer, quantitative   ED EKG   Discharge patient   Meds ordered this encounter  Medications   ondansetron  (ZOFRAN -ODT) disintegrating tablet 8 mg  acetaminophen -caffeine (EXCEDRIN TENSION HEADACHE) 500-65 MG per tablet 2 tablet   labetalol  (NORMODYNE ) 100 MG tablet    Sig: Take 2 tablets (200 mg total) by mouth 3 (three) times daily.    ASSESSMENT   1. SOB (shortness of breath)   2. Essential hypertension   3. [redacted] weeks gestation of pregnancy     PLAN  Discharge home in stable condition with return precautions.  Increase to labetalol  200 mg three times daily. Follow up with OB as scheduled tomorrow.     Allergies as of 02/20/2024       Reactions   Imitrex [sumatriptan]    Pt reports heart palpitations         Medication List     TAKE these medications    aspirin  EC 81 MG tablet Take 81 mg by mouth daily. Swallow whole.   AZO VAGINAL HEALTH PROBIOTIC PO Take by mouth.   famotidine  20 MG tablet Commonly known as: PEPCID  Take 40 mg by mouth at bedtime.   labetalol  100 MG tablet Commonly known as: NORMODYNE  Take 2 tablets (200 mg total) by mouth 3 (three) times daily. What changed: when to take this   prenatal multivitamin Tabs tablet Take 1 tablet by mouth daily at 12 noon.   sertraline  100 MG tablet Commonly known as: ZOLOFT  TAKE 1 TABLET(100 MG) BY MOUTH DAILY        Joesph DELENA Sear, PA

## 2024-02-22 ENCOUNTER — Other Ambulatory Visit (HOSPITAL_COMMUNITY): Payer: Self-pay | Admitting: Certified Nurse Midwife

## 2024-02-22 ENCOUNTER — Other Ambulatory Visit: Payer: Self-pay | Admitting: Certified Nurse Midwife

## 2024-02-22 ENCOUNTER — Other Ambulatory Visit: Payer: Self-pay | Admitting: Nurse Practitioner

## 2024-02-22 DIAGNOSIS — O99013 Anemia complicating pregnancy, third trimester: Secondary | ICD-10-CM

## 2024-02-22 NOTE — Telephone Encounter (Signed)
 Requesting: sertraline  (ZOLOFT ) 100 MG tablet  Last Visit: 03/20/2023 Next Visit: Visit date not found Last Refill: 05/15/2023 Due for an appointment. Will send in 30 day supply  Please Advise

## 2024-02-28 ENCOUNTER — Inpatient Hospital Stay (HOSPITAL_COMMUNITY)
Admission: AD | Admit: 2024-02-28 | Discharge: 2024-02-28 | Disposition: A | Discharge status: Home / Self Care | Payer: PRIVATE HEALTH INSURANCE | Attending: Obstetrics & Gynecology | Admitting: Obstetrics & Gynecology

## 2024-02-28 ENCOUNTER — Encounter (HOSPITAL_COMMUNITY): Payer: Self-pay | Admitting: Obstetrics & Gynecology

## 2024-02-28 DIAGNOSIS — O368130 Decreased fetal movements, third trimester, not applicable or unspecified: Secondary | ICD-10-CM | POA: Insufficient documentation

## 2024-02-28 DIAGNOSIS — Z3689 Encounter for other specified antenatal screening: Secondary | ICD-10-CM

## 2024-02-28 DIAGNOSIS — O10013 Pre-existing essential hypertension complicating pregnancy, third trimester: Secondary | ICD-10-CM | POA: Diagnosis not present

## 2024-02-28 DIAGNOSIS — Z3A35 35 weeks gestation of pregnancy: Secondary | ICD-10-CM | POA: Diagnosis not present

## 2024-02-28 DIAGNOSIS — O10919 Unspecified pre-existing hypertension complicating pregnancy, unspecified trimester: Secondary | ICD-10-CM

## 2024-02-28 LAB — COMPREHENSIVE METABOLIC PANEL WITH GFR
ALT: 9 U/L (ref 0–44)
AST: 13 U/L — ABNORMAL LOW (ref 15–41)
Albumin: 2.4 g/dL — ABNORMAL LOW (ref 3.5–5.0)
Alkaline Phosphatase: 112 U/L (ref 38–126)
Anion gap: 10 (ref 5–15)
BUN: 9 mg/dL (ref 6–20)
CO2: 21 mmol/L — ABNORMAL LOW (ref 22–32)
Calcium: 8.6 mg/dL — ABNORMAL LOW (ref 8.9–10.3)
Chloride: 104 mmol/L (ref 98–111)
Creatinine, Ser: 0.64 mg/dL (ref 0.44–1.00)
GFR, Estimated: >60 mL/min (ref >60)
Glucose, Bld: 94 mg/dL (ref 70–99)
Potassium: 3.9 mmol/L (ref 3.5–5.1)
Sodium: 135 mmol/L (ref 135–145)
Total Bilirubin: 0.3 mg/dL (ref 0.0–1.2)
Total Protein: 5.5 g/dL — ABNORMAL LOW (ref 6.5–8.1)

## 2024-02-28 LAB — CBC
HCT: 27.9 % — ABNORMAL LOW (ref 36.0–46.0)
Hemoglobin: 9.1 g/dL — ABNORMAL LOW (ref 12.0–15.0)
MCH: 27.1 pg (ref 26.0–34.0)
MCHC: 32.6 g/dL (ref 30.0–36.0)
MCV: 83 fL (ref 80.0–100.0)
Platelets: 229 K/uL (ref 150–400)
RBC: 3.36 MIL/uL — ABNORMAL LOW (ref 3.87–5.11)
RDW: 14.8 % (ref 11.5–15.5)
WBC: 9.7 K/uL (ref 4.0–10.5)
nRBC: 0 % (ref 0.0–0.2)

## 2024-02-28 LAB — URINALYSIS, ROUTINE W REFLEX MICROSCOPIC
Bilirubin Urine: NEGATIVE
Glucose, UA: NEGATIVE mg/dL
Hgb urine dipstick: NEGATIVE
Ketones, ur: NEGATIVE mg/dL
Leukocytes,Ua: NEGATIVE
Nitrite: NEGATIVE
Protein, ur: NEGATIVE mg/dL
Specific Gravity, Urine: 1.006 (ref 1.005–1.030)
pH: 7 (ref 5.0–8.0)

## 2024-02-28 LAB — PROTEIN / CREATININE RATIO, URINE
Creatinine, Urine: 43 mg/dL
Total Protein, Urine: <6 mg/dL

## 2024-02-28 NOTE — MAU Provider Note (Addendum)
 History     CSN: [id number]  Arrival date and time: 02/28/24 1718   Event Date/Time   First Provider Initiated Contact with Patient 02/28/24 1809      Chief Complaint  Patient presents with   Decreased Fetal Movement   Visual Disturbances   Hypertension   HPI [name] is a 33 y.o. year old [id number] female at [redacted]w[redacted]d weeks gestation who presents to MAU reporting DFM today. She reports she has only felt a few times. She is currently feeling lots of FM. She states, She is making me out to be crazy. She reports her BP has increased this week; in the 140s/100s & 130s/90s. She has cHTN and takes Labetalol  200 mg TID; LD was this morning and this afternoon. She reports floaters in her vision upon registration to MAU. She reports a dull H/A; rated 1/10. She has not taken any medications for the H/A. She reports facial and hand swelling yesterday, but none today. She denies epigastric pain, VB or LOF. She receives [facility] with [facility]; next appt is 03/04/2024. Her spouse is present and contributing to the history taking.    OB History     Gravida  5   Para  2   Term  2   Preterm      AB  2   Living  2      SAB  2   IAB      Ectopic      Multiple  0   Live Births  2           Past Medical History:  Diagnosis Date   Anxiety    Depression    Endometriosis    GERD (gastroesophageal reflux disease)    History of hypothyroidism    per pt dx 2019 meds until 2020, no meds since , normalized   Hyperlipidemia    Hypertension    dr c. schumann/  dr marla. tobb   Migraines    NSVT (nonsustained ventricular tachycardia) [facility])    cardiologist--- dr [place]. schumann   OSA (obstructive sleep apnea)    followed by dr t. turner;   study done in epic 02-02-20245 moderate , (09-04-2022  per pt did get titrated for cpap but now waiting on medical supply company)   PCOS (polycystic ovarian syndrome)    Pre-diabetes    Wears contact lenses     Past Surgical History:   Procedure Laterality Date   BREAST REDUCTION SURGERY Bilateral 12/21/2021   Procedure: MAMMARY REDUCTION  (BREAST) POSSIBLE LIPOSUCTION;  Surgeon: Lowery Estefana RAMAN, DO;  Location: [facility];  Service: Plastics;  Laterality: Bilateral;   DILATION AND EVACUATION N/A 09/06/2022   Procedure: DILATATION AND EVACUATION (D&E) 2ND TRIMESTER;  Surgeon: Gorge Ade, MD;  Location: [facility];  Service: Gynecology;  Laterality: N/A;   OPERATIVE ULTRASOUND N/A 09/06/2022   Procedure: OPERATIVE ULTRASOUND;  Surgeon: Gorge Ade, MD;  Location: [facility];  Service: Gynecology;  Laterality: N/A;   ROBOTIC ASSISTED DIAGNOSTIC LAPAROSCOPY  09/27/2017   @ [facility] by dr a. simon;  EXCISION ENDOMETRIOSIS/ BILATERAL OVARIAN WEDGE RESECTION/  CHROMPERTUBATION/  HYSTEROSCOPIC UTERINE SEPTUM RESECTION W/ VERSAPOINT    Family History  Problem Relation Age of Onset   Cancer Mother        non-hodgkins lymphoma   Hyperlipidemia Father    Hypertension Father    Anxiety disorder Sister    Depression Sister    Bipolar disorder Sister  Cancer Maternal Grandmother        hodgkins lymphoma   COPD Paternal Grandmother    Diabetes Paternal Grandmother    Obesity Paternal Grandfather     Social History   Tobacco Use   Smoking status: Never   Smokeless tobacco: Never  Vaping Use   Vaping status: Never Used  Substance Use Topics   Alcohol use: Not Currently   Drug use: Never    Allergies:  Allergies  Allergen Reactions   Imitrex [Sumatriptan]     Pt reports heart palpitations     Medications Prior to Admission  Medication Sig Dispense Refill Last Dose/Taking   ferrous sulfate 325 (65 FE) MG EC tablet Take 325 mg by mouth 3 (three) times daily with meals.   Taking   labetalol  (NORMODYNE ) 100 MG tablet Take 2 tablets (200 mg total) by mouth 3 (three) times daily.   02/28/2024 at  2:00 PM   aspirin  EC 81 MG tablet Take 81 mg by mouth daily. Swallow  whole.      famotidine  (PEPCID ) 20 MG tablet Take 40 mg by mouth at bedtime.      Lactobacillus (AZO VAGINAL HEALTH PROBIOTIC PO) Take by mouth.      Prenatal Vit-Fe Fumarate-FA (PRENATAL MULTIVITAMIN) TABS tablet Take 1 tablet by mouth daily at 12 noon.      sertraline  (ZOLOFT ) 100 MG tablet Take 1 tablet by mouth daily. 30 tablet 0     Review of Systems  Constitutional: Negative.   HENT: Negative.    Eyes:  Positive for visual disturbance (seeing floaters since arriving to MAU).  Respiratory: Negative.    Cardiovascular: Negative.   Gastrointestinal: Negative.   Endocrine: Negative.   Genitourinary: Negative.        DFM  Musculoskeletal: Negative.   Skin: Negative.   Allergic/Immunologic: Negative.   Neurological:  Positive for headaches (dull).  Hematological: Negative.   Psychiatric/Behavioral: Negative.     Physical Exam   Patient Vitals for the past 24 hrs:  BP Temp Temp src Pulse Resp SpO2 Height Weight  02/28/24 1916 (!) 148/92 -- -- 99 -- 100 % -- --  02/28/24 1900 (!) 139/92 -- -- 97 -- 99 % -- --  02/28/24 1846 (!) 149/98 -- -- 94 -- -- -- --  02/28/24 1830 (!) 143/93 -- -- 94 -- 99 % -- --  02/28/24 1816 (!) 144/98 -- -- 88 -- -- -- --  02/28/24 1801 (!) 143/96 -- -- 93 -- 99 % -- --  02/28/24 1744 (!) 135/95 98.2 F (36.8 C) Oral (!) 103 18 100 % -- --  02/28/24 1737 -- -- -- -- -- -- 5' 3 (1.6 m) 103.6 kg    Physical Exam Vitals and nursing note reviewed.  Constitutional:      Appearance: Normal appearance. She is obese.  Cardiovascular:     Rate and Rhythm: Normal rate and regular rhythm.     Heart sounds: Normal heart sounds.  Pulmonary:     Effort: Pulmonary effort is normal.     Breath sounds: Normal breath sounds.  Abdominal:     General: Bowel sounds are normal.     Palpations: Abdomen is soft.  Musculoskeletal:        General: Normal range of motion.  Skin:    General: Skin is warm and dry.  Neurological:     Mental Status: She is  alert and oriented to person, place, and time.  Psychiatric:  Mood and Affect: Mood normal.        Behavior: Behavior normal.        Thought Content: Thought content normal.        Judgment: Judgment normal.    REACTIVE NST - FHR: 135 bpm / moderate variability / accels present / decels absent / TOCO: 1 UC with UI noted  MAU Course  Procedures  MDM CCUA CBC CMP P/C Ratio Serial BP's   Results for orders placed or performed during the hospital encounter of 02/28/24 (from the past 24 hours)  Urinalysis, Routine w reflex microscopic -Urine, Clean Catch     Status: Abnormal   Collection Time: 02/28/24  5:49 PM  Result Value Ref Range   Color, Urine STRAW (A) YELLOW   APPearance HAZY (A) CLEAR   Specific Gravity, Urine 1.006 1.005 - 1.030   pH 7.0 5.0 - 8.0   Glucose, UA NEGATIVE NEGATIVE mg/dL   Hgb urine dipstick NEGATIVE NEGATIVE   Bilirubin Urine NEGATIVE NEGATIVE   Ketones, ur NEGATIVE NEGATIVE mg/dL   Protein, ur NEGATIVE NEGATIVE mg/dL   Nitrite NEGATIVE NEGATIVE   Leukocytes,Ua NEGATIVE NEGATIVE  Protein / creatinine ratio, urine     Status: None   Collection Time: 02/28/24  5:56 PM  Result Value Ref Range   Creatinine, Urine 43 mg/dL   Total Protein, Urine <6 mg/dL   Protein Creatinine Ratio        0.00 - 0.15 mg/mg[Cre]  CBC     Status: Abnormal   Collection Time: 02/28/24  6:08 PM  Result Value Ref Range   WBC 9.7 4.0 - 10.5 K/uL   RBC 3.36 (L) 3.87 - 5.11 MIL/uL   Hemoglobin 9.1 (L) 12.0 - 15.0 g/dL   HCT 72.0 (L) 63.9 - 53.9 %   MCV 83.0 80.0 - 100.0 fL   MCH 27.1 26.0 - 34.0 pg   MCHC 32.6 30.0 - 36.0 g/dL   RDW 85.1 88.4 - 84.4 %   Platelets 229 150 - 400 K/uL   nRBC 0.0 0.0 - 0.2 %  Comprehensive metabolic panel with GFR     Status: Abnormal   Collection Time: 02/28/24  6:08 PM  Result Value Ref Range   Sodium 135 135 - 145 mmol/L   Potassium 3.9 3.5 - 5.1 mmol/L   Chloride 104 98 - 111 mmol/L   CO2 21 (L) 22 - 32 mmol/L   Glucose, Bld 94  70 - 99 mg/dL   BUN 9 6 - 20 mg/dL   Creatinine, Ser 9.35 0.44 - 1.00 mg/dL   Calcium 8.6 (L) 8.9 - 10.3 mg/dL   Total Protein 5.5 (L) 6.5 - 8.1 g/dL   Albumin 2.4 (L) 3.5 - 5.0 g/dL   AST 13 (L) 15 - 41 U/L   ALT 9 0 - 44 U/L   Alkaline Phosphatase 112 38 - 126 U/L   Total Bilirubin 0.3 0.0 - 1.2 mg/dL   GFR, Estimated >39 >39 mL/min   Anion gap 10 5 - 15     P/C Ratio pending @ 1940, alb called and will process specimen in 15 mins. @2000  P/C Ratio remains pending. Lab called by RN.  Report given to and care assumed by Harlene Duncans, MSN, CNM @ [address], [place] 02/28/2024, 6:09 PM  Assessment and Plan   Reassessment (8:20 PM) -Results as above. -NST remains reactive.  -Provider to bedside to discuss.  -Patient endorses continued fetal movement.  -Patient expresses concern with elevated bp.  Provider offers to modify labetalol  dosing, but patient declines. States she will follow up with primary ob provider on Tuesday. -Precautions reviewed. -Instructed to take PM dose of labetalol  tonight. -No questions or concerns.  -Encouraged to call primary office or return to MAU if symptoms worsen or with the onset of new symptoms. -Discharged to home in stable condition.  Harlene LITTIE Duncans MSN, CNM Advanced Practice Provider, [facility] [employer]

## 2024-02-28 NOTE — MAU Note (Signed)
 MAU Triage Note  Angelica Spencer is a 33 y.o. at [redacted]w[redacted]d here in MAU reporting: DFM today, has only felt baby move a few times. Also reports her BP has been elevated this week. Reports it's been 140s/100s and 130s/90s. Has took both her morning and evening dose of labetalol . Endorses floaters in her vision, denies epigastric pain and HA. Denies VB and LOF.     Onset of complaint: this am Pain score: HA 1/10 Vitals:   02/28/24 1744  BP: (!) 135/95  Pulse: (!) 103  Resp: 18  Temp: 98.2 F (36.8 C)  SpO2: 100%     FHT: 138  Lab orders placed from triage: UA

## 2024-02-28 NOTE — MAU Provider Note (Incomplete Revision)
 History     CSN: [id number]  Arrival date and time: 02/28/24 1718   Event Date/Time   First Provider Initiated Contact with Patient 02/28/24 1809      Chief Complaint  Patient presents with   Decreased Fetal Movement   Visual Disturbances   Hypertension   HPI [name] is a 33 y.o. year old G39P2022 female at [redacted]w[redacted]d weeks gestation who presents to MAU reporting DFM today. She reports she has only felt a few times. She is currently feeling lots of FM. She states, She is making me out to be crazy. She reports her BP has increased this week; in the 140s/100s & 130s/90s. She has cHTN and takes Labetalol  200 mg TID; LD was this morning and this afternoon. She reports floaters in her vision upon registration to MAU. She reports a dull H/A; rated 1/10. She has not taken any medications for the H/A. She reports facial and hand swelling yesterday, but none today. She denies epigastric pain, VB or LOF. She receives [facility] with [facility]; next appt is 03/04/2024. Her spouse is present and contributing to the history taking.    OB History     Gravida  5   Para  2   Term  2   Preterm      AB  2   Living  2      SAB  2   IAB      Ectopic      Multiple  0   Live Births  2           Past Medical History:  Diagnosis Date   Anxiety    Depression    Endometriosis    GERD (gastroesophageal reflux disease)    History of hypothyroidism    per pt dx 2019 meds until 2020, no meds since , normalized   Hyperlipidemia    Hypertension    dr c. schumann/  dr marla. tobb   Migraines    NSVT (nonsustained ventricular tachycardia) [facility])    cardiologist--- dr [place]. schumann   OSA (obstructive sleep apnea)    followed by dr t. turner;   study done in epic 02-02-20245 moderate , (09-04-2022  per pt did get titrated for cpap but now waiting on medical supply company)   PCOS (polycystic ovarian syndrome)    Pre-diabetes    Wears contact lenses     Past Surgical History:   Procedure Laterality Date   BREAST REDUCTION SURGERY Bilateral 12/21/2021   Procedure: MAMMARY REDUCTION  (BREAST) POSSIBLE LIPOSUCTION;  Surgeon: Lowery Estefana RAMAN, DO;  Location: [facility];  Service: Plastics;  Laterality: Bilateral;   DILATION AND EVACUATION N/A 09/06/2022   Procedure: DILATATION AND EVACUATION (D&E) 2ND TRIMESTER;  Surgeon: Gorge Ade, MD;  Location: [facility];  Service: Gynecology;  Laterality: N/A;   OPERATIVE ULTRASOUND N/A 09/06/2022   Procedure: OPERATIVE ULTRASOUND;  Surgeon: Gorge Ade, MD;  Location: [facility];  Service: Gynecology;  Laterality: N/A;   ROBOTIC ASSISTED DIAGNOSTIC LAPAROSCOPY  09/27/2017   @ [facility] by dr a. simon;  EXCISION ENDOMETRIOSIS/ BILATERAL OVARIAN WEDGE RESECTION/  CHROMPERTUBATION/  HYSTEROSCOPIC UTERINE SEPTUM RESECTION W/ VERSAPOINT    Family History  Problem Relation Age of Onset   Cancer Mother        non-hodgkins lymphoma   Hyperlipidemia Father    Hypertension Father    Anxiety disorder Sister    Depression Sister    Bipolar disorder Sister  Cancer Maternal Grandmother        hodgkins lymphoma   COPD Paternal Grandmother    Diabetes Paternal Grandmother    Obesity Paternal Grandfather     Social History   Tobacco Use   Smoking status: Never   Smokeless tobacco: Never  Vaping Use   Vaping status: Never Used  Substance Use Topics   Alcohol use: Not Currently   Drug use: Never    Allergies:  Allergies  Allergen Reactions   Imitrex [Sumatriptan]     Pt reports heart palpitations     Medications Prior to Admission  Medication Sig Dispense Refill Last Dose/Taking   ferrous sulfate 325 (65 FE) MG EC tablet Take 325 mg by mouth 3 (three) times daily with meals.   Taking   labetalol  (NORMODYNE ) 100 MG tablet Take 2 tablets (200 mg total) by mouth 3 (three) times daily.   02/28/2024 at  2:00 PM   aspirin  EC 81 MG tablet Take 81 mg by mouth daily. Swallow  whole.      famotidine  (PEPCID ) 20 MG tablet Take 40 mg by mouth at bedtime.      Lactobacillus (AZO VAGINAL HEALTH PROBIOTIC PO) Take by mouth.      Prenatal Vit-Fe Fumarate-FA (PRENATAL MULTIVITAMIN) TABS tablet Take 1 tablet by mouth daily at 12 noon.      sertraline  (ZOLOFT ) 100 MG tablet Take 1 tablet by mouth daily. 30 tablet 0     Review of Systems  Constitutional: Negative.   HENT: Negative.    Eyes:  Positive for visual disturbance (seeing floaters since arriving to MAU).  Respiratory: Negative.    Cardiovascular: Negative.   Gastrointestinal: Negative.   Endocrine: Negative.   Genitourinary: Negative.        DFM  Musculoskeletal: Negative.   Skin: Negative.   Allergic/Immunologic: Negative.   Neurological:  Positive for headaches (dull).  Hematological: Negative.   Psychiatric/Behavioral: Negative.     Physical Exam   Patient Vitals for the past 24 hrs:  BP Temp Temp src Pulse Resp SpO2 Height Weight  02/28/24 1916 (!) 148/92 -- -- 99 -- 100 % -- --  02/28/24 1900 (!) 139/92 -- -- 97 -- 99 % -- --  02/28/24 1846 (!) 149/98 -- -- 94 -- -- -- --  02/28/24 1830 (!) 143/93 -- -- 94 -- 99 % -- --  02/28/24 1816 (!) 144/98 -- -- 88 -- -- -- --  02/28/24 1801 (!) 143/96 -- -- 93 -- 99 % -- --  02/28/24 1744 (!) 135/95 98.2 F (36.8 C) Oral (!) 103 18 100 % -- --  02/28/24 1737 -- -- -- -- -- -- 5' 3 (1.6 m) 103.6 kg    Physical Exam Vitals and nursing note reviewed.  Constitutional:      Appearance: Normal appearance. She is obese.  Cardiovascular:     Rate and Rhythm: Normal rate and regular rhythm.     Heart sounds: Normal heart sounds.  Pulmonary:     Effort: Pulmonary effort is normal.     Breath sounds: Normal breath sounds.  Abdominal:     General: Bowel sounds are normal.     Palpations: Abdomen is soft.  Musculoskeletal:        General: Normal range of motion.  Skin:    General: Skin is warm and dry.  Neurological:     Mental Status: She is  alert and oriented to person, place, and time.  Psychiatric:  Mood and Affect: Mood normal.        Behavior: Behavior normal.        Thought Content: Thought content normal.        Judgment: Judgment normal.    REACTIVE NST - FHR: 135 bpm / moderate variability / accels present / decels absent / TOCO: 1 UC with UI noted  MAU Course  Procedures  MDM CCUA CBC CMP P/C Ratio Serial BP's   Results for orders placed or performed during the hospital encounter of 02/28/24 (from the past 24 hours)  Urinalysis, Routine w reflex microscopic -Urine, Clean Catch     Status: Abnormal   Collection Time: 02/28/24  5:49 PM  Result Value Ref Range   Color, Urine STRAW (A) YELLOW   APPearance HAZY (A) CLEAR   Specific Gravity, Urine 1.006 1.005 - 1.030   pH 7.0 5.0 - 8.0   Glucose, UA NEGATIVE NEGATIVE mg/dL   Hgb urine dipstick NEGATIVE NEGATIVE   Bilirubin Urine NEGATIVE NEGATIVE   Ketones, ur NEGATIVE NEGATIVE mg/dL   Protein, ur NEGATIVE NEGATIVE mg/dL   Nitrite NEGATIVE NEGATIVE   Leukocytes,Ua NEGATIVE NEGATIVE  CBC     Status: Abnormal   Collection Time: 02/28/24  6:08 PM  Result Value Ref Range   WBC 9.7 4.0 - 10.5 K/uL   RBC 3.36 (L) 3.87 - 5.11 MIL/uL   Hemoglobin 9.1 (L) 12.0 - 15.0 g/dL   HCT 72.0 (L) 63.9 - 53.9 %   MCV 83.0 80.0 - 100.0 fL   MCH 27.1 26.0 - 34.0 pg   MCHC 32.6 30.0 - 36.0 g/dL   RDW 85.1 88.4 - 84.4 %   Platelets 229 150 - 400 K/uL   nRBC 0.0 0.0 - 0.2 %  Comprehensive metabolic panel with GFR     Status: Abnormal   Collection Time: 02/28/24  6:08 PM  Result Value Ref Range   Sodium 135 135 - 145 mmol/L   Potassium 3.9 3.5 - 5.1 mmol/L   Chloride 104 98 - 111 mmol/L   CO2 21 (L) 22 - 32 mmol/L   Glucose, Bld 94 70 - 99 mg/dL   BUN 9 6 - 20 mg/dL   Creatinine, Ser 9.35 0.44 - 1.00 mg/dL   Calcium 8.6 (L) 8.9 - 10.3 mg/dL   Total Protein 5.5 (L) 6.5 - 8.1 g/dL   Albumin 2.4 (L) 3.5 - 5.0 g/dL   AST 13 (L) 15 - 41 U/L   ALT 9 0 - 44 U/L    Alkaline Phosphatase 112 38 - 126 U/L   Total Bilirubin 0.3 0.0 - 1.2 mg/dL   GFR, Estimated >39 >39 mL/min   Anion gap 10 5 - 15     P/C Ratio pending @ 1940, alb called and will process specimen in 15 mins. @2000  P/C Ratio remains pending. Lab called by RN.  Report given to and care assumed by Harlene Duncans, MSN, CNM @ [address], [place] 02/28/2024, 6:09 PM  Assessment and Plan   Reassessment (8:20 PM) -Results as above. -Provider to bedside to discuss.

## 2024-02-29 NOTE — Telephone Encounter (Signed)
 Patient called to discuss scheduling iron infusions for which her  ordering provider sent a referral to [facility]. I attempted to call the outside ordering provider, [facility], to fax an order sheet for the iron infusion but the office was closed. Will try again when the office reopens. Patient verbalized understanding.

## 2024-03-03 ENCOUNTER — Inpatient Hospital Stay (HOSPITAL_COMMUNITY)
Admission: AD | Admit: 2024-03-03 | Discharge: 2024-03-03 | Disposition: A | Discharge status: Home / Self Care | Payer: PRIVATE HEALTH INSURANCE | Attending: Obstetrics & Gynecology | Admitting: Obstetrics & Gynecology

## 2024-03-03 ENCOUNTER — Encounter (HOSPITAL_COMMUNITY): Payer: Self-pay | Admitting: Obstetrics & Gynecology

## 2024-03-03 DIAGNOSIS — O26893 Other specified pregnancy related conditions, third trimester: Secondary | ICD-10-CM | POA: Insufficient documentation

## 2024-03-03 DIAGNOSIS — O10013 Pre-existing essential hypertension complicating pregnancy, third trimester: Secondary | ICD-10-CM | POA: Insufficient documentation

## 2024-03-03 DIAGNOSIS — Z3A35 35 weeks gestation of pregnancy: Secondary | ICD-10-CM | POA: Insufficient documentation

## 2024-03-03 DIAGNOSIS — R519 Headache, unspecified: Secondary | ICD-10-CM | POA: Insufficient documentation

## 2024-03-03 DIAGNOSIS — O10913 Unspecified pre-existing hypertension complicating pregnancy, third trimester: Secondary | ICD-10-CM

## 2024-03-03 LAB — CBC
HCT: 31.8 % — ABNORMAL LOW (ref 36.0–46.0)
Hemoglobin: 10.2 g/dL — ABNORMAL LOW (ref 12.0–15.0)
MCH: 26.6 pg (ref 26.0–34.0)
MCHC: 32.1 g/dL (ref 30.0–36.0)
MCV: 83 fL (ref 80.0–100.0)
Platelets: 252 K/uL (ref 150–400)
RBC: 3.83 MIL/uL — ABNORMAL LOW (ref 3.87–5.11)
RDW: 14.8 % (ref 11.5–15.5)
WBC: 10.7 K/uL — ABNORMAL HIGH (ref 4.0–10.5)
nRBC: 0 % (ref 0.0–0.2)

## 2024-03-03 LAB — COMPREHENSIVE METABOLIC PANEL WITH GFR
ALT: 11 U/L (ref 0–44)
AST: 12 U/L — ABNORMAL LOW (ref 15–41)
Albumin: 2.6 g/dL — ABNORMAL LOW (ref 3.5–5.0)
Alkaline Phosphatase: 126 U/L (ref 38–126)
Anion gap: 10 (ref 5–15)
BUN: 8 mg/dL (ref 6–20)
CO2: 22 mmol/L (ref 22–32)
Calcium: 9.1 mg/dL (ref 8.9–10.3)
Chloride: 104 mmol/L (ref 98–111)
Creatinine, Ser: 0.55 mg/dL (ref 0.44–1.00)
GFR, Estimated: >60 mL/min (ref >60)
Glucose, Bld: 91 mg/dL (ref 70–99)
Potassium: 4 mmol/L (ref 3.5–5.1)
Sodium: 136 mmol/L (ref 135–145)
Total Bilirubin: 0.3 mg/dL (ref 0.0–1.2)
Total Protein: 6.2 g/dL — ABNORMAL LOW (ref 6.5–8.1)

## 2024-03-03 LAB — PROTEIN / CREATININE RATIO, URINE
Creatinine, Urine: 60 mg/dL
Total Protein, Urine: <6 mg/dL

## 2024-03-03 MED ORDER — LABETALOL HCL 100 MG PO TABS
100.0000 mg | ORAL_TABLET | Freq: Once | ORAL | Status: AC
  Administered 2024-03-03: 100 mg via ORAL
  Filled 2024-03-03: qty 1

## 2024-03-03 MED ORDER — LABETALOL HCL 300 MG PO TABS: 300.0000 mg | ORAL_TABLET | Freq: Three times a day (TID) | ORAL | 0 refills | Status: DC

## 2024-03-03 NOTE — MAU Provider Note (Signed)
 History     CSN: [id number]  Arrival date and time: 03/03/24 0730   Event Date/Time   First Provider Initiated Contact with Patient 03/03/24 [phone]      Chief Complaint  Patient presents with   Hypertension   HPI [name] is a 33 y.o. year old G25P2022 female at [redacted]w[redacted]d weeks gestation who presents to MAU reporting increased BP readings this morning before and after Labetalol . She reports the readings as 158/100 and 165/104. She took her BP before taking Labetalol  200 mg at ~0640 this morning. She immediately took BP again after taking the Labetalol . She expresses concern after seeing the BP >160 for the systolic reading. She reports a H/A and swelling of her hands; H/A rated 2/10. She reports good FM. She receives [facility] with [facility]; next appt is 03/04/2024. Her spouse is present and contributing to the history taking.    OB History     Gravida  5   Para  2   Term  2   Preterm      AB  2   Living  2      SAB  2   IAB      Ectopic      Multiple  0   Live Births  2           Past Medical History:  Diagnosis Date   Anxiety    Depression    Endometriosis    GERD (gastroesophageal reflux disease)    History of hypothyroidism    per pt dx 2019 meds until 2020, no meds since , normalized   Hyperlipidemia    Hypertension    dr c. schumann/  dr marla. tobb   Migraines    NSVT (nonsustained ventricular tachycardia) [facility])    cardiologist--- dr [place]. schumann   OSA (obstructive sleep apnea)    followed by dr t. turner;   study done in epic 02-02-20245 moderate , (09-04-2022  per pt did get titrated for cpap but now waiting on medical supply company)   PCOS (polycystic ovarian syndrome)    Pre-diabetes    Wears contact lenses     Past Surgical History:  Procedure Laterality Date   BREAST REDUCTION SURGERY Bilateral 12/21/2021   Procedure: MAMMARY REDUCTION  (BREAST) POSSIBLE LIPOSUCTION;  Surgeon: Lowery Estefana RAMAN, DO;  Location: [facility];  Service: Plastics;  Laterality: Bilateral;   DILATION AND EVACUATION N/A 09/06/2022   Procedure: DILATATION AND EVACUATION (D&E) 2ND TRIMESTER;  Surgeon: Gorge Ade, MD;  Location: [facility];  Service: Gynecology;  Laterality: N/A;   OPERATIVE ULTRASOUND N/A 09/06/2022   Procedure: OPERATIVE ULTRASOUND;  Surgeon: Gorge Ade, MD;  Location: [facility];  Service: Gynecology;  Laterality: N/A;   ROBOTIC ASSISTED DIAGNOSTIC LAPAROSCOPY  09/27/2017   @ [facility] by dr a. simon;  EXCISION ENDOMETRIOSIS/ BILATERAL OVARIAN WEDGE RESECTION/  CHROMPERTUBATION/  HYSTEROSCOPIC UTERINE SEPTUM RESECTION W/ VERSAPOINT    Family History  Problem Relation Age of Onset   Cancer Mother        non-hodgkins lymphoma   Hyperlipidemia Father    Hypertension Father    Anxiety disorder Sister    Depression Sister    Bipolar disorder Sister    Cancer Maternal Grandmother        hodgkins lymphoma   COPD Paternal Grandmother    Diabetes Paternal Grandmother    Obesity Paternal Grandfather     Social History   Tobacco Use   Smoking  status: Never   Smokeless tobacco: Never  Vaping Use   Vaping status: Never Used  Substance Use Topics   Alcohol use: Not Currently   Drug use: Never    Allergies:  Allergies  Allergen Reactions   Imitrex [Sumatriptan]     Pt reports heart palpitations     No medications prior to admission.    Review of Systems  Constitutional: Negative.   HENT: Negative.    Eyes: Negative.   Respiratory: Negative.    Cardiovascular: Negative.   Gastrointestinal: Negative.   Endocrine: Negative.   Genitourinary: Negative.        (+) FM  Musculoskeletal:        Swelling in both hands  Skin: Negative.   Allergic/Immunologic: Negative.   Neurological:  Positive for headaches.  Hematological: Negative.   Psychiatric/Behavioral: Negative.     Physical Exam   Patient Vitals for the past 24 hrs:  BP Temp Pulse Resp SpO2  Height Weight  03/03/24 1020 (!) 143/98 -- 85 -- -- -- --  03/03/24 1001 138/81 -- 83 -- 100 % -- --  03/03/24 0946 (!) 140/87 -- 89 -- 99 % -- --  03/03/24 0930 (!) 142/99 -- 97 -- 100 % -- --  03/03/24 0916 (!) 144/91 -- 90 -- 99 % -- --  03/03/24 0903 135/87 -- 87 -- 99 % -- --  03/03/24 0846 (!) 140/93 -- 93 -- 99 % -- --  03/03/24 0830 (!) 141/95 -- 90 -- 99 % -- --  03/03/24 0816 (!) 139/100 -- 95 -- 99 % -- --  03/03/24 0758 (!) 145/94 -- 93 -- 98 % -- --  03/03/24 0748 (!) 154/98 98 F (36.7 C) (!) 105 18 -- 5' 3 (1.6 m) 102.5 kg    Physical Exam Vitals and nursing note reviewed.  Constitutional:      Appearance: Normal appearance. She is obese.  Cardiovascular:     Rate and Rhythm: Normal rate and regular rhythm.     Heart sounds: Normal heart sounds.  Pulmonary:     Effort: Pulmonary effort is normal.     Breath sounds: Normal breath sounds.  Abdominal:     General: Bowel sounds are normal.     Palpations: Abdomen is soft.     Comments: gravid  Genitourinary:    Comments: deferred Musculoskeletal:        General: Swelling (both hands) present.  Skin:    General: Skin is warm and dry.  Neurological:     Mental Status: She is alert and oriented to person, place, and time.  Psychiatric:        Mood and Affect: Mood normal.        Behavior: Behavior normal.        Thought Content: Thought content normal.        Judgment: Judgment normal.    REACTIVE NST - FHR: 130 bpm / moderate variability / accels present / decels absent / TOCO: irregular UC's with UI noted  MAU Course  Procedures  MDM CCUA CBC CMP P/C Ratio Serial BP's  Labetalol  100 mg po to supplement the 200 mg dose she took at 0640 this AM  TC to main lab to verify result of P/C ratio: Lab technician states the comment means that the result was too low to calculate a ratio 0938: Dr. Barbette on unit to review patient's lab results. Will tell patient she will need to be increased to Labetalol  300 mg  TID from today until  IOL.  Results for orders placed or performed during the hospital encounter of 03/03/24 (from the past 24 hours)  Protein / creatinine ratio, urine     Status: None   Collection Time: 03/03/24  7:36 AM  Result Value Ref Range   Creatinine, Urine 60 mg/dL   Total Protein, Urine <6 mg/dL   Protein Creatinine Ratio RESULT OUTSIDE REPORTABLE RANGE,  UNABLE TO CALCULATE.        0.00 - 0.15 mg/mg[Cre]  CBC     Status: Abnormal   Collection Time: 03/03/24  7:46 AM  Result Value Ref Range   WBC 10.7 (H) 4.0 - 10.5 K/uL   RBC 3.83 (L) 3.87 - 5.11 MIL/uL   Hemoglobin 10.2 (L) 12.0 - 15.0 g/dL   HCT 68.1 (L) 63.9 - 53.9 %   MCV 83.0 80.0 - 100.0 fL   MCH 26.6 26.0 - 34.0 pg   MCHC 32.1 30.0 - 36.0 g/dL   RDW 85.1 88.4 - 84.4 %   Platelets 252 150 - 400 K/uL   nRBC 0.0 0.0 - 0.2 %  Comprehensive metabolic panel with GFR     Status: Abnormal   Collection Time: 03/03/24  7:46 AM  Result Value Ref Range   Sodium 136 135 - 145 mmol/L   Potassium 4.0 3.5 - 5.1 mmol/L   Chloride 104 98 - 111 mmol/L   CO2 22 22 - 32 mmol/L   Glucose, Bld 91 70 - 99 mg/dL   BUN 8 6 - 20 mg/dL   Creatinine, Ser 9.44 0.44 - 1.00 mg/dL   Calcium 9.1 8.9 - 89.6 mg/dL   Total Protein 6.2 (L) 6.5 - 8.1 g/dL   Albumin 2.6 (L) 3.5 - 5.0 g/dL   AST 12 (L) 15 - 41 U/L   ALT 11 0 - 44 U/L   Alkaline Phosphatase 126 38 - 126 U/L   Total Bilirubin 0.3 0.0 - 1.2 mg/dL   GFR, Estimated >39 >39 mL/min   Anion gap 10 5 - 15      Assessment and Plan  1. Chronic hypertension with exacerbation during pregnancy in third trimester (Primary) - Prescription for: Labetalol  300 mg TID po  - Information provided on HTN in pregnancy and form to record BP readings   2. Pregnancy headache in third trimester - May take Tylenol  1000 mg po every 8 hours prn  3. [redacted] weeks gestation of pregnancy   - Discharge home - Keep scheduled appt with [facility] tomorrow morning - Patient verbalized an understanding of the plan of  care and agrees.   Ala Cart, CNM 03/03/2024, 8:17 AM

## 2024-03-03 NOTE — MAU Note (Signed)
 Angelica Spencer is a 33 y.o. at [redacted]w[redacted]d here in MAU reporting: increased b/p has chronic HTN and b/p readings were 158/100 and 165/104 at home .reports  headache 2/10 increased swelling in her hands.  Good fetal movement reported.   LMP:  Onset of complaint: ongoing Pain score: 2  Vitals:   03/03/24 0748  BP: (!) 154/98  Pulse: (!) 105  Resp: 18  Temp: 98 F (36.7 C)     FHT: 165  Lab orders placed from triage: pih

## 2024-03-04 ENCOUNTER — Telehealth (HOSPITAL_COMMUNITY): Payer: Self-pay | Admitting: *Deleted

## 2024-03-04 NOTE — Telephone Encounter (Signed)
 Preadmission screen

## 2024-03-06 ENCOUNTER — Encounter (HOSPITAL_COMMUNITY): Payer: Self-pay | Admitting: Obstetrics & Gynecology

## 2024-03-06 ENCOUNTER — Inpatient Hospital Stay (HOSPITAL_COMMUNITY)
Admission: AD | Admit: 2024-03-06 | Discharge: 2024-03-09 | DRG: 807 | Disposition: A | Payer: PRIVATE HEALTH INSURANCE | Attending: Obstetrics & Gynecology | Admitting: Obstetrics & Gynecology

## 2024-03-06 ENCOUNTER — Other Ambulatory Visit: Payer: Self-pay

## 2024-03-06 DIAGNOSIS — R7303 Prediabetes: Secondary | ICD-10-CM | POA: Diagnosis present

## 2024-03-06 DIAGNOSIS — O99013 Anemia complicating pregnancy, third trimester: Secondary | ICD-10-CM

## 2024-03-06 DIAGNOSIS — Z79899 Other long term (current) drug therapy: Secondary | ICD-10-CM

## 2024-03-06 DIAGNOSIS — Z833 Family history of diabetes mellitus: Secondary | ICD-10-CM

## 2024-03-06 DIAGNOSIS — O99344 Other mental disorders complicating childbirth: Secondary | ICD-10-CM | POA: Diagnosis present

## 2024-03-06 DIAGNOSIS — Z8249 Family history of ischemic heart disease and other diseases of the circulatory system: Secondary | ICD-10-CM

## 2024-03-06 DIAGNOSIS — F32A Depression, unspecified: Secondary | ICD-10-CM | POA: Diagnosis present

## 2024-03-06 DIAGNOSIS — Z3A36 36 weeks gestation of pregnancy: Secondary | ICD-10-CM

## 2024-03-06 DIAGNOSIS — O99214 Obesity complicating childbirth: Secondary | ICD-10-CM | POA: Diagnosis present

## 2024-03-06 DIAGNOSIS — O9902 Anemia complicating childbirth: Secondary | ICD-10-CM | POA: Diagnosis present

## 2024-03-06 DIAGNOSIS — Z6791 Unspecified blood type, Rh negative: Secondary | ICD-10-CM | POA: Diagnosis present

## 2024-03-06 DIAGNOSIS — O119 Pre-existing hypertension with pre-eclampsia, unspecified trimester: Principal | ICD-10-CM | POA: Diagnosis present

## 2024-03-06 DIAGNOSIS — O26893 Other specified pregnancy related conditions, third trimester: Secondary | ICD-10-CM | POA: Diagnosis present

## 2024-03-06 DIAGNOSIS — D509 Iron deficiency anemia, unspecified: Secondary | ICD-10-CM | POA: Diagnosis present

## 2024-03-06 DIAGNOSIS — O1002 Pre-existing essential hypertension complicating childbirth: Secondary | ICD-10-CM | POA: Diagnosis present

## 2024-03-06 DIAGNOSIS — O99019 Anemia complicating pregnancy, unspecified trimester: Secondary | ICD-10-CM | POA: Diagnosis present

## 2024-03-06 DIAGNOSIS — O114 Pre-existing hypertension with pre-eclampsia, complicating childbirth: Principal | ICD-10-CM | POA: Diagnosis present

## 2024-03-06 DIAGNOSIS — E039 Hypothyroidism, unspecified: Secondary | ICD-10-CM | POA: Diagnosis present

## 2024-03-06 DIAGNOSIS — F419 Anxiety disorder, unspecified: Secondary | ICD-10-CM | POA: Diagnosis present

## 2024-03-06 DIAGNOSIS — Z7982 Long term (current) use of aspirin: Secondary | ICD-10-CM

## 2024-03-06 LAB — URINALYSIS, ROUTINE W REFLEX MICROSCOPIC
Bilirubin Urine: NEGATIVE
Glucose, UA: NEGATIVE mg/dL
Hgb urine dipstick: NEGATIVE
Ketones, ur: NEGATIVE mg/dL
Leukocytes,Ua: NEGATIVE
Nitrite: NEGATIVE
Protein, ur: NEGATIVE mg/dL
Specific Gravity, Urine: 1.013 (ref 1.005–1.030)
pH: 6 (ref 5.0–8.0)

## 2024-03-06 LAB — TYPE AND SCREEN
ABO/RH(D): O NEG
Antibody Screen: NEGATIVE

## 2024-03-06 LAB — COMPREHENSIVE METABOLIC PANEL WITH GFR
ALT: 10 U/L (ref 0–44)
AST: 14 U/L — ABNORMAL LOW (ref 15–41)
Albumin: 2.5 g/dL — ABNORMAL LOW (ref 3.5–5.0)
Alkaline Phosphatase: 124 U/L (ref 38–126)
Anion gap: 8 (ref 5–15)
BUN: 9 mg/dL (ref 6–20)
CO2: 21 mmol/L — ABNORMAL LOW (ref 22–32)
Calcium: 8.8 mg/dL — ABNORMAL LOW (ref 8.9–10.3)
Chloride: 105 mmol/L (ref 98–111)
Creatinine, Ser: 0.69 mg/dL (ref 0.44–1.00)
GFR, Estimated: >60 mL/min (ref >60)
Glucose, Bld: 92 mg/dL (ref 70–99)
Potassium: 4 mmol/L (ref 3.5–5.1)
Sodium: 134 mmol/L — ABNORMAL LOW (ref 135–145)
Total Bilirubin: 0.6 mg/dL (ref 0.0–1.2)
Total Protein: 5.9 g/dL — ABNORMAL LOW (ref 6.5–8.1)

## 2024-03-06 LAB — CBC
HCT: 30 % — ABNORMAL LOW (ref 36.0–46.0)
Hemoglobin: 9.6 g/dL — ABNORMAL LOW (ref 12.0–15.0)
MCH: 26.2 pg (ref 26.0–34.0)
MCHC: 32 g/dL (ref 30.0–36.0)
MCV: 81.7 fL (ref 80.0–100.0)
Platelets: 235 K/uL (ref 150–400)
RBC: 3.67 MIL/uL — ABNORMAL LOW (ref 3.87–5.11)
RDW: 14.9 % (ref 11.5–15.5)
WBC: 13.1 K/uL — ABNORMAL HIGH (ref 4.0–10.5)
nRBC: 0 % (ref 0.0–0.2)

## 2024-03-06 LAB — PROTEIN / CREATININE RATIO, URINE
Creatinine, Urine: 96 mg/dL
Protein Creatinine Ratio: 0.07 mg/mg{creat} (ref 0.00–0.15)
Total Protein, Urine: 7 mg/dL

## 2024-03-06 MED ORDER — IBUPROFEN 600 MG PO TABS
600.0000 mg | ORAL_TABLET | Freq: Four times a day (QID) | ORAL | Status: DC
  Administered 2024-03-06 – 2024-03-09 (×11): 600 mg via ORAL
  Filled 2024-03-06 (×11): qty 1

## 2024-03-06 MED ORDER — OXYTOCIN BOLUS FROM INFUSION
333.0000 mL | Freq: Once | INTRAVENOUS | Status: AC
  Administered 2024-03-06: 333 mL via INTRAVENOUS

## 2024-03-06 MED ORDER — ACETAMINOPHEN 500 MG PO TABS
1000.0000 mg | ORAL_TABLET | Freq: Once | ORAL | Status: AC
  Administered 2024-03-06: 1000 mg via ORAL
  Filled 2024-03-06: qty 2

## 2024-03-06 MED ORDER — PRENATAL MULTIVITAMIN CH
1.0000 | ORAL_TABLET | Freq: Every day | ORAL | Status: DC
  Administered 2024-03-07 – 2024-03-09 (×3): 1 via ORAL
  Filled 2024-03-06 (×3): qty 1

## 2024-03-06 MED ORDER — LABETALOL HCL 5 MG/ML IV SOLN: 80.0000 mg | INTRAVENOUS | Status: DC | PRN

## 2024-03-06 MED ORDER — ONDANSETRON HCL 4 MG/2ML IJ SOLN: 4.0000 mg | INTRAMUSCULAR | Status: DC | PRN

## 2024-03-06 MED ORDER — SOD CITRATE-CITRIC ACID 500-334 MG/5ML PO SOLN: 30.0000 mL | ORAL | Status: DC | PRN

## 2024-03-06 MED ORDER — OXYCODONE-ACETAMINOPHEN 5-325 MG PO TABS: 1.0000 | ORAL_TABLET | ORAL | Status: DC | PRN

## 2024-03-06 MED ORDER — MISOPROSTOL 25 MCG QUARTER TABLET: 25.0000 ug | ORAL_TABLET | Freq: Once | ORAL | Status: DC

## 2024-03-06 MED ORDER — SODIUM CHLORIDE 0.9 % IV SOLN
500.0000 mg | Freq: Once | INTRAVENOUS | Status: AC
  Administered 2024-03-07: 500 mg via INTRAVENOUS
  Filled 2024-03-06: qty 25

## 2024-03-06 MED ORDER — FENTANYL-BUPIVACAINE-NACL 0.5-0.125-0.9 MG/250ML-% EP SOLN: 12.0000 mL/h | EPIDURAL | Status: DC | PRN

## 2024-03-06 MED ORDER — ACETAMINOPHEN 325 MG PO TABS: 650.0000 mg | ORAL_TABLET | ORAL | Status: DC | PRN

## 2024-03-06 MED ORDER — BENZOCAINE-MENTHOL 20-0.5 % EX AERO: 1.0000 | INHALATION_SPRAY | CUTANEOUS | Status: DC | PRN

## 2024-03-06 MED ORDER — EPHEDRINE 5 MG/ML INJ: 10.0000 mg | INTRAVENOUS | Status: DC | PRN

## 2024-03-06 MED ORDER — SODIUM CHLORIDE 0.9 % IV SOLN
500.0000 mg | INTRAVENOUS | Status: DC
  Filled 2024-03-06 (×2): qty 25

## 2024-03-06 MED ORDER — LABETALOL HCL 5 MG/ML IV SOLN: 20.0000 mg | INTRAVENOUS | Status: DC | PRN

## 2024-03-06 MED ORDER — COCONUT OIL OIL: 1.0000 | TOPICAL_OIL | Status: DC | PRN

## 2024-03-06 MED ORDER — TETANUS-DIPHTH-ACELL PERTUSSIS 5-2-15.5 LF-MCG/0.5 IM SUSP: 0.5000 mL | Freq: Once | INTRAMUSCULAR | Status: DC

## 2024-03-06 MED ORDER — HYDRALAZINE HCL 20 MG/ML IJ SOLN: 10.0000 mg | INTRAMUSCULAR | Status: DC | PRN

## 2024-03-06 MED ORDER — ONDANSETRON HCL 4 MG PO TABS: 4.0000 mg | ORAL_TABLET | ORAL | Status: DC | PRN

## 2024-03-06 MED ORDER — FENTANYL CITRATE (PF) 100 MCG/2ML IJ SOLN
100.0000 ug | INTRAMUSCULAR | Status: DC | PRN
  Administered 2024-03-06: 100 ug via INTRAVENOUS
  Filled 2024-03-06: qty 2

## 2024-03-06 MED ORDER — LACTATED RINGERS IV SOLN
500.0000 mL | INTRAVENOUS | Status: AC | PRN
  Administered 2024-03-06: 500 mL via INTRAVENOUS

## 2024-03-06 MED ORDER — OXYTOCIN-SODIUM CHLORIDE 30-0.9 UT/500ML-% IV SOLN
2.5000 [IU]/h | INTRAVENOUS | Status: DC
  Administered 2024-03-06: 2.5 [IU]/h via INTRAVENOUS
  Filled 2024-03-06: qty 500

## 2024-03-06 MED ORDER — LABETALOL HCL 200 MG PO TABS
300.0000 mg | ORAL_TABLET | Freq: Three times a day (TID) | ORAL | Status: DC
  Administered 2024-03-06 (×2): 300 mg via ORAL
  Filled 2024-03-06: qty 3
  Filled 2024-03-06: qty 1

## 2024-03-06 MED ORDER — TERBUTALINE SULFATE 1 MG/ML IJ SOLN: 0.2500 mg | Freq: Once | INTRAMUSCULAR | Status: DC | PRN

## 2024-03-06 MED ORDER — SODIUM CHLORIDE 0.9 % IV SOLN: 5.0000 10*6.[IU] | Freq: Once | INTRAVENOUS | Status: DC

## 2024-03-06 MED ORDER — PHENYLEPHRINE 80 MCG/ML (10ML) SYRINGE FOR IV PUSH (FOR BLOOD PRESSURE SUPPORT): 80.0000 ug | PREFILLED_SYRINGE | INTRAVENOUS | Status: DC | PRN

## 2024-03-06 MED ORDER — ACETAMINOPHEN 325 MG PO TABS
650.0000 mg | ORAL_TABLET | ORAL | Status: DC | PRN
  Administered 2024-03-06: 650 mg via ORAL
  Filled 2024-03-06: qty 2

## 2024-03-06 MED ORDER — ONDANSETRON HCL 4 MG/2ML IJ SOLN: 4.0000 mg | Freq: Four times a day (QID) | INTRAMUSCULAR | Status: DC | PRN

## 2024-03-06 MED ORDER — LACTATED RINGERS IV SOLN: 500.0000 mL | Freq: Once | INTRAVENOUS | Status: DC

## 2024-03-06 MED ORDER — DIPHENHYDRAMINE HCL 50 MG/ML IJ SOLN: 12.5000 mg | INTRAMUSCULAR | Status: DC | PRN

## 2024-03-06 MED ORDER — LABETALOL HCL 5 MG/ML IV SOLN: 40.0000 mg | INTRAVENOUS | Status: DC | PRN

## 2024-03-06 MED ORDER — OXYCODONE-ACETAMINOPHEN 5-325 MG PO TABS: 2.0000 | ORAL_TABLET | ORAL | Status: DC | PRN

## 2024-03-06 MED ORDER — PENICILLIN G POTASSIUM 5000000 UNITS IJ SOLR
2.5000 10*6.[IU] | INTRAVENOUS | Status: DC
  Filled 2024-03-06 (×3): qty 2.5

## 2024-03-06 MED ORDER — DIBUCAINE (PERIANAL) 1 % EX OINT: 1.0000 | TOPICAL_OINTMENT | CUTANEOUS | Status: DC | PRN

## 2024-03-06 MED ORDER — LACTATED RINGERS IV SOLN: INTRAVENOUS | Status: AC

## 2024-03-06 MED ORDER — DIPHENHYDRAMINE HCL 25 MG PO CAPS: 25.0000 mg | ORAL_CAPSULE | Freq: Four times a day (QID) | ORAL | Status: DC | PRN

## 2024-03-06 MED ORDER — OXYTOCIN-SODIUM CHLORIDE 30-0.9 UT/500ML-% IV SOLN
1.0000 m[IU]/min | INTRAVENOUS | Status: DC
  Administered 2024-03-06: 2 m[IU]/min via INTRAVENOUS

## 2024-03-06 MED ORDER — PENICILLIN G POT IN DEXTROSE 60000 UNIT/ML IV SOLN
3.0000 10*6.[IU] | INTRAVENOUS | Status: DC
  Administered 2024-03-06 (×2): 3 10*6.[IU] via INTRAVENOUS
  Filled 2024-03-06 (×6): qty 50

## 2024-03-06 MED ORDER — SENNOSIDES-DOCUSATE SODIUM 8.6-50 MG PO TABS
2.0000 | ORAL_TABLET | Freq: Every day | ORAL | Status: DC
  Administered 2024-03-07 – 2024-03-09 (×3): 2 via ORAL
  Filled 2024-03-06 (×3): qty 2

## 2024-03-06 MED ORDER — ZOLPIDEM TARTRATE 5 MG PO TABS: 5.0000 mg | ORAL_TABLET | Freq: Every evening | ORAL | Status: DC | PRN

## 2024-03-06 MED ORDER — ACETAMINOPHEN 500 MG PO TABS: 500.0000 mg | ORAL_TABLET | ORAL | Status: DC

## 2024-03-06 MED ORDER — DIPHENHYDRAMINE HCL 25 MG PO CAPS: 25.0000 mg | ORAL_CAPSULE | ORAL | Status: DC

## 2024-03-06 MED ORDER — SODIUM CHLORIDE 0.9 % IV SOLN
5.0000 10*6.[IU] | Freq: Once | INTRAVENOUS | Status: AC
  Administered 2024-03-06: 5 10*6.[IU] via INTRAVENOUS
  Filled 2024-03-06: qty 5

## 2024-03-06 MED ORDER — LIDOCAINE HCL (PF) 1 % IJ SOLN: 30.0000 mL | INTRAMUSCULAR | Status: DC | PRN

## 2024-03-06 MED ORDER — SIMETHICONE 80 MG PO CHEW: 80.0000 mg | CHEWABLE_TABLET | ORAL | Status: DC | PRN

## 2024-03-06 MED ORDER — WITCH HAZEL-GLYCERIN EX PADS: 1.0000 | MEDICATED_PAD | CUTANEOUS | Status: DC | PRN

## 2024-03-06 MED ORDER — LABETALOL HCL 200 MG PO TABS
300.0000 mg | ORAL_TABLET | Freq: Three times a day (TID) | ORAL | Status: DC
  Administered 2024-03-06 – 2024-03-07 (×2): 300 mg via ORAL
  Filled 2024-03-06 (×2): qty 1

## 2024-03-06 NOTE — Progress Notes (Signed)
 Received call from MAU provider  Pt with Chronic HTN now superimposed PEC, 36.1 wks, recommending IOL Porceed with L&D admission.. I have reviewed chart, vitals, labs and FHT, agree with plan Orders placed.  IOL with Cytotec  25 mcg PO and 25 mcg vag  PEC focused HTN protocol  CEFM Light labor diet until active labor  CNM Chasteen is 1st on call and L&D RN needs to call her when patient is transferred up to L&D.   --Dr Shikita Vaillancourt

## 2024-03-06 NOTE — Progress Notes (Signed)
 To bedside after period of ambulation around unit with pt and spouse, rpts ctx increasing with movement, pain 7/10 with ctx, coping well with rest in-between.   - VSS,     03/06/2024    2:00 PM 03/06/2024   12:27 PM 03/06/2024   11:15 AM  Vitals with BMI  Systolic 126 125 868  Diastolic 83 72 84  Pulse 91 90 81    - FHR Cat 1 tracing at this time, period of movement off the monitor with ambulation, overall baseline 140s, accels present.   - GBS unknown, PCN x2 given.   - Membranes ruptured, clear fluid.  - Continue IOL for PEC at this time, pitocin  infusion, cts pattern regular q 3-4 minutes, resting tone soft. Plan to reassess for cervical change in 2-3 hrs or as indicated.

## 2024-03-06 NOTE — H&P (Addendum)
 OB ADMISSION/ HISTORY & PHYSICAL:  Admission Date: 03/06/2024 12:00 AM  Admit Diagnosis: Chronic hypertension with superimposed preeclampsia [O11.9]    Angelica Spencer is a 33 y.o. female [phone] at [redacted]w[redacted]d presenting for elevated BP at home despite medication management, known gHTN now with superimposed PEC. Has been seen frequently in MAU for creeping pressures, has been taking labetalol  300mg  TID. Pt denies VB, LOF at this time. Minimal cramping felt. Supported by husband Worth they are both eagerly anticipating baby girl [name].  Prenatal History: [id number]   EDC: 04/02/2024, by Last Menstrual Period  Prenatal care at [facility] since 1st Trimester.   Primary:   Prenatal course complicated by: Anemia, essential hypertension, prediabetes during pregnancy, hypothyroid and RhD negative.   Prenatal Labs: ABO, Rh:   O, NEGATIVE Antibody: NEG (12/04 0545) Rubella: Equivocal (06/09 0000)  RPR: Nonreactive (06/09 0000)  HBsAg: Negative (06/09 0000)  HIV: Non-reactive (06/09 0000)  GBS:   Unknown 1 hr Glucola : 143mg /dL Genetic Screening: NIPT LR XX Ultrasound: 1st trimester, CUS and serial growth/BPP    Maternal Diabetes: No Genetic Screening: Normal Maternal Ultrasounds/Referrals: Normal Fetal Ultrasounds or other Referrals:  None Maternal Substance Abuse:  No Significant Maternal Medications:  Labetalol  300mg , sertraline  50mg .  Significant Maternal Lab Results:  Rh negative and Other: GBS unknown at this time Other Comments:  None  Medical / Surgical History : Past medical history:  Past Medical History:  Diagnosis Date   Anxiety    Depression    Endometriosis    GERD (gastroesophageal reflux disease)    History of hypothyroidism    per pt dx 2019 meds until 2020, no meds since , normalized   Hyperlipidemia    Hypertension    dr c. schumann/  dr marla. tobb   Migraines    NSVT (nonsustained ventricular tachycardia) [facility]    cardiologist--- dr [place]. schumann   OSA  (obstructive sleep apnea)    followed by dr t. turner;   study done in epic 02-02-20245 moderate , (09-04-2022  per pt did get titrated for cpap but now waiting on medical supply company)   PCOS (polycystic ovarian syndrome)    Pre-diabetes    Wears contact lenses     Past surgical history:  Past Surgical History:  Procedure Laterality Date   BREAST REDUCTION SURGERY Bilateral 12/21/2021   Procedure: MAMMARY REDUCTION  (BREAST) POSSIBLE LIPOSUCTION;  Surgeon: Lowery Estefana RAMAN, DO;  Location: [facility];  Service: Plastics;  Laterality: Bilateral;   DILATION AND EVACUATION N/A 09/06/2022   Procedure: DILATATION AND EVACUATION (D&E) 2ND TRIMESTER;  Surgeon: Gorge Ade, MD;  Location: [facility];  Service: Gynecology;  Laterality: N/A;   OPERATIVE ULTRASOUND N/A 09/06/2022   Procedure: OPERATIVE ULTRASOUND;  Surgeon: Gorge Ade, MD;  Location[facility];  Service: Gynecology;  Laterality: N/A;   ROBOTIC ASSISTED DIAGNOSTIC LAPAROSCOPY  09/27/2017   @ [facility] by dr a. simon;  EXCISION ENDOMETRIOSIS/ BILATERAL OVARIAN WEDGE RESECTION/  CHROMPERTUBATION/  HYSTEROSCOPIC UTERINE SEPTUM RESECTION W/ VERSAPOINT    Family History:  Family History  Problem Relation Age of Onset   Cancer Mother        non-hodgkins lymphoma   Hyperlipidemia Father    Hypertension Father    Anxiety disorder Sister    Depression Sister    Bipolar disorder Sister    Cancer Maternal Grandmother        hodgkins lymphoma   COPD Paternal Grandmother    Diabetes Paternal Grandmother  Obesity Paternal Grandfather     Social History:  reports that she has never smoked. She has never used smokeless tobacco. She reports that she does not currently use alcohol. She reports that she does not use drugs.  Allergies: Imitrex [sumatriptan]   Current Medications at time of admission:  Medications Prior to Admission  Medication Sig Dispense Refill Last Dose/Taking    aspirin  EC 81 MG tablet Take 81 mg by mouth daily. Swallow whole.   03/05/2024 Bedtime   famotidine  (PEPCID ) 20 MG tablet Take 40 mg by mouth at bedtime.   03/05/2024 Bedtime   labetalol  (NORMODYNE ) 300 MG tablet Take 1 tablet (300 mg total) by mouth 3 (three) times daily. 30 tablet 0 03/05/2024 at  9:30 PM   Lactobacillus (AZO VAGINAL HEALTH PROBIOTIC PO) Take by mouth.   03/05/2024 Bedtime   Prenatal Vit-Fe Fumarate-FA (PRENATAL MULTIVITAMIN) TABS tablet Take 1 tablet by mouth daily at 12 noon.   03/05/2024 Bedtime   sertraline  (ZOLOFT ) 100 MG tablet Take 1 tablet by mouth daily. 30 tablet 0 03/05/2024 Bedtime   ferrous sulfate 325 (65 FE) MG EC tablet Take 325 mg by mouth 3 (three) times daily with meals.        Review of Systems: Review of Systems  Constitutional:  Negative for chills and fever.  Eyes:  Negative for blurred vision, double vision and photophobia.  Respiratory:  Negative for cough, shortness of breath and wheezing.   Cardiovascular:  Negative for chest pain and palpitations.  Gastrointestinal:  Negative for heartburn, nausea and vomiting.  Neurological:  Positive for headaches. Negative for tingling, tremors, sensory change, seizures and weakness.  Psychiatric/Behavioral:  Negative for depression. The patient is nervous/anxious.     Physical Exam: Vital signs and nursing notes reviewed.  Patient Vitals for the past 24 hrs:  BP Temp Temp src Pulse Resp SpO2 Height Weight  03/06/24 0620 (!) 146/96 98.9 F (37.2 C) Oral -- 18 99 % -- --  03/06/24 0600 (!) 158/102 -- -- 98 -- -- -- --  03/06/24 0500 (!) 162/94 -- -- 100 -- 99 % -- --  03/06/24 0445 (!) 150/92 -- -- 97 -- 99 % -- --  03/06/24 0430 (!) 144/88 -- -- (!) 105 -- 99 % -- --  03/06/24 0415 (!) 141/80 -- -- 95 -- 98 % -- --  03/06/24 0400 (!) 157/98 -- -- (!) 107 -- 98 % -- --  03/06/24 0344 (!) 142/86 -- -- 99 -- -- -- --  03/06/24 0331 (!) 163/99 -- -- 95 -- 98 % -- --  03/06/24 0315 (!) 157/92 -- -- 93 -- 98 %  -- --  03/06/24 0302 (!) 153/91 -- -- (!) 105 -- -- -- --  03/06/24 0301 -- -- -- -- -- 99 % -- --  03/06/24 0256 -- -- -- -- -- 100 % -- --  03/06/24 0251 -- -- -- -- -- 100 % -- --  03/06/24 0246 (!) 155/103 -- -- 100 -- 100 % -- --  03/06/24 0231 (!) 141/92 -- -- 95 -- 100 % -- --  03/06/24 0230 (!) 141/92 -- -- 95 -- 100 % -- --  03/06/24 0215 138/82 -- -- 98 -- 98 % -- --  03/06/24 0200 133/82 -- -- 98 -- 98 % -- --  03/06/24 0145 (!) 140/84 -- -- (!) 104 -- 98 % -- --  03/06/24 0130 (!) 140/84 -- -- (!) 102 -- 98 % -- --  03/06/24 0101 (!) 141/93 -- --  91 -- 98 % -- --  03/06/24 0100 (!) 141/93 -- -- 91 -- 98 % -- --  03/06/24 0048 (!) 143/99 -- -- 93 -- 98 % -- --  03/06/24 0018 (!) 148/101 98.6 F (37 C) Oral 84 18 -- 5' 3 (1.6 m) 102.8 kg  03/06/24 0017 -- -- -- -- -- 100 % -- --     General: AAO x 3, NAD Heart: RRR Lungs:CTAB Abdomen: Gravid, NT Extremities: Mild edema SVE: Dilation: 3 Effacement (%): 60 Station: -3 Presentation: Vertex Exam by:: Dashaun Onstott, CNM   FHR: 150 BPM, moderate variability, 15x15 accels, no decels TOCO: Contractions   Labs:   Recent Labs    03/03/24 0746 03/06/24 0138  WBC 10.7* 13.1*  HGB 10.2* 9.6*  HCT 31.8* 30.0*  PLT 252 235      Latest Ref Rng & Units 03/06/2024    1:38 AM 03/03/2024    7:46 AM 02/28/2024    6:08 PM  CMP  Glucose 70 - 99 mg/dL 92  91  94   BUN 6 - 20 mg/dL 9  8  9    Creatinine 0.44 - 1.00 mg/dL 9.30  9.44  9.35   Sodium 135 - 145 mmol/L 134  136  135   Potassium 3.5 - 5.1 mmol/L 4.0  4.0  3.9   Chloride 98 - 111 mmol/L 105  104  104   CO2 22 - 32 mmol/L 21  22  21    Calcium 8.9 - 10.3 mg/dL 8.8  9.1  8.6   Total Protein 6.5 - 8.1 g/dL 5.9  6.2  5.5   Total Bilirubin 0.0 - 1.2 mg/dL 0.6  0.3  0.3   Alkaline Phos 38 - 126 U/L 124  126  112   AST 15 - 41 U/L 14  12  13    ALT 0 - 44 U/L 10  11  9      Assessment/Plan: 33 y.o. H4E7977 at [redacted]w[redacted]d presenting for IOL d/t essential HTN now with superimposed  PEC. She reports minimal HA overnight, no symptoms now.   -Last BP 146/96, HTN protocol in place.   - Labs: Drop in hgb from 10.2 on 12/1 to 9.6, plan Venofer  pptm.   -Hypothyroid, no meds at this time.  -Anxiety present, sertraline  50mg  daily, has been a emotional pregnancy with constant worry d/t BP and frequent MAU visit. Plan EPDS prior to d/c.  -GBS unknown d/t early dating, PCN in labor.  -Reviewed IOL/ augmentation with pt and spouse, plan pitocin  and AROM after abx if/when indicated.     Fetal wellbeing - FHT category 1 EFW 6lbs 6oz.   Labor: Early  GBS unknown, PCN upon admission. Rubella immune. Rh negative  Pain control: desires epidural as desired.  Analgesia/anesthesia PRN  Anticipated MOD: NSVD  Plans to breastfeed. POC discussed with patient and support team, all questions answered.  Dr Barbette notified of admission/plan of care.  Sherrilee KANDICE Both CNM, MSN 03/06/2024, 7:32 AM

## 2024-03-06 NOTE — MAU Provider Note (Signed)
 History     CSN: [id number]  Arrival date and time: 03/06/24 0000 First Provider Initiated Contact with Patient   Chief Complaint  Patient presents with   Headache   Hypertension    HPI Angelica Spencer is a 33 y.o. H4E7977 at [redacted]w[redacted]d, 04/02/2024, by Last Menstrual Period, who presents to the Maternity Assessment Unit for elevated bp.   Patient developed worsening HA at dinner tonight, Describes 4/10 behind the eyes without visual changes. This prompted her to check bp after dinner, approx 1900, SBP 150s and repeat similar. She then took bp at bedtime as usual, approx 2200, again 150s. Called on-call service, advised to check again in one hour and present to MAU if still elevated.   Patient has cHTN, was dx gHTN during last pregnancy that did not resolve PP. Was on amlodipine  while not pregnant, now on labetalol  which was increased to 200mg  TID earlier this week. BP was well controlled SBP 110s until a few weeks ago, no consistently 140s/90s. Husband expresses concern about the constant increase in labetalol  vs benefit of delivery. They have IOL scheduled for 37+0.  ROS (+) HA, abd tightening, FM (-) visual changes, SOB, CP, RUQ pain, peripheral edema. VB, LOF   Past Medical History:  Diagnosis Date   Anxiety    Depression    Endometriosis    GERD (gastroesophageal reflux disease)    History of hypothyroidism    per pt dx 2019 meds until 2020, no meds since , normalized   Hyperlipidemia    Hypertension    dr c. schumann/  dr marla. tobb   Migraines    NSVT (nonsustained ventricular tachycardia) [facility])    cardiologist--- dr [place]. schumann   OSA (obstructive sleep apnea)    followed by dr t. turner;   study done in epic 02-02-20245 moderate , (09-04-2022  per pt did get titrated for cpap but now waiting on medical supply company)   PCOS (polycystic ovarian syndrome)    Pre-diabetes    Wears contact lenses    Past Surgical History:  Procedure Laterality Date   BREAST REDUCTION  SURGERY Bilateral 12/21/2021   Procedure: MAMMARY REDUCTION  (BREAST) POSSIBLE LIPOSUCTION;  Surgeon: Lowery Estefana RAMAN, DO;  Location: [facility];  Service: Plastics;  Laterality: Bilateral;   DILATION AND EVACUATION N/A 09/06/2022   Procedure: DILATATION AND EVACUATION (D&E) 2ND TRIMESTER;  Surgeon: Gorge Ade, MD;  Location: [facility];  Service: Gynecology;  Laterality: N/A;   OPERATIVE ULTRASOUND N/A 09/06/2022   Procedure: OPERATIVE ULTRASOUND;  Surgeon: Gorge Ade, MD;  Location: [facility];  Service: Gynecology;  Laterality: N/A;   ROBOTIC ASSISTED DIAGNOSTIC LAPAROSCOPY  09/27/2017   @ [facility] by dr a. simon;  EXCISION ENDOMETRIOSIS/ BILATERAL OVARIAN WEDGE RESECTION/  CHROMPERTUBATION/  HYSTEROSCOPIC UTERINE SEPTUM RESECTION W/ VERSAPOINT   Allergies  Allergen Reactions   Imitrex [Sumatriptan]     Pt reports heart palpitations     Physical Exam  BP (!) 143/99   Pulse 93   Temp 98.6 F (37 C) (Oral)   Resp 18   Ht 5' 3 (1.6 m)   Wt 102.8 kg   LMP 06/27/2023   SpO2 98%   BMI 40.16 kg/m   Gen: alert, no acute distress CV: regular rate Resp: nonlabored Abd: nontender, gravid  FHT Baseline: 140 bpm Variability: Good {> 6 bpm) Accelerations: Reactive Decelerations: Absent Uterine activity: None  Labs --/--/O NEG (06/21 0837)   No results found for this or any previous  visit (from the past 24 hours).   Assessment and Plan  MDM [name] is a 33 y.o. H4E7977 at [redacted]w[redacted]d, 04/02/2024, by Last Menstrual Period, who presents to the MAU for elevated bp. Ddx: elevated blood pressure includes but is not limited to: high risk conditions like preeclampsia or gestational hypertension; chronic hypertension; transient spurious elevated blood pressure. Suspected worsening of hypertensive disease as pregnancy progresses.   Orders Placed This Encounter  Procedures   Urinalysis, Routine w reflex microscopic -Urine, Clean  Catch   CBC   Comprehensive metabolic panel with GFR   Protein / creatinine ratio, urine   Measure blood pressure   Meds ordered this encounter  Medications   acetaminophen  (TYLENOL ) tablet 1,000 mg    Pt feeling reassured after negative preE labs. One severe range bp 163/99, was 140s on 10-min recheck. BP q23min for nearly 4h, plan to monitor for one additional hour. Husband reports patient has had single reading in severe range at home in the last few weeks.  On reassessment, had planned to start nifedipine  with short-interval f/u with her OBGYN, and DC. However, bp at the time I entered the room was 162/94. This is second severe range bp in MAU. Dx now SIPE w/ SF. Recommend IOL.   Dispo: Recommend admission to L&D. Patient discussed with Dr. Barbette and care transferred.    Barabara Maier, DO [facility] [employer]

## 2024-03-06 NOTE — Lactation Note (Signed)
 This note was copied from a baby's chart. Lactation Consultation Note  Patient Name: Angelica Spencer Unijb'd Date: 03/06/2024 Age:31 hours   Attempted to see mom but she was sleeping.  Maternal Data    Feeding    LATCH Score                    Lactation Tools Discussed/Used    Interventions    Discharge    Consult Status      [name] 03/06/2024, 11:56 PM

## 2024-03-06 NOTE — MAU Note (Signed)
..  Angelica Spencer is a 33 y.o. at [redacted]w[redacted]d here in MAU reporting: elevated blood pressures at home of 150's/90's Headache since 1830, has not taken anything for it.  Denies contractions, vaginal bleeding, leaking of fluid. +FM  Pain score: 4/10 Vitals:   03/06/24 0017 03/06/24 0018  BP:  (!) 148/101  Pulse:  84  Resp:  18  Temp:  98.6 F (37 C)  SpO2: 100%      FHT: 135 Lab orders placed from triage:  UA

## 2024-03-07 LAB — COMPREHENSIVE METABOLIC PANEL WITH GFR
ALT: 9 U/L (ref 0–44)
AST: 20 U/L (ref 15–41)
Albumin: 2.1 g/dL — ABNORMAL LOW (ref 3.5–5.0)
Alkaline Phosphatase: 104 U/L (ref 38–126)
Anion gap: 11 (ref 5–15)
BUN: 8 mg/dL (ref 6–20)
CO2: 23 mmol/L (ref 22–32)
Calcium: 8.9 mg/dL (ref 8.9–10.3)
Chloride: 102 mmol/L (ref 98–111)
Creatinine, Ser: 0.62 mg/dL (ref 0.44–1.00)
GFR, Estimated: >60 mL/min (ref >60)
Glucose, Bld: 97 mg/dL (ref 70–99)
Potassium: 4 mmol/L (ref 3.5–5.1)
Sodium: 136 mmol/L (ref 135–145)
Total Bilirubin: 0.6 mg/dL (ref 0.0–1.2)
Total Protein: 5.5 g/dL — ABNORMAL LOW (ref 6.5–8.1)

## 2024-03-07 LAB — CBC
HCT: 26.1 % — ABNORMAL LOW (ref 36.0–46.0)
Hemoglobin: 8.4 g/dL — ABNORMAL LOW (ref 12.0–15.0)
MCH: 26.8 pg (ref 26.0–34.0)
MCHC: 32.2 g/dL (ref 30.0–36.0)
MCV: 83.1 fL (ref 80.0–100.0)
Platelets: 235 K/uL (ref 150–400)
RBC: 3.14 MIL/uL — ABNORMAL LOW (ref 3.87–5.11)
RDW: 15.6 % — ABNORMAL HIGH (ref 11.5–15.5)
WBC: 14.1 K/uL — ABNORMAL HIGH (ref 4.0–10.5)
nRBC: 0 % (ref 0.0–0.2)

## 2024-03-07 LAB — SYPHILIS: RPR W/REFLEX TO RPR TITER AND TREPONEMAL ANTIBODIES, TRADITIONAL SCREENING AND DIAGNOSIS ALGORITHM: RPR Ser Ql: NONREACTIVE

## 2024-03-07 MED ORDER — NIFEDIPINE ER OSMOTIC RELEASE 30 MG PO TB24
30.0000 mg | ORAL_TABLET | Freq: Every day | ORAL | Status: DC
  Administered 2024-03-07 – 2024-03-09 (×3): 30 mg via ORAL
  Filled 2024-03-07 (×3): qty 1

## 2024-03-07 MED ORDER — RHO D IMMUNE GLOBULIN 1500 UNIT/2ML IJ SOSY
300.0000 ug | PREFILLED_SYRINGE | Freq: Once | INTRAMUSCULAR | Status: AC
  Administered 2024-03-07: 300 ug via INTRAVENOUS
  Filled 2024-03-07: qty 2

## 2024-03-07 NOTE — Progress Notes (Signed)
 Post Partum Day 1 S/P induced vaginal  Feeding: breast Subjective: No HA, SOB, CP, F/C, breast symptoms. Normal vaginal bleeding, no clots. Pain controlled.  ambulating without symptoms.  voiding without difficulty patient denies dizziness or heart palpitations.  She denies headache, vision change, right upper quadrant pain  Objective: BP (!) 104/57 (BP Location: Left Arm)   Pulse 84   Temp 98.3 F (36.8 C) (Oral)   Resp 18   Ht 5' 3 (1.6 m)   Wt 102.8 kg   LMP 06/27/2023   SpO2 99%   Breastfeeding Unknown   BMI 40.16 kg/m  I&O reviewed.   Physical Exam:  General: alert, cooperative, and appears stated age 48: appropriate Uterine Fundus: firm DVT Evaluation: No evidence of DVT seen on physical exam. Abdomen: Uterus firm at the umbilicus, no right upper quadrant pain Ext: Trace edema Cardiovascular: Regular rhythm Pulmonary: Clear to auscultation bilaterally Recent Labs    03/06/24 0138 03/07/24 0546  HGB 9.6* 8.4*  HCT 30.0* 26.1*      Assessment/Plan: 33 y.o.  PPD #1 .  normal postpartum exam Continue current postpartum care Ambulate Anemia.  Chronic in pregnancy with exacerbation due to delivery and acute blood loss anemia.  Proceed with plan for IV iron .  Asymptomatic and no indication for blood -Chronic hypertension with gestational exacerbation.  Patient was increased from labetalol  200 to 300 mg every 8 hours earlier this week.  Her blood pressures remain with suboptimal control.  Will add Procardia  which worked well for her postpartum in the past.  If this is controlling her blood pressures would then titrate off the labetalol . - Anticipate discharge home tomorrow   LOS: 1 day   Burnard DELENA Bowers 03/07/2024 9:41 AM

## 2024-03-07 NOTE — Progress Notes (Signed)
 MOB was referred for history of depression/anxiety.  * Referral screened out by Clinical Social Worker because none of the following criteria appear to apply:  ~ History of anxiety/depression during this pregnancy, or of post-partum depression following prior delivery.  ~ Diagnosis of anxiety and/or depression within last 3 years  OR  * MOB's symptoms currently being treated with medication and/or therapy.  Per OB notes, MOB has an active prescription for Sertraline 50mg  for support.  Edinburgh score 0  Please contact the Clinical Social Worker if needs arise, by [facility] request, or if  MOB scores greater than 9/yes to question 10 on Edinburgh Postpartum Depression Screen.  Rosina Molt, [place] Clinical Social Worker [phone]

## 2024-03-07 NOTE — Lactation Note (Addendum)
 This note was copied from a baby's chart. Lactation Consultation Note  Patient Name: Angelica Spencer Date: 03/07/2024 Age:31 hours Reason for consult: Initial assessment;Early term 37-38.6wks;Maternal endocrine disorder  Mother requested LC come back later.  Visitors in room. Lactation to follow up later.   Returned to room to ask about pumping and family was praying.  Passed on to [employer] to inquire about mother's wishes.     Shannon Levorn Lemme  RN, IBCLC 03/07/2024, 11:26 AM

## 2024-03-08 ENCOUNTER — Other Ambulatory Visit (HOSPITAL_COMMUNITY): Payer: Self-pay

## 2024-03-08 LAB — RH IG WORKUP (INCLUDES ABO/RH)
Fetal Screen: NEGATIVE
Gestational Age(Wks): 36.1
Unit division: 0

## 2024-03-08 LAB — GENECONNECT MOLECULAR SCREEN: Genetic Analysis Overall Interpretation: NEGATIVE

## 2024-03-08 MED ORDER — NIFEDIPINE ER 30 MG PO TB24
30.0000 mg | ORAL_TABLET | Freq: Every day | ORAL | 1 refills | Status: AC
  Filled 2024-03-08: qty 30, 30d supply, fill #0

## 2024-03-08 MED ORDER — IBUPROFEN 600 MG PO TABS
600.0000 mg | ORAL_TABLET | Freq: Four times a day (QID) | ORAL | 0 refills | Status: AC
  Filled 2024-03-08: qty 30, 8d supply, fill #0

## 2024-03-08 MED ORDER — LABETALOL HCL 200 MG PO TABS
300.0000 mg | ORAL_TABLET | Freq: Two times a day (BID) | ORAL | Status: DC
  Administered 2024-03-08: 300 mg via ORAL
  Filled 2024-03-08: qty 1

## 2024-03-08 MED ORDER — ACETAMINOPHEN 325 MG PO TABS
650.0000 mg | ORAL_TABLET | ORAL | 1 refills | Status: AC | PRN
  Filled 2024-03-08: qty 30, 3d supply, fill #0

## 2024-03-08 MED ORDER — LABETALOL HCL 300 MG PO TABS
300.0000 mg | ORAL_TABLET | Freq: Two times a day (BID) | ORAL | 1 refills | Status: DC
  Filled 2024-03-08: qty 30, 15d supply, fill #0

## 2024-03-08 NOTE — Hospital Course (Signed)
 Angelica Spencer

## 2024-03-08 NOTE — Progress Notes (Signed)
   PPD #2 S/P NSVD  Live born female  Birth Weight: 5 lb 14.5 oz (2680 g) APGAR: 8, 9  Newborn Delivery   Birth date/time: 03/[birth year]2024 18:52:00 Delivery type: Vaginal, Spontaneous    Baby name: [name]  Delivering provider: ONEAL CLARA G  Lacerations: None  Feeding: breast and bottle  Pain control at delivery: None  S:  Reports feeling well.              Tolerating PO/No nausea or vomiting             Bleeding is light             Pain controlled with acetaminophen  and ibuprofen  (OTC)             Up ad lib/ambulatory/voiding without difficulties   O:  A & O x 3, in no apparent distress  Vitals:   03/08/24 0009 03/08/24 0526 03/08/24 0852 03/08/24 0900  BP: 121/80 122/77 (!) 132/90 130/84  Pulse: 85 78 89 81  Resp:  20 18   Temp:  97.7 F (36.5 C) 97.6 F (36.4 C)   TempSrc:  Oral Oral   SpO2:  100% 100%   Weight:      Height:       Recent Labs    03/06/24 0138 03/07/24 0546  WBC 13.1* 14.1*  HGB 9.6* 8.4*  HCT 30.0* 26.1*  PLT 235 235    Blood type: --/--/O NEG (12/04 0545)  Rubella: Equivocal (06/09 0000)   I&O: No intake/output data recorded.          No intake/output data recorded.  Gen: AAO x 3, NAD Abdomen: soft, non-tender, non-distended Fundus: firm, non-tender, U-2 Perineum: intact Lochia: small Extremities: no edema, no calf pain or tenderness   A/P:  PPD # 2 33 y.o., [id number]  Principal Problem:   Postpartum care following vaginal delivery 12/4  Doing well - stable status  Routine post partum orders Active Problems:   RhD negative  Baby O POS, needs Rhogam prior to discharge   Anxiety and depression  Continue Zoloft     Morbid obesity  Encourage ambulation   Iron  deficiency anemia during pregnancy  S/P IV Venofer  yesterday  Continue PO iron    Chronic hypertension with superimposed preeclampsia  BPs WNL all day yesterday after PO Procardia  in AM, PM dose of labetalol  held for low BP  Denies PEC symptoms  Continue Procardia ,  decrease labetalol  300mg  to BID, hold if low BP  F/U in the office next week for PP BP check   SVD (spontaneous vaginal delivery)  Consider discharge home later today if baby is cleared, otherwise anticipate discharge tomorrow.   Alan MARLA Molt, MSN, CNM 03/08/2024, 10:20 AM

## 2024-03-08 NOTE — Lactation Note (Signed)
 This note was copied from a baby's chart. Lactation Consultation Note  Patient Name: Angelica Spencer Date: 03/08/2024 Age:31 hours Reason for consult: Initial assessment;Late-preterm 34-36.6wks;Infant < 6lbs;Maternal endocrine disorder  P3. Mom had a hard time trying to BF her 1st child now 7 so that she didn't even try w/her 2nd child now 5. Mom stated she is going to give it one more try and if it works it works if it doesn't it doesn't. Mom stated she's not going to let it get her emotionally upset over BF. Mom had reduction from size H to a size D. Mom had breast changes w/this pregnancy. Unable to hand express colostrum at this time. Explained to mom may be d/t the edema in her breast as well. Attempted to get baby to latch. Attempted to soften breast tissue before latching and it was some helpful but nipples were hard and not very compressible. Baby tried to few times to latch then stopped. Mom holding baby on her chest. She is going to use Dr. Jonna bottle from [facility] that was given to mom today. Encouraged mom to wear shells in am. Mom shown how to use DEBP & how to disassemble, clean, & reassemble parts. Mom pumped some while LC in rm. No collection noted but glistening. Mom rubbed it on baby's gums. LPI feeding habits, behavior, STS, I&O, positioning, support reviewed. Mom encouraged to feed baby 8-12 times/24 hours and with feeding cues.  Mom is putting baby on the breast before giving formula. Reviewed time limits for feedings. Mom has been doing good at feeding baby.  Encouraged mom if she has any further questions to call. Recommended OP LC since she had trouble in the past w/BF.   Maternal Data Has patient been taught Hand Expression?: Yes Does the patient have breastfeeding experience prior to this delivery?: Yes  Feeding    LATCH Score Latch: Too sleepy or reluctant, no latch achieved, no sucking elicited.  Audible Swallowing: None  Type of Nipple: Everted  at rest and after stimulation  Comfort (Breast/Nipple): Filling, red/small blisters or bruises, mild/mod discomfort (edema)  Hold (Positioning): Full assist, staff holds infant at breast  LATCH Score: 3   Lactation Tools Discussed/Used Tools: Shells;Pump;Flanges Flange Size: 18 Breast pump type: Double-Electric Breast Pump Pump Education: Setup, frequency, and cleaning;Milk Storage Reason for Pumping: LPI Pumping frequency: q 3hr Pumped volume: 0 mL  Interventions Interventions: Breast feeding basics reviewed;Assisted with latch;Skin to skin;Breast massage;Hand express;Reverse pressure;Breast compression;Adjust position;Support pillows;Position options;Shells;DEBP;Education;LC Services brochure;Infant Driven Feeding Algorithm education;LPT handout/interventions  Discharge Discharge Education: Outpatient recommendation Pump: DEBP (Medela Swing/Spectra )  Consult Status Consult Status: Follow-up Date: 03/08/24 Follow-up type: In-patient    Omaree Fuqua G 03/08/2024, 1:47 AM

## 2024-03-08 NOTE — Lactation Note (Signed)
 This note was copied from a baby's chart. Lactation Consultation Note  Patient Name: Angelica Spencer Unijb'd Date: 03/08/2024 Age:33 years Reason for consult: Follow-up assessment;Late-preterm 34-36.6wks (Infant had change in growth -1.49% to -3.54%, infant is breastfeeding and supplementing with formula.). See MOB:MR- PCOS, CHTN,  and Pre-diabetes.   C/O: infant not been latch well at breast, LPTI, breast and formula feeding.  P3, Per MOB, infant due for feeding and would like latch assistance from [facility]. MOB has not attempt latch infant since latch night, she had hx latch difficulties with her 1st child. MOB was given pillow support and MOB latched infant on her left breast using the cross cradle hold, infant latch sustaining her latch during the feeding and breastfeed for 14 minutes, afterwards, FOB was supplementing infant with 30 mls of 22 kcal formula using lavender Nfant nipple while MOB was pumping. Per MOB, she pumped once but will start pumping every 3 hours to help her milk supply. MOB was expressing colostrum in flanges, expressed 3 mls and still pumping when LC left the room.   Day 2 Feeding plan for LPTI 1- MOB will continue to follow LPTI feeding guidelines and limit chest and bottle feeding to 30 minutes or less, will latch  infant first every feeding for total 15 minutes or less and afterwards continue to supplement infant with any pumped EBM first and then 22 kcal formula. MOB continue to feed infant 8+ times within 24 hours, skin to skin to skin. 2- MOB will continue to use the DEBP every 3 hours for 15 minutes, MOB will offer her EBM first before formula .MOB knows that her EBM is safe for 4 hours at room temperature whereas formula  RTF once open  is safe for 1 hour.  3- MOB continue to follow the LPTI- MY Care Plan and knows on Day 2 to offer (18-25 mls of EBM/22 kcal formula) or more  per feeding.  Discharge education: 1- LC discussed engorgement treatment and  prevention. 2- LC discussed warning signs of dehydration in infant.  3- LC discussed community resources : LC hotline, LC Outpatient  clinic and LC Breastfeeding Support Group.   Maternal Data Does the patient have breastfeeding experience prior to this delivery?: Yes How long did the patient breastfeed?: Per MOB, she attempted with her 1st infant due infant had latch difficulties, MOB did breastfeed her 2nd child,  Feeding Mother's Current Feeding Choice: Breast Milk and Formula Nipple Type: Nfant Slow Flow (purple)  LATCH Score Latch: Grasps breast easily, tongue down, lips flanged, rhythmical sucking.  Audible Swallowing: A few with stimulation  Type of Nipple: Everted at rest and after stimulation (MOB areola edema is resolving and MOB nipples are more evert today)  Comfort (Breast/Nipple): Soft / non-tender  Hold (Positioning): Assistance needed to correctly position infant at breast and maintain latch.  LATCH Score: 8   Lactation Tools Discussed/Used Tools: Pump;Flanges Flange Size: 18 Breast pump type: Double-Electric Breast Pump Pump Education: Setup, frequency, and cleaning;Milk Storage Reason for Pumping: Infant is LPTI Pumping frequency: MOB will continue to pump every 3 hours for 15 minutes. Pumped volume: 3 mL (MOB was still expressing colostrum when LC left the room.)  Interventions Interventions: Assisted with latch;Skin to skin;Breast compression;Adjust position;Support pillows;Position options;Expressed milk;DEBP;Hand pump;Education;Guidelines for Milk Supply and Pumping Schedule Handout  Discharge Discharge Education: Engorgement and breast care;Warning signs for feeding baby Pump: DEBP  Consult Status Consult Status: Complete Date: 03/08/24 Follow-up type: Physician    Grayce LULLA Batter 03/08/2024,  31:48 AM

## 2024-03-09 MED ORDER — MEASLES, MUMPS & RUBELLA VAC ~~LOC~~ SUSR
0.5000 mL | Freq: Once | SUBCUTANEOUS | Status: AC
  Administered 2024-03-09: 0.5 mL via SUBCUTANEOUS
  Filled 2024-03-09: qty 0.5

## 2024-03-09 NOTE — Discharge Summary (Signed)
 OB Discharge Summary  Patient Name: Angelica Spencer DOB: 1991-03-09 MRN: [id number]  Date of admission: 03/06/2024 Delivering provider: ONEAL SHERRILEE MATSU  Admitting diagnosis: Chronic hypertension with superimposed preeclampsia [O11.9] Intrauterine pregnancy: [redacted]w[redacted]d     Secondary diagnosis: Patient Active Problem List   Diagnosis Date Noted   SVD (spontaneous vaginal delivery) 03/08/2024   Postpartum care following vaginal delivery 12/4 03/08/2024   Chronic hypertension with superimposed preeclampsia 03/06/2024   Iron  deficiency anemia during pregnancy 01/08/2024   Morbid obesity (HCC) 05/11/2022   Anxiety and depression 04/13/2022   RhD negative 09/03/2020    Date of discharge: 03/09/2024   Discharge diagnosis: Principal Problem:   Postpartum care following vaginal delivery 12/4 Active Problems:   RhD negative   Anxiety and depression   Morbid obesity (HCC)   Iron  deficiency anemia during pregnancy   Chronic hypertension with superimposed preeclampsia   SVD (spontaneous vaginal delivery)                                                             Augmentation: AROM and Pitocin  Pain control: None Laceration:None Complications: None  Hospital course:  Induction of Labor With Vaginal Delivery   33 y.o. yo [id number] at [redacted]w[redacted]d was admitted to the hospital 03/06/2024 for induction of labor.  Indication for induction: Preeclampsia. Patient had an labor course complicated by preterm IOL for exacerbation of chronic hypertension. She had an uncomplicated labor course. She was not given magnesium sulfate postpartum.  Membrane Rupture Time/Date: 7:22 AM,03/06/2024  Delivery Method:Vaginal, Spontaneous Operative Delivery:N/A Episiotomy: None Lacerations:  None Details of delivery can be found in separate delivery note. Patient had an uncomplicated postpartum course. Her labetalol  was discontinued on the day of discharge and Procardia  was increased to BID. Patient is discharged home  03/09/24. She will follow-up in the office in 1 week for a BP check.   Newborn Data: Birth date:03/06/2024 Birth time:6:52 PM Gender:Female Living status:Living Apgars:8 ,9  Weight:2680 g  Physical exam  Vitals:   03/08/24 2257 03/09/24 0533 03/09/24 0852 03/09/24 1038  BP: (!) 150/90 116/70 117/82 128/85  Pulse: 82 80 83 91  Resp:  18 18   Temp:  97.8 F (36.6 C) (!) 97.5 F (36.4 C)   TempSrc:  Oral Oral   SpO2:  99% 99%   Weight:      Height:       General: alert and cooperative Lochia: appropriate Uterine Fundus: firm Perineum: intact DVT Evaluation: No evidence of DVT seen on physical exam.  Labs: Lab Results  Component Value Date   WBC 14.1 (H) 03/07/2024   HGB 8.4 (L) 03/07/2024   HCT 26.1 (L) 03/07/2024   MCV 83.1 03/07/2024   PLT 235 03/07/2024      Latest Ref Rng & Units 03/07/2024    5:46 AM  CMP  Glucose 70 - 99 mg/dL 97   BUN 6 - 20 mg/dL 8   Creatinine 9.55 - 8.99 mg/dL 9.37   Sodium 864 - 854 mmol/L 136   Potassium 3.5 - 5.1 mmol/L 4.0   Chloride 98 - 111 mmol/L 102   CO2 22 - 32 mmol/L 23   Calcium 8.9 - 10.3 mg/dL 8.9   Total Protein 6.5 - 8.1 g/dL 5.5   Total Bilirubin 0.0 - 1.2 mg/dL 0.6  Alkaline Phos 38 - 126 U/L 104   AST 15 - 41 U/L 20   ALT 0 - 44 U/L 9       03/07/2024   11:55 AM 09/04/2020    6:23 AM  Edinburgh Postnatal Depression Scale Screening Tool  I have been able to laugh and see the funny side of things. 0 0   I have looked forward with enjoyment to things. 0 0   I have blamed myself unnecessarily when things went wrong. 0 0   I have been anxious or worried for no good reason. 0 2   I have felt scared or panicky for no good reason. 0 0   Things have been getting on top of me. 0 1   I have been so unhappy that I have had difficulty sleeping. 0 0   I have felt sad or miserable. 0 0   I have been so unhappy that I have been crying. 0 0   The thought of harming myself has occurred to me. 0 0   Edinburgh Postnatal  Depression Scale Total 0 3      Data saved with a previous flowsheet row definition   Discharge instructions:  per After Visit Summary  After Visit Meds:  Allergies as of 03/09/2024       Reactions   Imitrex [sumatriptan]    Pt reports heart palpitations         Medication List     STOP taking these medications    aspirin  EC 81 MG tablet   AZO VAGINAL HEALTH PROBIOTIC PO   famotidine  20 MG tablet Commonly known as: PEPCID    labetalol  300 MG tablet Commonly known as: NORMODYNE        TAKE these medications    acetaminophen  325 MG tablet Commonly known as: Tylenol  Take 2 tablets (650 mg total) by mouth every 4 (four) hours as needed (for pain scale < 4).   ferrous sulfate 325 (65 FE) MG EC tablet Take 325 mg by mouth 3 (three) times daily with meals.   ibuprofen  600 MG tablet Commonly known as: ADVIL  Take 1 tablet (600 mg total) by mouth every 6 (six) hours.   NIFEdipine  30 MG 24 hr tablet Commonly known as: ADALAT  CC Take 1 tablet (30 mg total) by mouth daily.   prenatal multivitamin Tabs tablet Take 1 tablet by mouth daily at 12 noon.   sertraline  100 MG tablet Commonly known as: ZOLOFT  Take 1 tablet by mouth daily.       Activity: Advance as tolerated. Pelvic rest for 6 weeks.   Newborn Data: Live born female  Birth Weight: 5 lb 14.5 oz (2680 g) APGAR: 8, 9  Newborn Delivery   Birth date/time: 03/[birth year]/2024 18:52:00 Delivery type: Vaginal, Spontaneous    Named [name] Baby Feeding: Bottle Disposition:home with mother  Delivery Report:   Review the Delivery Report for details.    Follow up:  Follow-up Information     Oneal Sherrilee MATSU, CNM. Schedule an appointment as soon as possible for a visit on 03/13/2024.   Specialties: Certified Nurse Midwife, Obstetrics and Gynecology Why: For postpartum blood pressure check. Contact information: [address] [place] [address] [phone]                Alan MARLA Molt,  CNM, MSN 03/09/2024, 2:36 PM

## 2024-03-12 ENCOUNTER — Inpatient Hospital Stay (HOSPITAL_COMMUNITY): Payer: PRIVATE HEALTH INSURANCE

## 2024-03-12 ENCOUNTER — Inpatient Hospital Stay (HOSPITAL_COMMUNITY): Admission: RE | Admit: 2024-03-12 | Payer: Self-pay | Admitting: Obstetrics

## 2024-03-13 ENCOUNTER — Telehealth (HOSPITAL_COMMUNITY): Payer: Self-pay | Admitting: *Deleted

## 2024-03-13 LAB — BIRTH TISSUE RECOVERY COLLECTION (PLACENTA DONATION)

## 2024-03-13 NOTE — Telephone Encounter (Signed)
 03/13/2024  Name: Angelica Spencer MRN: [id number] DOB: [birth year]  Reason for Call:  Transition of Care Hospital Discharge Call  Contact Status: Patient Contact Status: Message  Language assistant needed:          Follow-Up Questions:    Van Postnatal Depression Scale:  In the Past 7 Days:    PHQ2-9 Depression Scale:     Discharge Follow-up:    Post-discharge interventions: NA  Mukesh Kornegay,RN  03/13/2024 1520

## 2024-03-19 ENCOUNTER — Encounter (HOSPITAL_COMMUNITY)
